# Patient Record
Sex: Male | Born: 1940 | Race: White | Hispanic: No | Smoking: Never smoker
Health system: Southern US, Community
[De-identification: ages and names within clinical notes are randomized; demographics above are authoritative.]

## PROBLEM LIST (undated history)

## (undated) DIAGNOSIS — E7800 Pure hypercholesterolemia, unspecified: Secondary | ICD-10-CM

## (undated) DIAGNOSIS — F419 Anxiety disorder, unspecified: Secondary | ICD-10-CM

## (undated) DIAGNOSIS — N200 Calculus of kidney: Secondary | ICD-10-CM

## (undated) DIAGNOSIS — M545 Low back pain, unspecified: Secondary | ICD-10-CM

## (undated) DIAGNOSIS — G8929 Other chronic pain: Secondary | ICD-10-CM

## (undated) DIAGNOSIS — K219 Gastro-esophageal reflux disease without esophagitis: Secondary | ICD-10-CM

## (undated) DIAGNOSIS — K5730 Diverticulosis of large intestine without perforation or abscess without bleeding: Secondary | ICD-10-CM

## (undated) DIAGNOSIS — K279 Peptic ulcer, site unspecified, unspecified as acute or chronic, without hemorrhage or perforation: Secondary | ICD-10-CM

## (undated) DIAGNOSIS — I10 Essential (primary) hypertension: Secondary | ICD-10-CM

## (undated) HISTORY — PX: INGUINAL HERNIA REPAIR: SHX194

## (undated) HISTORY — DX: Low back pain: M54.5

## (undated) HISTORY — DX: Peptic ulcer, site unspecified, unspecified as acute or chronic, without hemorrhage or perforation: K27.9

## (undated) HISTORY — DX: Anxiety disorder, unspecified: F41.9

## (undated) HISTORY — DX: Essential (primary) hypertension: I10

## (undated) HISTORY — PX: LEG SURGERY: SHX1003

## (undated) HISTORY — DX: Gastro-esophageal reflux disease without esophagitis: K21.9

## (undated) HISTORY — DX: Diverticulosis of large intestine without perforation or abscess without bleeding: K57.30

## (undated) HISTORY — DX: Low back pain, unspecified: M54.50

## (undated) HISTORY — DX: Pure hypercholesterolemia, unspecified: E78.00

## (undated) HISTORY — DX: Calculus of kidney: N20.0

## (undated) HISTORY — DX: Other chronic pain: G89.29

---

## 2000-04-14 ENCOUNTER — Encounter (INDEPENDENT_AMBULATORY_CARE_PROVIDER_SITE_OTHER): Payer: Self-pay | Admitting: Specialist

## 2000-04-14 ENCOUNTER — Ambulatory Visit (HOSPITAL_COMMUNITY): Admission: RE | Admit: 2000-04-14 | Discharge: 2000-04-14 | Payer: Self-pay | Admitting: Gastroenterology

## 2000-07-26 ENCOUNTER — Encounter (INDEPENDENT_AMBULATORY_CARE_PROVIDER_SITE_OTHER): Payer: Self-pay | Admitting: Specialist

## 2000-07-26 ENCOUNTER — Ambulatory Visit (HOSPITAL_COMMUNITY): Admission: RE | Admit: 2000-07-26 | Discharge: 2000-07-26 | Payer: Self-pay | Admitting: Gastroenterology

## 2000-08-08 ENCOUNTER — Ambulatory Visit (HOSPITAL_COMMUNITY): Admission: RE | Admit: 2000-08-08 | Discharge: 2000-08-08 | Payer: Self-pay | Admitting: Gastroenterology

## 2000-08-09 ENCOUNTER — Ambulatory Visit (HOSPITAL_COMMUNITY): Admission: RE | Admit: 2000-08-09 | Discharge: 2000-08-09 | Payer: Self-pay | Admitting: Gastroenterology

## 2000-08-09 ENCOUNTER — Encounter: Payer: Self-pay | Admitting: Gastroenterology

## 2002-01-16 ENCOUNTER — Encounter: Payer: Self-pay | Admitting: Urology

## 2002-01-16 ENCOUNTER — Encounter: Admission: RE | Admit: 2002-01-16 | Discharge: 2002-01-16 | Payer: Self-pay | Admitting: Urology

## 2002-11-22 ENCOUNTER — Ambulatory Visit (HOSPITAL_COMMUNITY): Admission: RE | Admit: 2002-11-22 | Discharge: 2002-11-22 | Payer: Self-pay | Admitting: Pulmonary Disease

## 2002-11-22 ENCOUNTER — Encounter: Payer: Self-pay | Admitting: Pulmonary Disease

## 2003-12-17 ENCOUNTER — Ambulatory Visit: Payer: Self-pay | Admitting: Pulmonary Disease

## 2003-12-18 ENCOUNTER — Ambulatory Visit: Payer: Self-pay | Admitting: Pulmonary Disease

## 2004-02-18 ENCOUNTER — Ambulatory Visit: Payer: Self-pay | Admitting: Pulmonary Disease

## 2004-02-28 ENCOUNTER — Ambulatory Visit: Payer: Self-pay | Admitting: Pulmonary Disease

## 2004-03-23 ENCOUNTER — Ambulatory Visit: Payer: Self-pay | Admitting: *Deleted

## 2004-03-27 ENCOUNTER — Ambulatory Visit: Payer: Self-pay | Admitting: Pulmonary Disease

## 2004-04-21 ENCOUNTER — Ambulatory Visit: Payer: Self-pay | Admitting: Pulmonary Disease

## 2004-09-21 ENCOUNTER — Ambulatory Visit: Payer: Self-pay | Admitting: Pulmonary Disease

## 2004-12-14 ENCOUNTER — Ambulatory Visit: Payer: Self-pay | Admitting: Pulmonary Disease

## 2005-03-18 ENCOUNTER — Ambulatory Visit: Payer: Self-pay | Admitting: Pulmonary Disease

## 2005-07-05 ENCOUNTER — Ambulatory Visit: Payer: Self-pay | Admitting: Pulmonary Disease

## 2005-10-08 ENCOUNTER — Ambulatory Visit: Payer: Self-pay | Admitting: Pulmonary Disease

## 2006-04-07 ENCOUNTER — Ambulatory Visit: Payer: Self-pay | Admitting: Pulmonary Disease

## 2006-04-07 LAB — CONVERTED CEMR LAB
ALT: 42 units/L — ABNORMAL HIGH (ref 0–40)
AST: 29 units/L (ref 0–37)
Albumin: 4 g/dL (ref 3.5–5.2)
Alkaline Phosphatase: 49 units/L (ref 39–117)
BUN: 17 mg/dL (ref 6–23)
Basophils Absolute: 0 10*3/uL (ref 0.0–0.1)
Basophils Relative: 0 % (ref 0.0–1.0)
Bilirubin, Direct: 0.2 mg/dL (ref 0.0–0.3)
CO2: 32 meq/L (ref 19–32)
Calcium: 9.4 mg/dL (ref 8.4–10.5)
Chloride: 108 meq/L (ref 96–112)
Cholesterol: 163 mg/dL (ref 0–200)
Creatinine, Ser: 0.8 mg/dL (ref 0.4–1.5)
Eosinophils Absolute: 0.1 10*3/uL (ref 0.0–0.6)
Eosinophils Relative: 1.1 % (ref 0.0–5.0)
GFR calc Af Amer: 124 mL/min
GFR calc non Af Amer: 102 mL/min
Glucose, Bld: 100 mg/dL — ABNORMAL HIGH (ref 70–99)
HCT: 45.4 % (ref 39.0–52.0)
HDL: 48.6 mg/dL (ref >39.0)
Hemoglobin: 15.6 g/dL (ref 13.0–17.0)
LDL Cholesterol: 103 mg/dL — ABNORMAL HIGH (ref 0–99)
Lymphocytes Relative: 30.1 % (ref 12.0–46.0)
MCHC: 34.4 g/dL (ref 30.0–36.0)
MCV: 91.4 fL (ref 78.0–100.0)
Monocytes Absolute: 0.3 10*3/uL (ref 0.2–0.7)
Monocytes Relative: 5.7 % (ref 3.0–11.0)
Neutro Abs: 3.8 10*3/uL (ref 1.4–7.7)
Neutrophils Relative %: 63.1 % (ref 43.0–77.0)
Platelets: 214 10*3/uL (ref 150–400)
Potassium: 4.8 meq/L (ref 3.5–5.1)
RBC: 4.97 M/uL (ref 4.22–5.81)
RDW: 12.6 % (ref 11.5–14.6)
Sodium: 144 meq/L (ref 135–145)
TSH: 1.69 microintl units/mL (ref 0.35–5.50)
Total Bilirubin: 1 mg/dL (ref 0.3–1.2)
Total CHOL/HDL Ratio: 3.4
Total Protein: 6.9 g/dL (ref 6.0–8.3)
Triglycerides: 55 mg/dL (ref 0–149)
VLDL: 11 mg/dL (ref 0–40)
WBC: 6 10*3/uL (ref 4.5–10.5)

## 2006-05-18 ENCOUNTER — Ambulatory Visit: Payer: Self-pay | Admitting: Pulmonary Disease

## 2006-06-02 HISTORY — PX: ROTATOR CUFF REPAIR: SHX139

## 2006-06-15 ENCOUNTER — Encounter: Admission: RE | Admit: 2006-06-15 | Discharge: 2006-08-19 | Payer: Self-pay | Admitting: Orthopedic Surgery

## 2006-10-04 ENCOUNTER — Ambulatory Visit: Payer: Self-pay | Admitting: Pulmonary Disease

## 2006-11-22 DIAGNOSIS — J309 Allergic rhinitis, unspecified: Secondary | ICD-10-CM | POA: Insufficient documentation

## 2006-11-22 DIAGNOSIS — E785 Hyperlipidemia, unspecified: Secondary | ICD-10-CM

## 2006-11-22 DIAGNOSIS — K219 Gastro-esophageal reflux disease without esophagitis: Secondary | ICD-10-CM

## 2006-11-22 DIAGNOSIS — D126 Benign neoplasm of colon, unspecified: Secondary | ICD-10-CM

## 2006-11-22 DIAGNOSIS — N200 Calculus of kidney: Secondary | ICD-10-CM

## 2006-11-22 DIAGNOSIS — Z8711 Personal history of peptic ulcer disease: Secondary | ICD-10-CM

## 2006-11-22 DIAGNOSIS — F411 Generalized anxiety disorder: Secondary | ICD-10-CM | POA: Insufficient documentation

## 2006-11-22 DIAGNOSIS — M545 Low back pain: Secondary | ICD-10-CM

## 2006-11-22 DIAGNOSIS — I10 Essential (primary) hypertension: Secondary | ICD-10-CM | POA: Insufficient documentation

## 2007-05-02 ENCOUNTER — Ambulatory Visit: Payer: Self-pay | Admitting: Pulmonary Disease

## 2007-05-02 DIAGNOSIS — K5730 Diverticulosis of large intestine without perforation or abscess without bleeding: Secondary | ICD-10-CM | POA: Insufficient documentation

## 2007-05-02 DIAGNOSIS — R0789 Other chest pain: Secondary | ICD-10-CM

## 2007-05-17 ENCOUNTER — Telehealth: Payer: Self-pay | Admitting: Pulmonary Disease

## 2007-05-28 LAB — CONVERTED CEMR LAB
Albumin: 3.9 g/dL (ref 3.5–5.2)
BUN: 23 mg/dL (ref 6–23)
Basophils Relative: 0.7 % (ref 0.0–1.0)
Calcium: 9.6 mg/dL (ref 8.4–10.5)
Creatinine, Ser: 0.7 mg/dL (ref 0.4–1.5)
Eosinophils Absolute: 0.1 10*3/uL (ref 0.0–0.7)
Eosinophils Relative: 1.7 % (ref 0.0–5.0)
GFR calc Af Amer: 144 mL/min
GFR calc non Af Amer: 119 mL/min
Glucose, Bld: 95 mg/dL (ref 70–99)
HCT: 46.6 % (ref 39.0–52.0)
HDL: 37.9 mg/dL — ABNORMAL LOW (ref >39.0)
Hemoglobin: 15.5 g/dL (ref 13.0–17.0)
MCV: 92 fL (ref 78.0–100.0)
Monocytes Absolute: 0.5 10*3/uL (ref 0.1–1.0)
Monocytes Relative: 8.5 % (ref 3.0–12.0)
Neutro Abs: 3.7 10*3/uL (ref 1.4–7.7)
Platelets: 202 10*3/uL (ref 150–400)
Potassium: 4.1 meq/L (ref 3.5–5.1)
TSH: 1.75 microintl units/mL (ref 0.35–5.50)
Total Protein: 7.1 g/dL (ref 6.0–8.3)
Triglycerides: 33 mg/dL (ref 0–149)
WBC: 6.2 10*3/uL (ref 4.5–10.5)

## 2007-06-17 ENCOUNTER — Encounter: Payer: Self-pay | Admitting: Pulmonary Disease

## 2007-06-19 ENCOUNTER — Telehealth: Payer: Self-pay | Admitting: Pulmonary Disease

## 2007-06-20 ENCOUNTER — Ambulatory Visit: Payer: Self-pay | Admitting: Pulmonary Disease

## 2007-06-23 ENCOUNTER — Telehealth: Payer: Self-pay | Admitting: Pulmonary Disease

## 2007-06-23 LAB — CONVERTED CEMR LAB
Basophils Absolute: 0 10*3/uL (ref 0.0–0.1)
Basophils Relative: 0.9 % (ref 0.0–1.0)
CO2: 30 meq/L (ref 19–32)
Calcium: 9.1 mg/dL (ref 8.4–10.5)
Chloride: 110 meq/L (ref 96–112)
Creatinine, Ser: 0.7 mg/dL (ref 0.4–1.5)
Glucose, Bld: 97 mg/dL (ref 70–99)
Hemoglobin: 12.6 g/dL — ABNORMAL LOW (ref 13.0–17.0)
Iron: 60 ug/dL (ref 42–165)
Lymphocytes Relative: 26.5 % (ref 12.0–46.0)
MCHC: 33.8 g/dL (ref 30.0–36.0)
Monocytes Relative: 8.4 % (ref 3.0–12.0)
Neutro Abs: 3.2 10*3/uL (ref 1.4–7.7)
Neutrophils Relative %: 61.4 % (ref 43.0–77.0)
RBC: 4.03 M/uL — ABNORMAL LOW (ref 4.22–5.81)
Transferrin: 238.1 mg/dL (ref >=212.0)
WBC: 5.1 10*3/uL (ref 4.5–10.5)

## 2007-07-21 ENCOUNTER — Telehealth (INDEPENDENT_AMBULATORY_CARE_PROVIDER_SITE_OTHER): Payer: Self-pay | Admitting: *Deleted

## 2007-07-21 ENCOUNTER — Ambulatory Visit: Payer: Self-pay | Admitting: Pulmonary Disease

## 2007-07-21 LAB — CONVERTED CEMR LAB
Basophils Relative: 0.2 % (ref 0.0–1.0)
Eosinophils Relative: 1.3 % (ref 0.0–5.0)
Hemoglobin: 13.8 g/dL (ref 13.0–17.0)
Lymphocytes Relative: 32.3 % (ref 12.0–46.0)
MCV: 93.8 fL (ref 78.0–100.0)
Neutro Abs: 3.4 10*3/uL (ref 1.4–7.7)
Neutrophils Relative %: 59.7 % (ref 43.0–77.0)
RBC: 4.42 M/uL (ref 4.22–5.81)
WBC: 5.8 10*3/uL (ref 4.5–10.5)

## 2007-08-23 ENCOUNTER — Telehealth (INDEPENDENT_AMBULATORY_CARE_PROVIDER_SITE_OTHER): Payer: Self-pay | Admitting: *Deleted

## 2007-08-24 ENCOUNTER — Encounter: Payer: Self-pay | Admitting: Adult Health

## 2007-08-24 ENCOUNTER — Ambulatory Visit: Payer: Self-pay | Admitting: Internal Medicine

## 2007-09-06 ENCOUNTER — Ambulatory Visit: Payer: Self-pay | Admitting: Internal Medicine

## 2007-09-07 ENCOUNTER — Ambulatory Visit: Payer: Self-pay | Admitting: Pulmonary Disease

## 2007-09-07 ENCOUNTER — Encounter: Admission: RE | Admit: 2007-09-07 | Discharge: 2007-09-07 | Payer: Self-pay | Admitting: Pulmonary Disease

## 2007-10-05 ENCOUNTER — Ambulatory Visit: Payer: Self-pay | Admitting: Pulmonary Disease

## 2008-02-16 ENCOUNTER — Encounter: Payer: Self-pay | Admitting: Pulmonary Disease

## 2008-02-29 ENCOUNTER — Encounter: Payer: Self-pay | Admitting: Pulmonary Disease

## 2008-03-01 ENCOUNTER — Telehealth: Payer: Self-pay | Admitting: Pulmonary Disease

## 2008-04-03 ENCOUNTER — Ambulatory Visit: Payer: Self-pay | Admitting: Pulmonary Disease

## 2008-04-06 LAB — CONVERTED CEMR LAB
ALT: 33 units/L (ref 0–53)
AST: 28 units/L (ref 0–37)
Albumin: 3.9 g/dL (ref 3.5–5.2)
Alkaline Phosphatase: 54 units/L (ref 39–117)
BUN: 19 mg/dL (ref 6–23)
Basophils Relative: 0.2 % (ref 0.0–3.0)
CO2: 31 meq/L (ref 19–32)
Chloride: 108 meq/L (ref 96–112)
Eosinophils Absolute: 0.1 10*3/uL (ref 0.0–0.7)
Eosinophils Relative: 1.9 % (ref 0.0–5.0)
GFR calc non Af Amer: 142 mL/min
Glucose, Bld: 108 mg/dL — ABNORMAL HIGH (ref 70–99)
HDL: 39.7 mg/dL (ref >39.0)
Lymphocytes Relative: 31.2 % (ref 12.0–46.0)
Monocytes Relative: 7.8 % (ref 3.0–12.0)
Neutrophils Relative %: 58.9 % (ref 43.0–77.0)
Platelets: 194 10*3/uL (ref 150–400)
Potassium: 4.5 meq/L (ref 3.5–5.1)
RBC: 5.02 M/uL (ref 4.22–5.81)
TSH: 1.73 microintl units/mL (ref 0.35–5.50)
Total CHOL/HDL Ratio: 3.7
VLDL: 6 mg/dL (ref 0–40)
WBC: 5.4 10*3/uL (ref 4.5–10.5)

## 2008-04-19 ENCOUNTER — Telehealth (INDEPENDENT_AMBULATORY_CARE_PROVIDER_SITE_OTHER): Payer: Self-pay | Admitting: *Deleted

## 2008-04-19 DIAGNOSIS — M25559 Pain in unspecified hip: Secondary | ICD-10-CM

## 2008-07-04 ENCOUNTER — Ambulatory Visit: Payer: Self-pay | Admitting: Pulmonary Disease

## 2008-10-01 ENCOUNTER — Ambulatory Visit: Payer: Self-pay | Admitting: Pulmonary Disease

## 2009-03-21 ENCOUNTER — Encounter: Payer: Self-pay | Admitting: Pulmonary Disease

## 2009-03-26 ENCOUNTER — Ambulatory Visit: Payer: Self-pay | Admitting: Pulmonary Disease

## 2009-03-26 ENCOUNTER — Encounter: Payer: Self-pay | Admitting: Adult Health

## 2009-03-31 ENCOUNTER — Ambulatory Visit: Payer: Self-pay | Admitting: Pulmonary Disease

## 2009-04-01 LAB — CONVERTED CEMR LAB
Basophils Relative: 0.8 % (ref 0.0–3.0)
Bilirubin, Direct: 0.1 mg/dL (ref 0.0–0.3)
Calcium: 9.4 mg/dL (ref 8.4–10.5)
Chloride: 107 meq/L (ref 96–112)
Creatinine, Ser: 0.6 mg/dL (ref 0.4–1.5)
Eosinophils Absolute: 0.1 10*3/uL (ref 0.0–0.7)
Eosinophils Relative: 1.2 % (ref 0.0–5.0)
GFR calc non Af Amer: 141.29 mL/min (ref >60)
HCT: 45.2 % (ref 39.0–52.0)
HDL: 53.7 mg/dL (ref >39.00)
Hemoglobin: 14.9 g/dL (ref 13.0–17.0)
LDL Cholesterol: 139 mg/dL — ABNORMAL HIGH (ref 0–99)
MCHC: 33.1 g/dL (ref 30.0–36.0)
MCV: 93.6 fL (ref 78.0–100.0)
Monocytes Absolute: 0.4 10*3/uL (ref 0.1–1.0)
Neutro Abs: 3 10*3/uL (ref 1.4–7.7)
RBC: 4.82 M/uL (ref 4.22–5.81)
Total Bilirubin: 0.7 mg/dL (ref 0.3–1.2)
Total CHOL/HDL Ratio: 4
Triglycerides: 34 mg/dL (ref 0.0–149.0)
VLDL: 6.8 mg/dL (ref 0.0–40.0)
WBC: 5.4 10*3/uL (ref 4.5–10.5)

## 2009-04-02 ENCOUNTER — Ambulatory Visit (HOSPITAL_COMMUNITY): Admission: RE | Admit: 2009-04-02 | Discharge: 2009-04-02 | Payer: Self-pay | Admitting: Gastroenterology

## 2009-04-25 ENCOUNTER — Encounter: Payer: Self-pay | Admitting: Pulmonary Disease

## 2009-05-21 ENCOUNTER — Telehealth: Payer: Self-pay | Admitting: Pulmonary Disease

## 2009-07-03 ENCOUNTER — Ambulatory Visit: Payer: Self-pay | Admitting: Pulmonary Disease

## 2009-08-22 ENCOUNTER — Encounter: Payer: Self-pay | Admitting: Pulmonary Disease

## 2009-09-01 HISTORY — PX: TOTAL SHOULDER ARTHROPLASTY: SHX126

## 2009-09-11 ENCOUNTER — Ambulatory Visit (HOSPITAL_COMMUNITY): Admission: RE | Admit: 2009-09-11 | Discharge: 2009-09-12 | Payer: Self-pay | Admitting: Orthopedic Surgery

## 2009-12-05 ENCOUNTER — Ambulatory Visit: Payer: Self-pay | Admitting: Pulmonary Disease

## 2009-12-07 LAB — CONVERTED CEMR LAB
ALT: 22 units/L (ref 0–53)
Albumin: 4.1 g/dL (ref 3.5–5.2)
Alkaline Phosphatase: 77 units/L (ref 39–117)
Bilirubin, Direct: 0.1 mg/dL (ref 0.0–0.3)
CO2: 29 meq/L (ref 19–32)
Calcium: 9.3 mg/dL (ref 8.4–10.5)
Chloride: 107 meq/L (ref 96–112)
Creatinine, Ser: 0.6 mg/dL (ref 0.4–1.5)
Glucose, Bld: 94 mg/dL (ref 70–99)
HDL: 40.9 mg/dL (ref >39.00)
Sodium: 143 meq/L (ref 135–145)
Total CHOL/HDL Ratio: 4
Total Protein: 7 g/dL (ref 6.0–8.3)

## 2010-01-23 ENCOUNTER — Observation Stay (HOSPITAL_COMMUNITY)
Admission: EM | Admit: 2010-01-23 | Discharge: 2010-01-23 | Payer: Self-pay | Source: Home / Self Care | Admitting: Emergency Medicine

## 2010-01-23 ENCOUNTER — Encounter (INDEPENDENT_AMBULATORY_CARE_PROVIDER_SITE_OTHER): Payer: Self-pay | Admitting: Emergency Medicine

## 2010-02-05 ENCOUNTER — Ambulatory Visit
Admission: RE | Admit: 2010-02-05 | Discharge: 2010-02-05 | Payer: Self-pay | Source: Home / Self Care | Attending: Pulmonary Disease | Admitting: Pulmonary Disease

## 2010-02-05 DIAGNOSIS — R42 Dizziness and giddiness: Secondary | ICD-10-CM | POA: Insufficient documentation

## 2010-03-03 NOTE — Assessment & Plan Note (Signed)
Summary: 6 months/apc   CC:  8 month ROV & review of mult medical problems....  History of Present Illness: 70 y/o WM here for a follow up visit... he has mult med probs as noted below...    ~  Aug09: seen w/ left chest discomfort, mult somatic complaints, and anxiety... he thought his left breast was larger than the right & tender on palp- and he wanted eval... subseq CXR, Mammogram, & Sonar were all WNL.Marland Kitchen. he feels good about the thorough eval and all symptoms have resolved...  ~  Sep09: he has had a diverticular bleed and was adm to [facility] 5/14 - 06/17/07... this bleed was self-limited and he didn't require transfusion (Hg 11-12 range)... he was also found to have pos HPylori serology and treated w/ PrevPak x 14d... f/u w/ [name]- no change in GI treatment...   ~  Mar10:  he saw [facility] in Jan w/ dysphagia- repeat EGD showed HH, erosive esophagitis & he was dilated & placed on Prevacid Bid- improved... feels well overall but still somewhat fixated on his breast stating that his right breast itches some- no nodules, no mass, etc... repeat breast exam is totally neg... I suggested thaty he increase his Prn Alprazolam...    ~  March 31, 2009:  recent eval by [facility] for vague abd symptoms- prev Sonar neg & HIDA planned... had superfic infection around navel treated w/ Keflex/ cortisporin & improved... BP stable on Rx;  Chol has been well controlled on Simva40;  he tells me he will cont to f/u DrRDavis at [facility]...   ~  December 05, 2009:  he fell while standing in the bed of his truck 8/11 w/ left humerus fx & surg [name]- lots of rehab & still w/ decr ROM (he's left handed)... BP controlled;  remains on diet & Simva40 for Chol;  GI has been stable;  and Alpraz helps nerves...  OK Flu vaccine today & check fasting labs.    Current Problem List:  ALLERGIC RHINITIS (ICD-477.9) - uses OTC meds Prn...  HYPERTENSION (ICD-401.9) - on LOTREL 10-20/d... BP=116/72 today and similar at home as  well... denies HA, fatigue, visual changes, CP, palipit, dizziness, syncope, dyspnea, edema, etc...  CHEST PAIN, ATYPICAL (ICD-786.59) - hx CP and tachy palpit in 2003 w/ neg cardiac eval from [name]... he is on ASA 81mg /d, and asymptomatic currently.  ~  NuclearStressTest 6/03 showed no infarct, ? mild ischemia inferiorly, EF= 57%...  ~  [name] was negative without stress induced wall motion abn...  HYPERCHOLESTEROLEMIA (ICD-272.0) - on SIMVASTATIN 40mg /d + FISH OIL 1000mg /d...  ~  FLP 3/09 showed TChol 157, TG 33, HDL 38, LDL 113  ~  FLP 3/10 showed TChol 145, TG 32, HDL 40, LDL 99  ~  FLP 2/11 showed TChol 199, TG 34, HDL 54, LDL 139... rec> same med, better diet, get wt down.  ~  FLP 11/11 showed TChol 168, TG 50, HDL 41, LDL 117  GERD (ICD-530.81) & PUD, HX OF (ICD-V12.71) - on PREVACID 30mg Bid now ... followed by [facility] for GI- as above.  ~  EGD 2004 showed large HH & schatzki ring...  ~  AbdSonar 10/04 was WNL...  ~  EDG 1/10 repeated for dysphagia w/ HH, schatzki ring, erosive esophagitis...dilated & PPI incr to Bid...  DIVERTICULOSIS OF COLON (ICD-562.10) & COLONIC POLYPS (ICD-211.3) - GI = [name] and pt reports seen 3/09 w/ colonoscopy showing 4 polyps... f/u planned 68yrs.  ~  adm to [name] 5/09 w/ lower  GI bleed likely from [employer]- self-limited, didn't req Tx etc...  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  ~  pt reports PSA 3/10 was <2  ~  labs here 2/11 showed PSA= 1.13  OSTEOARTHRITIS & ORTHOPEDIC ISSUES: LOW BACK PAIN, CHRONIC (ICD-724.2) - he has a hx of LBP and FM-like symptoms... states doing well w/ exercise program.  ~  8/11:  fall from bed of truck w/ left prox humerus fx & surg [name]- lots of rehab but still has decr ROM shoulder.  ANXIETY (ICD-300.00) - takes ALPRAZOLAM 0.5mg  Prn...  Health Maintenance - up to date on colonoscopy and prostate evals... he had TETANUS shot 2003, and PNEUMOVAX at  age 62 in 2003- repeat PNEUMOVAX given 2/11 at age 70... he gets yearly Flu shots as well...   Preventive Screening-Counseling & Management  Alcohol-Tobacco     Smoking Status: never  Allergies (verified): No Known Drug Allergies  Comments:  Nurse/Medical Assistant: The patient's medications and allergies were reviewed with the patient and were updated in the Medication and Allergy Lists.  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 father died at 84 from CHF mother died at 44 from pneumonia 4 siblings all in good health  Social History: Reviewed history from 10/05/2007 and no changes required. never smoked does not exercise 1 cup of coffee a day married 4 children no etoh  Review of Systems      See HPI       The patient complains of dyspnea on exertion.  The patient denies anorexia, fever, weight loss, weight gain, vision loss, decreased hearing, hoarseness, chest pain, syncope, peripheral edema, prolonged cough, headaches, hemoptysis, abdominal pain, melena, hematochezia, severe indigestion/heartburn, hematuria, incontinence, muscle weakness, suspicious skin lesions, transient blindness, difficulty walking, depression, unusual weight change, abnormal bleeding, enlarged lymph nodes, and angioedema.         His CC= left shoulder discomfort...  Vital Signs:  Patient profile:   70 year old male Height:      69  inches Weight:      162.25 pounds BMI:     24.05 O2 Sat:      96 % on Room air Temp:     97.2 degrees F oral Pulse rate:   65 / minute BP sitting:   116 / 72  (right arm) Cuff size:   regular  Vitals Entered By: Randell Loop CMA (December 05, 2009 11:22 AM)  O2 Sat at Rest %:  96 O2 Flow:  Room air CC: 8 month ROV & review of mult medical problems... Is Patient Diabetic? No Pain Assessment Patient in pain? yes      Onset of pain  with the left shoulder at times Comments meds updated today with pt   Physical Exam  Additional Exam:  WD, WN, 70 y/o WM in NAD... GENERAL:  Alert & oriented; pleasant & cooperative... HEENT:  [place]/AT, EOM-full, PERRLA, EACs-clear, TMs-wnl, NOSE-clear, THROAT-clear & wnl. NECK:  Supple w/ fairROM; no JVD; normal carotid impulses w/o bruits; no  thyromegaly or nodules palpated; no lymphadenopathy. CHEST:  Clear to P & A; without wheezes/ rales/ or rhonchi heard...  HEART:  Regular Rhythm; without murmurs/ rubs/ or gallops detected... ABDOMEN:  Soft & nontender; normal bowel sounds; no organomegaly or masses palpated... EXT: without deformities, mild arthritic changes; no varicose veins/ venous insuffic/ or edema. NEURO:  CN's intact; motor testing normal; sensory testing normal; gait normal & balance OK. DERM:  No lesions noted; periumbil rash improved...    MISC. Report  Procedure date:  12/05/2009  Findings:      BMP (METABOL)   Sodium                    143 mEq/L                   135-145   Potassium                 4.4 mEq/L                   3.5-5.1   Chloride                  107 mEq/L                   96-112   Carbon Dioxide            29 mEq/L                    19-32   Glucose                   94 mg/dL                    16-10   BUN                  [H]  25 mg/dL                    9-60   Creatinine                0.6 mg/dL                   4.5-4.0   Calcium                   9.3 mg/dL                   9.8-11.9   GFR                        135.78 mL/min               >60  Hepatic/Liver Function Panel (HEPATIC)   Total Bilirubin           0.6 mg/dL                   1.4-7.8   Direct Bilirubin          0.1 mg/dL                   2.9-5.6   Alkaline Phosphatase      77 U/L                      39-117   AST  19 U/L                      0-37   ALT                       22 U/L                      0-53   Total Protein             7.0 g/dL                    2.7-2.5   Albumin                   4.1 g/dL                    3.6-6.4  Lipid Panel (LIPID)   Cholesterol               168 mg/dL                   4-034   Triglycerides             50.0 mg/dL                  7.4-259.5   HDL                       63.87 mg/dL                 >56.43   LDL Cholesterol      [H]  329 mg/dL                   5-18   Impression & Recommendations:  Problem # 1:  LEFT SHOULDER PAIN>>> s/p fall w/ prox humerus fx 7 surg by [name]... continue the PT, using Tylenol, and he will let us know if he needs stronger pain med...  Problem # 2:  HYPERTENSION (ICD-401.9) Controlled>  same meds. His updated medication list for this problem includes:    Amlodipine Besy-benazepril Hcl 10-20 Mg Caps (Amlodipine besy-benazepril hcl) .Marland Kitchen... Take 1 tab by mouth once daily...  Orders: TLB-BMP (Basic Metabolic Panel-BMET) (80048-METABOL) TLB-Hepatic/Liver Function Pnl (80076-HEPATIC) TLB-Lipid Panel (80061-LIPID)  Problem # 3:  HYPERCHOLESTEROLEMIA (ICD-272.0) Stable on the Simva40 + diet & could always use better diet!!! His updated medication list for this problem includes:    Simvastatin 40 Mg Tabs (Simvastatin) .Marland Kitchen... Take 1 tab  by mouth once daily  Problem # 4:  PUD, HX OF (ICD-V12.71) Hx chr upper GI problems & stable on the Prev Bid... His updated medication list for this problem includes:    Prevacid 30 Mg Cpdr (Lansoprazole) .Marland Kitchen... Take 1 cap by mouth two times a day...  Problem # 5:  DIVERTICULOSIS OF COLON  (ICD-562.10) Hs chr lower GI problems and followed by [name]...  Problem # 6:  ANXIETY (ICD-300.00) Stable w/ the Benzo Rx... continue same. His updated medication list for this problem includes:    Alprazolam 0.5 Mg Tabs (Alprazolam) .Marland Kitchen... Take 1/2 to 1 tab by mouth three times a day as needed for nerves...  Problem # 7:  OTHER MEDICAL PROBLEMS AS NOTED>>> OK Flu vaccine today...  Complete Medication List: 1)  Ecotrin Low Strength 81 Mg Tbec (Aspirin) .... Take 1 tab by mouth once daily.Marland KitchenMarland Kitchen 2)  Amlodipine Besy-benazepril Hcl 10-20 Mg Caps (Amlodipine besy-benazepril  hcl) .... Take 1 tab by mouth once daily.Marland KitchenMarland Kitchen 3)  Simvastatin 40 Mg Tabs (Simvastatin) .... Take 1 tab  by mouth once daily 4)  Fish Oil 1000 Mg Caps (Omega-3 fatty acids) .... Take 1 cap by mouth once daily.Marland KitchenMarland Kitchen 5)  Prevacid 30 Mg Cpdr (Lansoprazole) .... Take 1 cap by mouth two times a day... 6)  Alprazolam 0.5 Mg Tabs (Alprazolam) .... Take 1/2 to 1 tab by mouth three times a day as needed for nerves...  Other Orders: Influenza Vaccine MCR (29562)  Patient Instructions: 1)  Today we updated your med list- see below.... 2)  Continue your current meds the same... 3)  Today we did your follow up Fasting lipid profile... please call the "phone tree" in a few days for your lab results.Marland KitchenMarland Kitchen 4)  Good luck w/ the phys therapy on your left shoulder! 5)  We gave you the 2011 flu vaccine today. 6)  Call for any problems.Marland KitchenMarland Kitchen 7)  Please schedule a follow-up appointment in 6 months.   Immunizations Administered:  Influenza Vaccine # 1:    Vaccine Type: Fluvax MCR    Site: right deltoid    Mfr: novartis    Dose: 0.5 ml    Route: IM    Given by: Randell Loop CMA    Exp. Date: 07/2010    Lot #: [id number]    VIS given: 08/26/09 version given December 05, 2009.

## 2010-03-03 NOTE — Assessment & Plan Note (Signed)
Summary: NP follow up   CC:  still having drainage from navel that has no color, but does have a foul odor.  states is better since last OV.  also states removed tick from right side of abdomen that is red, and itches and raised.Mark Jordan  History of Present Illness: 70 y/o WM with known history of HTN, hyperlipidemia, GERD  08/24/07 presented  for 2 weeks of chest tightness, tenderness along anterior chest wall, belching, gas and abdominal bloating. Felt to be GERD. prevacid increased two times a day and pepcid added. fish oil held, gas x recommended as needed .     ~  Aug09 seen w/ left chest discomfort, mult somatic complaints, and anxiety... he thought his left breast was larger than the right & tender on palp- and he wanted eval... subseq CXR, Mammogram, & Sonar were all WNL.Mark Jordan. he feels good about the thorough eval and all symptoms have resolved...   ~  April 03, 2008:  he saw [facility] in Jan w/ dysphagia- repeat EGD showed HH, erosive esophagitis & he was dilated & placed on Prevacid Bid... he is improved... feels well overall but still somewhat fixated on his breats stating that his right breast itches some- no nodules, no mass, etc... repeat breast exam is totally neg... I suggested thaty he increase his Prn Alprazolam...    ~July 04, 2008 --Presents for work in visit for pain in ribcage under the axillary area - states is achy and inconsistent x2-54months. Sore to touch, hurts to move right arm up and down. Comes and goes, some days worse than other. No rash, no known injury. Does work on farm, moving heavy fences.  Denies chest pain, dyspnea, orthopnea, hemoptysis, fever, n/v/d, edema, headache, known injury, no abd pain, bloody stools, urinary symptoms, wt loss, syncope.   March 26, 2009--Presents for an acute office visit. Complains of 2 months of redness around navel, burning. Along umbilicus has been sore for last 2 months-looks slightly irritated on/off. For last 2 weeks has been red,  irritatied, tender and puffy. No known injury, Has been trying to clean, looked to see if something was in it but could not identify anything. Denies chest pain, dyspnea, orthopnea, hemoptysis, fever, n/v/d, edema, headache,recent travel or antibiotics, abd pain, urinary symptoms     ~  March 31, 2009:  recent eval by [facility] for vague abd symptoms- prev Sonar neg & HIDA planned... had superfic infection around navel treated w/ Keflex/ cortisporin & improved... BP stable on Rx;  Chol has been well controlled on Simva40;  he tells me he will cont to f/u DrRDavis at [facility]...  July 03, 2009--Present for work in visit for couple of issues. Was seen 3 months ago for superficial cellulitis along umbilicus-wound cx pansensitive e.coli. He complains of still having drainage from navel that has no color, but does have a foul odor.  states is better since last OV but did not resovle. Now it is still red and irritated. Also , removed tick from right side of abdomen that is red, itches and raised.2 days ago. 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 Denies chest pain, dyspnea, orthopnea, hemoptysis, fever, n/v/d, edema, headache, joint pain/swelling or other rash. 70 y/o WM here for a follow up visit... he has mult med probs as noted below...    ~  Aug09: seen w/ left chest discomfort, mult somatic complaints, and anxiety... he thought his left breast was larger than  the right & tender on palp- and he wanted eval... subseq CXR, Mammogram, & Sonar were all WNL.Mark Jordan. he feels good about the thorough eval and all symptoms have resolved...  ~  Sep09: he has had a diverticular bleed and was adm to [facility] 5/14 - 06/17/07... this bleed was self-limited and he didn't require transfusion (Hg 11-12 range)... he was also found to have pos HPylori serology and treated w/ PrevPak x 14d... f/u w/ [name]- no change in GI treatment...   ~  Mar10:  he saw [facility] in Jan w/ dysphagia- repeat EGD showed HH,  erosive esophagitis & he was dilated & placed on Prevacid Bid- improved... feels well overall but still somewhat fixated on his breast stating that his right breast itches some- no nodules, no mass, etc... repeat breast exam is totally neg... I suggested thaty he increase his Prn Alprazolam...    ~  October 01, 2008:  he's had a good 6 months- no new complaints or concerns... stable on current med Rx...   ~  March 31, 2009:  recent eval by [facility] for vague abd symptoms- prev Sonar neg & HIDA planned... had superfic infection around navel treated w/ Keflex/ cortisporin & improved... BP stable on Rx;  Chol has been well controlled on Simva40;  he tells me he will cont to f/u DrRDavis at [facility]...    Current Problem List:  ALLERGIC RHINITIS (ICD-477.9) - uses OTC meds Prn...  HYPERTENSION (ICD-401.9) - on LOTREL 10-20/d... BP=118/72 today and similar at home as well... denies HA, fatigue, visual changes, CP, palipit, dizziness, syncope, dyspnea, edema, etc...     Preventive Screening-Counseling & Management  Alcohol-Tobacco     Smoking Status: never  Medications Prior to Update: 1)  Ecotrin Low Strength 81 Mg  Tbec (Aspirin) .... Take 1 Tab By Mouth Once Daily.Mark KitchenMarland Jordan 2)  Amlodipine Besy-Benazepril Hcl 10-20 Mg Caps (Amlodipine Besy-Benazepril Hcl) .... Take 1 Tab By Mouth Once Daily.Mark KitchenMarland Jordan 3)  Simvastatin 40 Mg  Tabs (Simvastatin) .... Take 1 Tab  By Mouth Once Daily 4)  Fish Oil 1000 Mg Caps (Omega-3 Fatty Acids) .... Take 1 Cap By Mouth Once Daily.Mark KitchenMarland Jordan 5)  Prevacid 30 Mg  Cpdr (Lansoprazole) .... Take 1 Cap By Mouth Two Times A Day... 6)  Alprazolam 0.5 Mg  Tabs (Alprazolam) .... Take 1/2 To 1 Tab By Mouth Three Times A Day As Needed For Nerves.Mark KitchenMarland Jordan 7)  Keflex 500 Mg Caps (Cephalexin) .Mark Jordan.. 1 By Mouth Four Times A Day 8)  Cortisporin 0.5-0.5-10000 Crea (Neomycin-Polymyxin-Hc) .... Apply To Area Two Times A Day For 7 Days  Current Medications (verified): 1)  Ecotrin Low Strength 81 Mg  Tbec  (Aspirin) .... Take 1 Tab By Mouth Once Daily.Mark KitchenMarland Jordan 2)  Amlodipine Besy-Benazepril Hcl 10-20 Mg Caps (Amlodipine Besy-Benazepril Hcl) .... Take 1 Tab By Mouth Once Daily.Mark KitchenMarland Jordan 3)  Simvastatin 40 Mg  Tabs (Simvastatin) .... Take 1 Tab  By Mouth Once Daily 4)  Fish Oil 1000 Mg Caps (Omega-3 Fatty Acids) .... Take 1 Cap By Mouth Once Daily.Mark KitchenMarland Jordan 5)  Prevacid 30 Mg  Cpdr (Lansoprazole) .... Take 1 Cap By Mouth Two Times A Day... 6)  Alprazolam 0.5 Mg  Tabs (Alprazolam) .... Take 1/2 To 1 Tab By Mouth Three Times A Day As Needed For Nerves...  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) LOW BACK PAIN, CHRONIC (ICD-724.2) ANXIETY (ICD-300.00)  Past Surgical History: Last updated: 03/31/2009 S/P left inguinal hernia repair S/P left rotator cuff surgery - 5/08 by [name]  Family History: Last updated: 10/31/07 father died at 78 from CHF mother died at 42 from pneumonia 4 siblings all in good health  Social History: Last updated: Oct 31, 2007 never smoked does not exercise 1 cup of coffee a day married 4 children no etoh  Risk Factors: Smoking Status: never (07/03/2009)  Review of Systems      See HPI  Vital Signs:  Patient profile:   70 year old male Height:      69 inches Weight:      166 pounds BMI:     24.60 O2 Sat:      96 % on Room air Temp:     98.2 degrees F oral Pulse rate:   53 / minute BP sitting:   116 / 80  (left arm) Cuff size:   regular  Vitals Entered By: Boone Master CNA/MA (July 03, 2009 2:22 PM)  O2 Flow:  Room air CC: still having drainage from navel that has no color, but does have a foul odor.  states is better since  last OV.  also states removed tick from right side of abdomen that is red, itches and raised. Is Patient Diabetic? No Comments Medications reviewed with patient Daytime contact number verified with patient. Boone Master CNA/MA  July 03, 2009 2:23 PM    Physical Exam  Additional Exam:  WD, WN, 70  y/o WM in NAD... GENERAL:  Alert & oriented; pleasant & cooperative... HEENT:  [place]/AT,  EACs-clear, TMs-wnl, NOSE-clear, THROAT-clear & wnl. NECK:  Supple w/ fairROM; no JVD; normal carotid impulses w/o bruits; no thyromegaly or nodules palpated; no lymphadenopathy. CHEST:  Clear to P & A; without wheezes/ rales/ or rhonchi heard...  HEART:  Regular Rhythm; without murmurs/ rubs/ or gallops detected... ABDOMEN:  Soft & nontender; normal bowel sounds; no organomegaly or masses palpated, no guarding or rebound, umbilicu red, swollen.  EXT: without deformities, mild arthritic changes; no varicose veins/ venous insuffic/ or edema. NEURO:  CN's intact; motor testing normal; sensory testing normal; gait normal & balance OK. DERM:  umbilicus red, swollen, irritated no drainage noted.  along mid right abd red raised papule (from tick bite)     Impression & Recommendations:  Problem # 1:  CELLULITIS, ABDOMEN (ICD-682.2) Previous pansensitive e.coli superficial cellulitis -umbilicus Tx w/ keflex x 7 days. Unclear why this did not heal.  He is leving on trip for 3-4 weeks. Will refer to dermatology to evalaute.  also tx empriically for tick bite w/ doxcycylne x 7 days.  Tick protecitobn. advised.  The following medications were removed from the medication list:    Keflex 500 Mg Caps (Cephalexin) .Mark Jordan... 1 by mouth four times a day His updated medication list for this problem includes:    Zithromax Z-pak 250 Mg Tabs (Azithromycin) .Mark Jordan... Take as directed.    Doxycycline Hyclate 100 Mg Caps (Doxycycline hyclate) .Mark Jordan... 1 by mouth two times a day  Orders: Est. Patient Level IV (09811) Dermatology  Referral (Derma)  Complete Medication List: 1)  Ecotrin Low Strength 81 Mg Tbec (Aspirin) .... Take 1 tab by mouth once daily.Mark KitchenMarland Jordan 2)  Amlodipine Besy-benazepril Hcl 10-20 Mg Caps (Amlodipine besy-benazepril hcl) .... Take 1 tab by mouth once daily.Mark KitchenMarland Jordan 3)  Simvastatin 40 Mg Tabs (Simvastatin) .... Take 1 tab  by mouth once daily 4)  Fish Oil 1000 Mg Caps (Omega-3 fatty acids) .... Take 1 cap by mouth once daily.Mark KitchenMarland Jordan 5)  Prevacid 30 Mg Cpdr (Lansoprazole) .... Take 1 cap by mouth two times a day... 6)  Alprazolam 0.5 Mg Tabs (Alprazolam) .... Take 1/2 to 1 tab by mouth three times a day as needed for nerves.Mark KitchenMarland Jordan 7)  Zithromax Z-pak 250 Mg Tabs (Azithromycin) .... Take as directed. 8)  Doxycycline Hyclate 100 Mg Caps (Doxycycline hyclate) .Mark Jordan.. 1 by mouth two times a day  Patient Instructions: 1)  We are referring you to Dermatology to evauate irritation along navel.  2)  Protect yourself against ticks w/ spray and clothing.  3)  Zyrtec 10mg  at bedtime as needed skin irriation /itching x 3 days.  4)  Clean tick bite w/ soap and water , watch for rash.  5)  Doxycycline 100mg  two times a day for 7 days  6)  Please contact office for sooner follow up if symptoms do not improve or worsen  Prescriptions: DOXYCYCLINE HYCLATE 100 MG CAPS (DOXYCYCLINE HYCLATE) 1 by mouth two times a day  #14 x 0   Entered and Authorized by:   Rubye Oaks NP   Signed by:   Rubye Oaks NP on 07/03/2009   Method used:   Electronically to        [facility] (retail)       [address]       [place], [place]  [id number]       Ph: [phone]       Fax: [phone]   RxID:   [id number] ZITHROMAX Z-PAK 250 MG TABS (AZITHROMYCIN) take as directed.  #1 x 0   Entered and Authorized by:   Rubye Oaks NP   Signed by:   Tammy Parrett NP on 07/03/2009   Method used:   Print then Give to Patient   RxID:   [id number]

## 2010-03-03 NOTE — Progress Notes (Signed)
Summary: prescription  Phone Note Call from Patient Call back at [phone]   Caller: Patient Call For: Niang Mitcheltree Summary of Call: need simvastin ( cholestrol med) sent to [facility] for 90 day supply Initial call taken by: Rickard Patience,  May 21, 2009 2:53 PM  Follow-up for Phone Call        rx sent. pt aware.Carron Curie CMA  May 21, 2009 2:58 PM     Prescriptions: SIMVASTATIN 40 MG  TABS (SIMVASTATIN) take 1 tab  by mouth once daily  #90 x 4   Entered by:   Carron Curie CMA   Authorized by:   Michele Mcalpine MD   Signed by:   Carron Curie CMA on 05/21/2009   Method used:   Faxed to ...       [facility] (mail-order)             ,          Ph: [phone]       Fax: [phone]   RxID:   [id number]

## 2010-03-03 NOTE — Letter (Signed)
[facility]   Imported By: Lester [place] 04/11/2009 07:44:03  _____________________________________________________________________  External Attachment:    Type:   Image     Comment:   External Document

## 2010-03-03 NOTE — Letter (Signed)
Summary: [facility]   Imported By: Sherian Rein 05/08/2009 11:46:57  _____________________________________________________________________  External Attachment:    Type:   Image     Comment:   External Document

## 2010-03-03 NOTE — Assessment & Plan Note (Signed)
Summary: Acute NP office visit    CC:  redness around navel and burning.  History of Present Illness: 70 y/o WM with known history of HTN, hyperlipidemia, GERD  08/24/07 presented  for 2 weeks of chest tightness, tenderness along anterior chest wall, belching, gas and abdominal bloating. Felt to be GERD. prevacid increased two times a day and pepcid added. fish oil held, gas x recommended as needed .     ~  Aug09 seen w/ left chest discomfort, mult somatic complaints, and anxiety... he thought his left breast was larger than the right & tender on palp- and he wanted eval... subseq CXR, Mammogram, & Sonar were all WNL.Marland Kitchen. he feels good about the thorough eval and all symptoms have resolved...   ~  April 03, 2008:  he saw [facility] in Jan w/ dysphagia- repeat EGD showed HH, erosive esophagitis & he was dilated & placed on Prevacid Bid... he is improved... feels well overall but still somewhat fixated on his breats stating that his right breast itches some- no nodules, no mass, etc... repeat breast exam is totally neg... I suggested thaty he increase his Prn Alprazolam...    ~July 04, 2008 --Presents for work in visit for pain in ribcage under the axillary area - states is achy and inconsistent x2-85months. Sore to touch, hurts to move right arm up and down. Comes and goes, some days worse than other. No rash, no known injury. Does work on farm, moving heavy fences.  Denies chest pain, dyspnea, orthopnea, hemoptysis, fever, n/v/d, edema, headache, known injury, no abd pain, bloody stools, urinary symptoms, wt loss, syncope.   March 26, 2009--Presents for an acute office visit. Complains of 2 months of redness around navel, burning. Along umbilicus has been sore for last 2 months-looks slightly irritated on/off. For last 2 weeks has been red, irritatied, tender and puffy. No known injury, Has been trying to clean, looked to see if something was in it but could not identify anything. Denies chest pain,  dyspnea, orthopnea, hemoptysis, fever, n/v/d, edema, headache,recent travel or antibiotics, abd pain, urinary symptoms     Medications Prior to Update: 1)  Ecotrin Low Strength 81 Mg  Tbec (Aspirin) .... Take 1 Tab By Mouth Once Daily.Marland KitchenMarland Kitchen 2)  Amlodipine Besy-Benazepril Hcl 10-20 Mg Caps (Amlodipine Besy-Benazepril Hcl) .... Take 1 Tab By Mouth Once Daily.Marland KitchenMarland Kitchen 3)  Simvastatin 40 Mg  Tabs (Simvastatin) .... Take 1 Tab  By Mouth Once Daily 4)  Fish Oil 1000 Mg Caps (Omega-3 Fatty Acids) .... Take 1 Cap By Mouth Once Daily.Marland KitchenMarland Kitchen 5)  Prevacid 30 Mg  Cpdr (Lansoprazole) .... Take 1 Cap By Mouth Two Times A Day... 6)  Alprazolam 0.5 Mg  Tabs (Alprazolam) .... Take 1/2 To 1 Tab By Mouth Three Times A Day As Needed For Nerves...  Current Medications (verified): 1)  Ecotrin Low Strength 81 Mg  Tbec (Aspirin) .... Take 1 Tab By Mouth Once Daily.Marland KitchenMarland Kitchen 2)  Amlodipine Besy-Benazepril Hcl 10-20 Mg Caps (Amlodipine Besy-Benazepril Hcl) .... Take 1 Tab By Mouth Once Daily.Marland KitchenMarland Kitchen 3)  Simvastatin 40 Mg  Tabs (Simvastatin) .... Take 1 Tab  By Mouth Once Daily 4)  Fish Oil 1000 Mg Caps (Omega-3 Fatty Acids) .... Take 1 Cap By Mouth Once Daily.Marland KitchenMarland Kitchen 5)  Prevacid 30 Mg  Cpdr (Lansoprazole) .... Take 1 Cap By Mouth Two Times A Day... 6)  Alprazolam 0.5 Mg  Tabs (Alprazolam) .... Take 1/2 To 1 Tab By Mouth Three Times A Day As Needed For Nerves.Marland KitchenMarland Kitchen  Allergies (verified): No Known Drug Allergies  Past History:  Past Medical History: Last updated: 10/01/2008 ALLERGIC RHINITIS (ICD-477.9) HYPERTENSION (ICD-401.9) CHEST PAIN, ATYPICAL (ICD-786.59) HYPERCHOLESTEROLEMIA (ICD-272.0) GERD (ICD-530.81) PUD, HX OF (ICD-V12.71) DIVERTICULOSIS OF COLON (ICD-562.10) COLONIC POLYPS (ICD-211.3) Hx of CALCULUS, KIDNEY (ICD-592.0) LOW BACK PAIN, CHRONIC (ICD-724.2) ANXIETY (ICD-300.00)  Past Surgical History: Last updated: 10/01/2008 S/P left inguinal hernia repair S/P left rotator cuff surgery - 5/08 by [name]  Family  History: Last updated: October 07, 2007 father died at 60 from CHF mother died at 87 from pneumonia 4 siblings all in good health  Social History: Last updated: 07-Oct-2007 never smoked does not exercise 1 cup of coffee a day married 4 children no etoh  Risk Factors: Smoking Status: never (04/03/2008)  Review of Systems      See HPI  Vital Signs:  Patient profile:   70 year old male Height:      69 inches Weight:      168.25 pounds BMI:     24.94 O2 Sat:      96 % on Room air Temp:     98.4 degrees F oral Pulse rate:   58 / minute BP sitting:   144 / 80  (left arm) Cuff size:   regular  Vitals Entered By: Boone Master CNA (March 26, 2009 11:39 AM)  O2 Flow:  Room air CC: redness around navel, burning Is Patient Diabetic? No Comments Medications reviewed with patient Daytime contact number verified with patient. Boone Master CNA  March 26, 2009 11:39 AM    Physical Exam  Additional Exam:  WD, WN, 70  y/o WM in NAD... GENERAL:  Alert & oriented; pleasant & cooperative... HEENT:  [place]/AT,  EACs-clear, TMs-wnl, NOSE-clear, THROAT-clear & wnl. NECK:  Supple w/ fairROM; no JVD; normal carotid impulses w/o bruits; no thyromegaly or nodules palpated; no lymphadenopathy. CHEST:  Clear to P & A; without wheezes/ rales/ or rhonchi heard...  HEART:  Regular Rhythm; without murmurs/ rubs/ or gallops detected... ABDOMEN:  Soft & nontender; normal bowel sounds; no organomegaly or masses palpated, no guarding or rebound, umbilicu red, swollen.  EXT: without deformities, mild arthritic changes; no varicose veins/ venous insuffic/ or edema. NEURO:  CN's intact; motor testing normal; sensory testing normal; gait normal & balance OK. DERM:  umbilicus red, swollen, irritated      Impression & Recommendations:  Problem # 1:  CELLULITIS, ABDOMEN (ICD-682.2)  ?umbilicus cellulitis, ? etiology.  REC:  Keflex 500mg  four times a day for 7 days Wash navel gentle w/ soap/water and  pat dry Apply cortisporin to area two times a day w/ qtip.  Warm compresses  two times a day  follow up 2 weeks  Please contact office for sooner follow up if symptoms do not improve or worsen   His updated medication list for this problem includes:    Keflex 500 Mg Caps (Cephalexin) .Marland Kitchen... 1 by mouth four times a day  Orders: T-Culture, Wound ([id number]) Est. Patient Level IV (16109)  Medications Added to Medication List This Visit: 1)  Keflex 500 Mg Caps (Cephalexin) .Marland Kitchen.. 1 by mouth four times a day 2)  Cortisporin 0.5-0.5-10000 Crea (Neomycin-polymyxin-hc) .... Apply to area two times a day for 7 days  Complete Medication List: 1)  Ecotrin Low Strength 81 Mg Tbec (Aspirin) .... Take 1 tab by mouth once daily.Marland KitchenMarland Kitchen 2)  Amlodipine Besy-benazepril Hcl 10-20 Mg Caps (Amlodipine besy-benazepril hcl) .... Take 1 tab by mouth once daily.Marland KitchenMarland Kitchen 3)  Simvastatin 40 Mg Tabs (Simvastatin) .... Take  1 tab  by mouth once daily 4)  Fish Oil 1000 Mg Caps (Omega-3 fatty acids) .... Take 1 cap by mouth once daily.Marland KitchenMarland Kitchen 5)  Prevacid 30 Mg Cpdr (Lansoprazole) .... Take 1 cap by mouth two times a day... 6)  Alprazolam 0.5 Mg Tabs (Alprazolam) .... Take 1/2 to 1 tab by mouth three times a day as needed for nerves.Marland KitchenMarland Kitchen 7)  Keflex 500 Mg Caps (Cephalexin) .Marland Kitchen.. 1 by mouth four times a day 8)  Cortisporin 0.5-0.5-10000 Crea (Neomycin-polymyxin-hc) .... Apply to area two times a day for 7 days  Patient Instructions: 1)  Keflex 500mg  four times a day for 7 days 2)  Wash navel gentle w/ soap/water and pat dry 3)  Apply cortisporin to area two times a day w/ qtip.  4)  Warm compresses  two times a day  5)  follow up 2 weeks  6)  Please contact office for sooner follow up if symptoms do not improve or worsen  Prescriptions: CORTISPORIN 0.5-0.5-10000 CREA (NEOMYCIN-POLYMYXIN-HC) apply to area two times a day for 7 days  #1 x 0   Entered and Authorized by:   Rubye Oaks NP   Signed by:   Rubye Oaks NP on  03/26/2009   Method used:   Electronically to        [facility] (retail)       [address]       [place], [place]  [address]       Ph: [phone]       Fax: [phone]   RxID:   [id number] KEFLEX 500 MG CAPS (CEPHALEXIN) 1 by mouth four times a day  #28 x 0   Entered and Authorized by:   Rubye Oaks NP   Signed by:   Rubye Oaks NP on 03/26/2009   Method used:   Electronically to        [facility] (retail)       [address]       [place], [place]  [id number]       Ph: [phone]       Fax: [phone]   RxID:   [id number]

## 2010-03-03 NOTE — Assessment & Plan Note (Signed)
Summary: 6 months/apc   CC:  6 month ROV & review of mult medical problems....  History of Present Illness: 70 y/o WM here for a follow up visit... he has mult med probs as noted below...    ~  Aug09: seen w/ left chest discomfort, mult somatic complaints, and anxiety... he thought his left breast was larger than the right & tender on palp- and he wanted eval... subseq CXR, Mammogram, & Sonar were all WNL.Marland Kitchen. he feels good about the thorough eval and all symptoms have resolved...  ~  Sep09: he has had a diverticular bleed and was adm to [facility] 5/14 - 06/17/07... this bleed was self-limited and he didn't require transfusion (Hg 11-12 range)... he was also found to have pos HPylori serology and treated w/ PrevPak x 14d... f/u w/ [name]- no change in GI treatment...   ~  Mar10:  he saw [facility] in Jan w/ dysphagia- repeat EGD showed HH, erosive esophagitis & he was dilated & placed on Prevacid Bid- improved... feels well overall but still somewhat fixated on his breast stating that his right breast itches some- no nodules, no mass, etc... repeat breast exam is totally neg... I suggested thaty he increase his Prn Alprazolam...    ~  October 01, 2008:  he's had a good 6 months- no new complaints or concerns... stable on current med Rx...   ~  March 31, 2009:  recent eval by [facility] for vague abd symptoms- prev Sonar neg & HIDA planned... had superfic infection around navel treated w/ Keflex/ cortisporin & improved... BP stable on Rx;  Chol has been well controlled on Simva40;  he tells me he will cont to f/u Dr[facility]Davis at [facility]...    Current Problem List:  ALLERGIC RHINITIS (ICD-477.9) - uses OTC meds Prn...  HYPERTENSION (ICD-401.9) - on LOTREL 10-20/d... BP=118/72 today and similar at home as well... denies HA, fatigue, visual changes, CP, palipit, dizziness, syncope, dyspnea, edema, etc...  CHEST PAIN, ATYPICAL (ICD-786.59) - hx CP and tachy palpit in 2003 w/ neg cardiac eval from  [name]... he is on ASA 81mg /d, and asymptomatic currently.  ~  NuclearStressTest 6/03 showed no infarct, ? mild ischemia inferiorly, EF= 57%...  ~  [name] was negative without stress induced wall motion abn...  HYPERCHOLESTEROLEMIA (ICD-272.0) - on SIMVASTATIN 40mg /d + FISH OIL 1000mg /d...  ~  FLP 3/09 showed TChol 157, TG 33, HDL 38, LDL 113  ~  FLP 3/10 showed TChol 145, TG 32, HDL 40, LDL 99  ~  FLP 2/11 showed TChol 199, TG 34, HDL 54, LDL 139... rec> same med, better diet, get wt down.  GERD (ICD-530.81) & PUD, HX OF (ICD-V12.71) - on PREVACID 30mg /d now ... followed by [facility] for GI- as above.  ~  EGD 2004 showed large HH & schatzki ring...  ~  AbdSonar 10/04 was WNL...  ~  EDG 1/10 repeated for dysphagia w/ HH, schatzki ring, erosive esophagitis...dilated & PPI incr to Bid...  DIVERTICULOSIS OF COLON (ICD-562.10) & COLONIC POLYPS (ICD-211.3) - GI = [name] and pt reports seen 3/09 w/ colonoscopy showing 4 polyps... f/u planned 5yrs.  ~  adm to [name] 5/09 w/ lower GI bleed likely from [employer]ics- self-limited, didn't req Tx etc...  Hx of CALCULUS, KIDNEY (ICD-592.0) - followed by [facility] & he  also does his PSA's.  ~  pt reports PSA 3/10 was <2  ~  labs here 2/11 showed PSA= 1.13  LOW BACK PAIN, CHRONIC (ICD-724.2) - he has a hx of  LBP and FM-like symptoms... states doing well w/ exercise program.  ANXIETY (ICD-300.00) - takes ALPRAZOLAM 0.5mg  Prn...  Health Maintenance - up to date on colonoscopy and prostate evals... he had TETANUS shot 2003, and PNEUMOVAX at age 72 in 2003- repeat PNEUMOVAX given 2/11 at age 1... he gets yearly Flu shots as well...   Allergies (verified): No Known Drug Allergies  Comments:  Nurse/Medical Assistant: The patient's medications and allergies were reviewed with the patient and were updated in the Medication and Allergy Lists.  Past History:  Past Medical History:  ALLERGIC RHINITIS (ICD-477.9) HYPERTENSION  (ICD-401.9) CHEST PAIN, ATYPICAL (ICD-786.59) HYPERCHOLESTEROLEMIA (ICD-272.0) GERD (ICD-530.81) PUD, HX OF (ICD-V12.71) DIVERTICULOSIS OF COLON (ICD-562.10) COLONIC POLYPS (ICD-211.3) Hx of CALCULUS, KIDNEY (ICD-592.0) LOW BACK PAIN, CHRONIC (ICD-724.2) ANXIETY (ICD-300.00)  Past Surgical History: S/P left inguinal hernia repair S/P left rotator cuff surgery - 5/08 by [name]  Family History: Reviewed history from 10/05/2007 and no changes required. father died at 6 from CHF mother died at 14 from pneumonia 4 siblings all in good health  Social History: Reviewed history from 10/05/2007 and no changes required. never smoked does not exercise 1 cup of coffee a day married 4 children no etoh  Review of Systems      See HPI  The patient denies anorexia, fever, weight loss, weight gain, vision loss, decreased hearing, hoarseness, chest pain, syncope, dyspnea on exertion, peripheral edema, prolonged cough, headaches, hemoptysis, abdominal pain, melena, hematochezia, severe indigestion/heartburn, hematuria, incontinence, muscle weakness, suspicious skin lesions, transient blindness, difficulty walking, depression, unusual weight change, abnormal bleeding, enlarged lymph nodes, and angioedema.    Vital Signs:  Patient profile:   70 year old male Height:      69 inches Weight:      169.13 pounds O2 Sat:      97 % on Room air Temp:     97.7 degrees F oral Pulse rate:   63 / minute BP sitting:   118 / 82  (left arm) Cuff size:   regular  Vitals Entered By: Randell Loop CMA (March 31, 2009 11:14 AM)  O2 Sat at Rest %:  97 O2 Flow:  Room air CC: 6 month ROV & review of mult medical problems... Is Patient Diabetic? No Pain Assessment Patient in pain? no      Comments no changes in meds today   Physical Exam  Additional Exam:  WD, WN, 70 y/o WM in NAD... GENERAL:  Alert & oriented; pleasant & cooperative... HEENT:  [place]/AT, EOM-full, PERRLA, EACs-clear, TMs-wnl,  NOSE-clear, THROAT-clear & wnl. NECK:  Supple w/ fairROM; no JVD; normal carotid impulses w/o bruits; no thyromegaly or nodules palpated; no lymphadenopathy. CHEST:  Clear to P & A; without wheezes/ rales/ or rhonchi heard...  HEART:  Regular Rhythm; without murmurs/ rubs/ or gallops detected... ABDOMEN:  Soft & nontender; normal bowel sounds; no organomegaly or masses palpated... EXT: without deformities, mild arthritic changes; no varicose veins/ venous insuffic/ or edema. NEURO:  CN's intact; motor testing normal; sensory testing normal; gait normal & balance OK. DERM:  No lesions noted; no rash etc...    MISC. Report  Procedure date:  03/31/2009  Findings:      Lipid Panel (LIPID)   Cholesterol               199 mg/dL                   1-610   Triglycerides  34.0 mg/dL                  1.6-109.6   HDL                       04.54 mg/dL                 >09.81   LDL Cholesterol      [H]  191 mg/dL                   4-78     BMP (METABOL)   Sodium                    143 mEq/L                   135-145   Potassium                 4.2 mEq/L                   3.5-5.1   Chloride                  107 mEq/L                   96-112   Carbon Dioxide            30 mEq/L                    19-32   Glucose              [H]  100 mg/dL                   29-56   BUN                  [H]  26 mg/dL                    2-13   Creatinine                0.6 mg/dL                   0.8-6.5   Calcium                   9.4 mg/dL                   7.8-46.9   GFR                       141.29 mL/min               >60  Hepatic/Liver Function Panel (HEPATIC)   Total Bilirubin           0.7 mg/dL                   6.2-9.5   Direct Bilirubin          0.1 mg/dL                   2.8-4.1   Alkaline Phosphatase      50 U/L                      39-117   AST  26 U/L                      0-37   ALT                       31 U/L                      0-53   Total Protein              7.5 g/dL                    1.6-1.0   Albumin                   4.3 g/dL                    9.6-0.4  Comments:      CBC Platelet w/Diff (CBCD)   White Cell Count          5.4 K/uL                    4.5-10.5   Red Cell Count            4.82 Mil/uL                 4.22-5.81   Hemoglobin                14.9 g/dL                   54.0-98.1   Hematocrit                45.2 %                      39.0-52.0   MCV                       93.6 fl                     78.0-100.0   Platelet Count            191.0 K/uL                  150.0-400.0   Neutrophil %              55.7 %                      43.0-77.0   Lymphocyte %              34.6 %                      12.0-46.0   Monocyte %                7.7 %                       3.0-12.0   Eosinophils%              1.2 %                       0.0-5.0   Basophils %               0.8 %  0.0-3.0   TSH (TSH)   FastTSH                   1.54 uIU/mL                 0.35-5.50  Prostate Specific Antigen (PSA)   PSA-Hyb                   1.13 ng/mL                  0.10-4.00   Impression & Recommendations:  Problem # 1:  HYPERTENSION (ICD-401.9) Controlled on this med-  continue same. His updated medication list for this problem includes:    Amlodipine Besy-benazepril Hcl 10-20 Mg Caps (Amlodipine besy-benazepril hcl) .Marland Kitchen... Take 1 tab by mouth once daily...  Orders: TLB-Lipid Panel (80061-LIPID) TLB-BMP (Basic Metabolic Panel-BMET) (80048-METABOL) TLB-Hepatic/Liver Function Pnl (80076-HEPATIC) TLB-CBC Platelet - w/Differential (85025-CBCD) TLB-TSH (Thyroid Stimulating Hormone) (84443-TSH) TLB-PSA (Prostate Specific Antigen) (84153-PSA)  Problem # 2:  HYPERCHOLESTEROLEMIA (ICD-272.0) FLP not as good-  needs better diet, get wt down, take med regularly... His updated medication list for this problem includes:    Simvastatin 40 Mg Tabs (Simvastatin) .Marland Kitchen... Take 1 tab  by mouth once daily  Problem # 3:  GERD  (ICD-530.81) Stable-  continue meds... getting HIDA scan per [facility]. His updated medication list for this problem includes:    Prevacid 30 Mg Cpdr (Lansoprazole) .Marland Kitchen... Take 1 cap by mouth two times a day...  Problem # 4:  COLONIC POLYPS (ICD-211.3) He is stable, and up to date w/ his colons etc... he can try Bean-O, Gas-X, Mylanta Gas, etc...  Problem # 5:  Hx of CALCULUS, KIDNEY (ICD-592.0) He states he will continue w/ his [place] f/u w/ DrRDavis [place]...  Problem # 6:  HX MULT MEDICAL PROBLEMS AS NOTED>>>  Complete Medication List: 1)  Ecotrin Low Strength 81 Mg Tbec (Aspirin) .... Take 1 tab by mouth once daily.Marland KitchenMarland Kitchen 2)  Amlodipine Besy-benazepril Hcl 10-20 Mg Caps (Amlodipine besy-benazepril hcl) .... Take 1 tab by mouth once daily.Marland KitchenMarland Kitchen 3)  Simvastatin 40 Mg Tabs (Simvastatin) .... Take 1 tab  by mouth once daily 4)  Fish Oil 1000 Mg Caps (Omega-3 fatty acids) .... Take 1 cap by mouth once daily.Marland KitchenMarland Kitchen 5)  Prevacid 30 Mg Cpdr (Lansoprazole) .... Take 1 cap by mouth two times a day... 6)  Alprazolam 0.5 Mg Tabs (Alprazolam) .... Take 1/2 to 1 tab by mouth three times a day as needed for nerves.Marland KitchenMarland Kitchen 7)  Keflex 500 Mg Caps (Cephalexin) .Marland Kitchen.. 1 by mouth four times a day 8)  Cortisporin 0.5-0.5-10000 Crea (Neomycin-polymyxin-hc) .... Apply to area two times a day for 7 days  Other Orders: Pneumococcal Vaccine (16109) Admin 1st Vaccine (60454)  Patient Instructions: 1)  Today we updated your med list- see below.... 2)  Let us know if you need any refills- we can do this electronically for your convenience... 3)  Today we did your follow up FASTING blood work... please call the "phone tree" in a few days for your lab results.Marland KitchenMarland Kitchen 4)  For your GAS: try BEAN-O, MYLICON, MYLANTA GAS - up to 4 times daily to prevent gas build up... 5)  Today we gave you a follow up PNEUMOVAX- according to the current recommendations- this will be the last one that you will need. 6)  Call for any problems.Marland KitchenMarland Kitchen 7)   Please schedule a follow-up appointment in 6 months.   Immunizations Administered:  Pneumonia Vaccine:  Vaccine Type: Pneumovax    Site: right deltoid    Mfr: Merck    Dose: 0.5 ml    Route: IM    Given by: Randell Loop CMA    Exp. Date: 09/15/2010    Lot #: [id number]    VIS given: 08/30/95 version given March 31, 2009.

## 2010-03-03 NOTE — Consult Note (Signed)
Summary: [facility]   Imported By: Sherian Rein 09/15/2009 13:35:07  _____________________________________________________________________  External Attachment:    Type:   Image     Comment:   External Document

## 2010-03-05 NOTE — Assessment & Plan Note (Signed)
Summary: ROV//SH   CC:  2 month ROV & add-on post ER....  History of Present Illness: 70 y/o WM here for a follow up visit... he has mult med probs as noted below...    ~  Aug09: seen w/ left chest discomfort, mult somatic complaints, and anxiety... he thought his left breast was larger than the right & tender on palp- and he wanted eval... subseq CXR, Mammogram, & Sonar were all WNL.Marland Kitchen. he feels good about the thorough eval and all symptoms have resolved...  ~  Sep09: he has had a diverticular bleed and was adm to [facility] 5/14 - 06/17/07... this bleed was self-limited and he didn't require transfusion (Hg 11-12 range)... he was also found to have pos HPylori serology and treated w/ PrevPak x 14d... f/u w/ [name]- no change in GI treatment...   ~  Mar10:  he saw [facility] in Jan w/ dysphagia- repeat EGD showed HH, erosive esophagitis & he was dilated & placed on Prevacid Bid- improved... feels well overall but still somewhat fixated on his breast stating that his right breast itches some- no nodules, no mass, etc... repeat breast exam is totally neg... I suggested that he increase his Alprazolam...    ~  ZOX09:  recent eval by [facility] for vague abd symptoms- prev Sonar neg & HIDA planned... had superfic infection around navel treated w/ Keflex/ cortisporin & improved... BP stable on Rx;  Chol has been well controlled on Simva40;  he tells me he will cont to f/u DrRDavis at [facility]...  ~  UEA54:  he fell while standing in the bed of his truck 8/11 w/ left humerus fx & surg [name]- lots of rehab & still w/ decr ROM (he's left handed)... BP controlled;  remains on diet & Simva40 for Chol;  GI has been stable; and Alpraz helps nerves...  OK Flu vaccine today & check fasting labs.   ~  February 05, 2010:  he was eval in ER 01/23/10 for CP> atyp sharp left CP (s/p trauma w/ left humerus surg 9/11 by [name]) w/ neg EKG, neg ENZ, & neg StressEcho per ER protocol (he is reassured since a friend dropped  dead last yr while mowing)... he notes intermittent dizziness, some drainage & congestion> we discussed benefits to Rx w/ Meclizine & Mucinex...  BP remains norm on Lotrel;  Chol remains reasonable on Simva40 + diet;  he noted some insomnia- improved on Restoril30 per [name] but he is advised to try the Alpra0.5 at bedtime...    Current Problem List:  ALLERGIC RHINITIS (ICD-477.9) - uses OTC meds Prn...  HYPERTENSION (ICD-401.9) - on LOTREL 10-20/d... BP=118/70 today and similar at home as well... denies HA, fatigue, visual changes, CP, palipit, dizziness, syncope, dyspnea, edema, etc...  CHEST PAIN, ATYPICAL (ICD-786.59) - hx CP and tachy palpit in 2003 w/ neg cardiac eval from [name]... he is on ASA 81mg /d, and asymptomatic currently.  ~  NuclearStressTest 6/03 showed no infarct, ? mild ischemia inferiorly, EF= 57%...  ~  [name] 6/03 was negative without stress induced wall motion abn...  ~  12/11:  CP eval via ER w/ neg EKG, neg ENZ, & norm StressEcho...  HYPERCHOLESTEROLEMIA (ICD-272.0) - on SIMVASTATIN 40mg /d + FISH OIL 1000mg /d...  ~  FLP 3/09 showed TChol 157, TG 33, HDL 38, LDL 113  ~  FLP 3/10 showed TChol 145, TG 32, HDL 40, LDL 99  ~  FLP 2/11 showed TChol 199, TG 34, HDL 54, LDL 139... rec> same med, better  diet, get wt down.  ~  FLP 11/11 showed TChol 168, TG 50, HDL 41, LDL 117  GERD (ICD-530.81) & PUD, HX OF (ICD-V12.71) - on PREVACID 30mg Bid now ... followed by [facility] for GI- as above.  ~  EGD 2004 showed large HH & schatzki ring...  ~  AbdSonar 10/04 was WNL...  ~  EDG 1/10 repeated for dysphagia w/ HH, schatzki ring, erosive esophagitis...dilated & PPI incr to Bid...  DIVERTICULOSIS OF COLON (ICD-562.10) & COLONIC POLYPS (ICD-211.3) - GI = [name] and pt reports seen 3/09 w/ colonoscopy showing 4 polyps... f/u planned 22yrs.  ~  adm to [name] 5/09 w/ lower GI bleed likely from [employer]- self-limited, didn't req Tx etc...  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  ~  pt reports PSA 3/10 was <2  ~  labs here 2/11 showed PSA= 1.13  OSTEOARTHRITIS & ORTHOPEDIC ISSUES: LOW BACK PAIN, CHRONIC (ICD-724.2) - he has a hx of LBP and FM-like symptoms... states doing well w/ exercise program.  ~  8/11:  fall from bed of truck w/ left prox humerus fx & surg [name]- lots of rehab but still has decr ROM shoulder.  ANXIETY (ICD-300.00) - takes ALPRAZOLAM 0.5mg  Prn...  Health Maintenance - up to date on colonoscopy and prostate evals... he had TETANUS shot 2003, and PNEUMOVAX at age 47 in 2003- repeat PNEUMOVAX given 2/11 at age 11... he gets yearly Flu shots as well...   Allergies (verified): No Known Drug Allergies  Comments:  Nurse/Medical Assistant: The patient's medications and allergies were reviewed with the patient and were updated in the Medication and Allergy Lists.  Past History:  Past Medical History: ALLERGIC RHINITIS (ICD-477.9) HYPERTENSION (ICD-401.9) CHEST PAIN, ATYPICAL (ICD-786.59) HYPERCHOLESTEROLEMIA (ICD-272.0) GERD (ICD-530.81) PUD, HX OF (ICD-V12.71) DIVERTICULOSIS OF COLON (ICD-562.10) COLONIC POLYPS (ICD-211.3) Hx of CALCULUS, KIDNEY (ICD-592.0) LOW BACK PAIN, CHRONIC (ICD-724.2) ANXIETY (ICD-300.00)  Past Surgical History: S/P left inguinal hernia repair S/P left rotator cuff surgery - 5/08 by [name] S/P left shoulder replacement 8/11 after fall w/ prox humerus fx  Family History: Reviewed history from 12/05/2009 and no changes required. father died at 60 from CHF mother died at 76 from pneumonia 4 siblings all in good health  Social History: Reviewed history from 10/05/2007 and no changes required. never smoked does not exercise 1 cup of coffee a day married 4 children no etoh  Review of Systems      See HPI       The patient complains of dyspnea on exertion.  The patient denies anorexia, fever, weight loss, weight gain, vision loss,  decreased hearing, hoarseness, chest pain, syncope, peripheral edema, prolonged cough, headaches, hemoptysis, abdominal pain, melena, hematochezia, severe indigestion/heartburn, hematuria, incontinence, muscle weakness, suspicious skin lesions, transient blindness, difficulty walking, depression, unusual weight change, abnormal bleeding, enlarged lymph nodes, and angioedema.    Vital Signs:  Patient profile:   70 year old male Weight:      165.13 pounds O2 Sat:      96 % on Room air Temp:     97.7 degrees F oral Pulse rate:   70 / minute BP sitting:   118 / 70  (left arm) Cuff size:   regular  Vitals Entered By: Abigail Miyamoto RN (February 05, 2010 11:11 AM)  O2 Flow:  Room air  Physical Exam  Additional Exam:  WD, WN, 71 y/o WM in NAD... GENERAL:  Alert & oriented; pleasant & cooperative... HEENT:  [place]/AT, EOM-full, PERRLA, EACs-clear, TMs-wnl, NOSE-clear, THROAT-clear & wnl. NECK:  Supple w/ fairROM; no JVD; normal carotid impulses w/o bruits; no thyromegaly or nodules palpated; no lymphadenopathy. CHEST:  Clear to P & A; without wheezes/ rales/ or rhonchi heard...  HEART:  Regular Rhythm; without murmurs/ rubs/ or gallops detected... ABDOMEN:  Soft & nontender; normal bowel sounds; no organomegaly or masses palpated... EXT:  s/p left humerus surg, mild arthritic changes; no varicose veins/ venous insuffic/ or edema. NEURO:  CN's intact; motor testing normal; sensory testing normal; gait normal & balance OK. DERM:  No lesions noted; periumbil rash improved...    MISC. Report  Procedure date:  02/05/2010  Findings:      Data Reviewed:  ~  ER records fro 01/23/10 including ER notes, XRay, EKG, Labs, & StressEcho report...   SN   Impression & Recommendations:  Problem # 1:  CHEST PAIN, ATYPICAL (ICD-786.59) Cardiac w/u was neg & he is reassured...  Problem # 2:  HYPERTENSION (ICD-401.9) Controlled on med>  continue same... His updated medication list for this problem  includes:    Amlodipine Besy-benazepril Hcl 10-20 Mg Caps (Amlodipine besy-benazepril hcl) .Marland Kitchen... Take 1 tab by mouth once daily...  Problem # 3:  HYPERCHOLESTEROLEMIA (ICD-272.0) FLP 11/11 was improved>  continue diet/ exercise/ Simva40... His updated medication list for this problem includes:    Simvastatin 40 Mg Tabs (Simvastatin) .Marland Kitchen... Take 1 tab  by mouth once daily  Problem # 4:  GERD (ICD-530.81) GI is stable per [facility]... His updated medication list for this problem includes:    Prevacid 30 Mg Cpdr (Lansoprazole) .Marland Kitchen... Take 1 cap by mouth two times a day...  Problem # 5:  Hx of CALCULUS, KIDNEY (ICD-592.0) GU is stable per DrRDavis...  Problem # 6:  DIZZINESS (ICD-780.4) We will refill Rx for Meclizine... His updated medication list for this problem includes:    Meclizine Hcl 25 Mg Tabs (Meclizine hcl) .Marland Kitchen... Take 1/2 to 1 tab by mouth up to 3 times daily as needed for dizziness...  Problem # 7:  OTHER MEDICAL PROBLEMS AS NOTED>>>  Complete Medication List: 1)  Ecotrin Low Strength 81 Mg Tbec (Aspirin) .... Take 1 tab by mouth once daily.Marland KitchenMarland Kitchen 2)  Amlodipine Besy-benazepril Hcl 10-20 Mg Caps (Amlodipine besy-benazepril hcl) .... Take 1 tab by mouth once daily.Marland KitchenMarland Kitchen 3)  Simvastatin 40 Mg Tabs (Simvastatin) .... Take 1 tab  by mouth once daily 4)  Fish Oil 1000 Mg Caps (Omega-3 fatty acids) .... Take 1 cap by mouth once daily.Marland KitchenMarland Kitchen 5)  Prevacid 30 Mg Cpdr (Lansoprazole) .... Take 1 cap by mouth two times a day... 6)  Alprazolam 0.5 Mg Tabs (Alprazolam) .... Take 1/2 to 1 tab by mouth three times a day as needed for nerves.Marland KitchenMarland Kitchen 7)  Temazepam 30 Mg Caps (Temazepam) .... Take one by mouth at bedtime as needed per drsupple 8)  Meclizine Hcl 25 Mg Tabs (Meclizine hcl) .... Take 1/2 to 1 tab by mouth up to 3 times daily as needed for dizziness...  Patient Instructions: 1)  Today we updated your med list- see below.... 2)  We wrote a new perscription for MECLIZINE to take up to 3 times daily  as needed for dizziness... 3)  You may also take MUCINEX OTC> 1-2 tabs twice daily for drainage & congestion.Marland KitchenMarland Kitchen 4)  You may also try the ALPRAZOLAM at bedtime for sleep.Marland KitchenMarland Kitchen 5)  Call for any questions.Marland KitchenMarland Kitchen 6)  Keep your sched f/u appt in May & we will plan FASTING blood work  at that time. Prescriptions: MECLIZINE HCL 25 MG TABS (MECLIZINE HCL) take 1/2 to 1 tab by mouth up to 3 times daily as needed for dizziness...  #100 x 6   Entered and Authorized by:   Michele Mcalpine MD   Signed by:   Michele Mcalpine MD on 02/05/2010   Method used:   Print then Give to Patient   RxID:   [id number]

## 2010-03-12 ENCOUNTER — Telehealth: Payer: Self-pay | Admitting: Pulmonary Disease

## 2010-03-19 NOTE — Progress Notes (Signed)
Summary: Alprazolam refill request  Phone Note Refill Request Message from:  Fax from Pharmacy on March 12, 2010 11:45 AM  Refills Requested: Medication #1:  ALPRAZOLAM 0.5 MG  TABS take 1/2 to 1 tab by mouth three times a day as needed for nerves...   Dosage confirmed as above?Dosage Confirmed   Brand Name Necessary? No   Supply Requested: 1 month   Last Refilled: 09/01/2009   Notes: Dr. Kriste Basque patient [facility] (p) [phone]   Method Requested: Telephone to Pharmacy Next Appointment Scheduled: 06/04/2010 w/ SN Initial call taken by: Michel Bickers CMA,  March 12, 2010 11:46 AM  Follow-up for Phone Call        Pls advise if okay to refill.  Michel Bickers [facility]  March 12, 2010 11:47 AM    Prescriptions: ALPRAZOLAM 0.5 MG  TABS (ALPRAZOLAM) take 1/2 to 1 tab by mouth three times a day as needed for nerves...  #100 x 5   Entered by:   Randell Loop CMA   Authorized by:   Michele Mcalpine MD   Signed by:   Randell Loop CMA on 03/12/2010   Method used:   Telephoned to ...       [facility] (retail)       [address]       [place], [place]  [id number]       Ph: [phone]       Fax: [phone]   RxID:   [id number]

## 2010-03-20 ENCOUNTER — Telehealth (INDEPENDENT_AMBULATORY_CARE_PROVIDER_SITE_OTHER): Payer: Self-pay | Admitting: *Deleted

## 2010-03-25 NOTE — Progress Notes (Signed)
Summary: n/v/d > clear liquids, phenergan, immodium  Phone Note Call from Patient Call back at Home Phone [phone]   Caller: Spouse//brenda Call For: nadel Summary of Call: Pt c/o stomach virus, throwing up, diarrhea since last night wants something called in pls advise.//[facility] Initial call taken by: Darletta Moll,  March 20, 2010 8:19 AM  Follow-up for Phone Call        Pt c/o diarrhea starting last night less this AM, throwing up (was doing same while I was on the phone with spouse) yellow in color, stomach pain, increased belching, bloating, abdominal pain, bodyaches. Denies fever. Ate at [facility] last PM. Unable to eat today has been drinking ginger ale, coke, and some popcicles. Please advise. Thanks. Zackery Barefoot CMA  March 20, 2010 11:18 AM   Additional Follow-up for Phone Call Additional follow up Details #1::        per SN---clear liquids, avoid [place] BBQ---use immodium otc   1 by mouth every 4 hours as needed for watery diarrhea, phenergan tabs @12    1 by mouth every 6 horus as needed for nausea or he can use the supp # 6  1 inserted every 6 hours as needed ---which ever the pt requests.  thanks Randell Loop CMA  March 20, 2010 4:35 PM     Additional Follow-up for Phone Call Additional follow up Details #2::    called spoke with patient, advised of SN's recs as stated above.  pt verbalized his understanding and stated that he has some promethazine 25mg  tabs at home that he has been able to keep down and has helped with his nausea.  i offered pt a rx for promethazine supp, but pt declined this stating that he is okay with the tabs.  asked pt if he has enough of his previous rx or if he needs a new one.  pt stated that he has enough.  encouraged pt call if he does not improve over the weekend. Boone Master CNA/MA  March 20, 2010 4:51 PM

## 2010-04-13 LAB — POCT CARDIAC MARKERS
CKMB, poc: <1 ng/mL — ABNORMAL LOW (ref 1.0–8.0)
CKMB, poc: <1 ng/mL — ABNORMAL LOW (ref 1.0–8.0)
CKMB, poc: <1 ng/mL — ABNORMAL LOW (ref 1.0–8.0)
CKMB, poc: <1 ng/mL — ABNORMAL LOW (ref 1.0–8.0)
Myoglobin, poc: 48.1 ng/mL (ref 12–200)
Myoglobin, poc: 61.8 ng/mL (ref 12–200)
Myoglobin, poc: 66.6 ng/mL (ref 12–200)
Troponin i, poc: <0.05 ng/mL (ref 0.00–0.09)

## 2010-04-13 LAB — CBC
HCT: 45 % (ref 39.0–52.0)
Hemoglobin: 14.7 g/dL (ref 13.0–17.0)
MCH: 29 pg (ref 26.0–34.0)
MCV: 88.8 fL (ref 78.0–100.0)
RBC: 5.07 MIL/uL (ref 4.22–5.81)
WBC: 5.1 10*3/uL (ref 4.0–10.5)

## 2010-04-13 LAB — DIFFERENTIAL
Basophils Absolute: 0 10*3/uL (ref 0.0–0.1)
Basophils Relative: 1 % (ref 0–1)
Lymphocytes Relative: 36 % (ref 12–46)
Monocytes Absolute: 0.4 10*3/uL (ref 0.1–1.0)
Monocytes Relative: 9 % (ref 3–12)
Neutro Abs: 2.7 10*3/uL (ref 1.7–7.7)
Neutrophils Relative %: 53 % (ref 43–77)

## 2010-04-13 LAB — POCT I-STAT, CHEM 8
BUN: 21 mg/dL (ref 6–23)
Chloride: 107 mEq/L (ref 96–112)
Creatinine, Ser: 0.7 mg/dL (ref 0.4–1.5)
Sodium: 142 mEq/L (ref 135–145)
TCO2: 27 mmol/L (ref 0–100)

## 2010-04-17 LAB — URINALYSIS, ROUTINE W REFLEX MICROSCOPIC
Bilirubin Urine: NEGATIVE
Glucose, UA: NEGATIVE mg/dL
Hgb urine dipstick: NEGATIVE
Ketones, ur: NEGATIVE mg/dL
Nitrite: NEGATIVE
Protein, ur: NEGATIVE mg/dL
Specific Gravity, Urine: 1.017 (ref 1.005–1.030)
Urobilinogen, UA: 1 mg/dL (ref 0.0–1.0)
pH: 7.5 (ref 5.0–8.0)

## 2010-04-17 LAB — CBC
MCH: 30.9 pg (ref 26.0–34.0)
MCHC: 33.3 g/dL (ref 30.0–36.0)
MCV: 92.6 fL (ref 78.0–100.0)
Platelets: 261 10*3/uL (ref 150–400)
RDW: 12.9 % (ref 11.5–15.5)

## 2010-04-17 LAB — COMPREHENSIVE METABOLIC PANEL
ALT: 35 U/L (ref 0–53)
AST: 25 U/L (ref 0–37)
Albumin: 4.2 g/dL (ref 3.5–5.2)
Alkaline Phosphatase: 64 U/L (ref 39–117)
BUN: 19 mg/dL (ref 6–23)
CO2: 25 mEq/L (ref 19–32)
Calcium: 9.1 mg/dL (ref 8.4–10.5)
Chloride: 105 mEq/L (ref 96–112)
Creatinine, Ser: 0.68 mg/dL (ref 0.4–1.5)
GFR calc Af Amer: >60 mL/min (ref >60)
GFR calc non Af Amer: >60 mL/min (ref >60)
Glucose, Bld: 99 mg/dL (ref 70–99)
Potassium: 4.3 mEq/L (ref 3.5–5.1)
Sodium: 139 mEq/L (ref 135–145)
Total Bilirubin: 0.8 mg/dL (ref 0.3–1.2)
Total Protein: 7.1 g/dL (ref 6.0–8.3)

## 2010-04-17 LAB — APTT: aPTT: 25 seconds (ref 24–37)

## 2010-04-17 LAB — PROTIME-INR
INR: 0.97 (ref 0.00–1.49)
Prothrombin Time: 13.1 seconds (ref 11.6–15.2)

## 2010-04-17 LAB — SURGICAL PCR SCREEN: MRSA, PCR: NEGATIVE

## 2010-06-02 ENCOUNTER — Encounter: Payer: Self-pay | Admitting: Pulmonary Disease

## 2010-06-04 ENCOUNTER — Other Ambulatory Visit (INDEPENDENT_AMBULATORY_CARE_PROVIDER_SITE_OTHER): Payer: Medicare Other

## 2010-06-04 ENCOUNTER — Encounter: Payer: Self-pay | Admitting: Pulmonary Disease

## 2010-06-04 ENCOUNTER — Ambulatory Visit (INDEPENDENT_AMBULATORY_CARE_PROVIDER_SITE_OTHER): Payer: Medicare Other | Admitting: Pulmonary Disease

## 2010-06-04 DIAGNOSIS — E7800 Pure hypercholesterolemia, unspecified: Secondary | ICD-10-CM

## 2010-06-04 DIAGNOSIS — F411 Generalized anxiety disorder: Secondary | ICD-10-CM

## 2010-06-04 DIAGNOSIS — I10 Essential (primary) hypertension: Secondary | ICD-10-CM

## 2010-06-04 DIAGNOSIS — N139 Obstructive and reflux uropathy, unspecified: Secondary | ICD-10-CM

## 2010-06-04 DIAGNOSIS — R0789 Other chest pain: Secondary | ICD-10-CM

## 2010-06-04 DIAGNOSIS — K219 Gastro-esophageal reflux disease without esophagitis: Secondary | ICD-10-CM

## 2010-06-04 DIAGNOSIS — M1990 Unspecified osteoarthritis, unspecified site: Secondary | ICD-10-CM

## 2010-06-04 LAB — BASIC METABOLIC PANEL
Chloride: 104 mEq/L (ref 96–112)
GFR: 135.59 mL/min (ref >60.00)
Glucose, Bld: 97 mg/dL (ref 70–99)
Potassium: 4.3 mEq/L (ref 3.5–5.1)
Sodium: 140 mEq/L (ref 135–145)

## 2010-06-04 LAB — CBC WITH DIFFERENTIAL/PLATELET
Basophils Absolute: 0 10*3/uL (ref 0.0–0.1)
Eosinophils Absolute: 0.1 10*3/uL (ref 0.0–0.7)
HCT: 43.6 % (ref 39.0–52.0)
Lymphs Abs: 1.8 10*3/uL (ref 0.7–4.0)
MCV: 91.5 fl (ref 78.0–100.0)
Monocytes Absolute: 0.4 10*3/uL (ref 0.1–1.0)
Platelets: 194 10*3/uL (ref 150.0–400.0)
RDW: 13.9 % (ref 11.5–14.6)

## 2010-06-04 LAB — HEPATIC FUNCTION PANEL
ALT: 22 U/L (ref 0–53)
AST: 19 U/L (ref 0–37)
Alkaline Phosphatase: 59 U/L (ref 39–117)
Total Bilirubin: 0.5 mg/dL (ref 0.3–1.2)

## 2010-06-04 LAB — LIPID PANEL
LDL Cholesterol: 134 mg/dL — ABNORMAL HIGH (ref 0–99)
VLDL: 6 mg/dL (ref 0.0–40.0)

## 2010-06-04 LAB — TSH: TSH: 2.2 u[IU]/mL (ref 0.35–5.50)

## 2010-06-04 MED ORDER — MECLIZINE HCL 25 MG PO TABS: ORAL_TABLET | ORAL | Status: DC

## 2010-06-04 NOTE — Progress Notes (Signed)
Subjective:    Patient ID: Mark Jordan, male    DOB: [birth year], 70 y.o.   MRN: [id number]  HPI 70 y/o WM here for a follow up visit... he has mult med probs as noted below...   ~  February 05, 2010:  he was eval in ER 01/23/10 for CP> atyp sharp left CP (s/p trauma w/ left humerus surg 9/11 by [name]) w/ neg EKG, neg ENZ, & neg StressEcho per ER protocol (he is reassured since a friend dropped dead last yr while mowing)... he notes intermittent dizziness, some drainage & congestion> we discussed benefits to Rx w/ Meclizine & Mucinex...  BP remains norm on Lotrel;  Chol remains reasonable on Simva40 + diet;  he noted some insomnia- improved on Restoril30 per [name] but he is advised to try the Alpra0.5 at bedtime...  ~  Jun 04, 2010:  4 mo ROV & he notes some weakness, low energy, etc;  He's finished his rehab for his left shoulder & now doing exercises at home, walking, some yard work, denies CP etc;  He tells me that [facility] did some additional GI tests- ?Sonar, Hepatobiliary scan (normal x some symptoms w/ [facility] infusion) & sent him to the surgeons but they didn't think his GB needed to be removed... Due for fasting labs today> see prob list below:        Problem List:  ALLERGIC RHINITIS (ICD-477.9) - uses OTC meds Prn...  HYPERTENSION (ICD-401.9) - on LOTREL 10-20/d...  ~  1/12:  BP=118/70 and similar at home... denies HA, fatigue, visual changes, CP, palipit, dizziness, syncope, dyspnea, edema, etc.. ~  5/12:  BP= 142/76 and he is doing reasonably well, tolerating med, & essentially asymptomatic...  CHEST PAIN, ATYPICAL (ICD-786.59) - hx CP and tachy palpit in 2003 w/ neg cardiac eval from [name]... on ASA 81mg /d, and asymptomatic currently. ~  NuclearStressTest 6/03 showed no infarct, ? mild ischemia inferiorly, EF= 57%... ~  [name] 6/03 was negative without stress induced wall motion abn... ~  12/11:  CP eval via ER w/ neg EKG, neg ENZ, & norm  StressEcho...  HYPERCHOLESTEROLEMIA (ICD-272.0) - on SIMVASTATIN 40mg /d + FISH OIL 1000mg /d... ~  FLP 3/09 showed TChol 157, TG 33, HDL 38, LDL 113... Imroved, continue same med + diet. ~  FLP 3/10 showed TChol 145, TG 32, HDL 40, LDL 99 ~  FLP 2/11 showed TChol 199, TG 34, HDL 54, LDL 139... rec> same med, better diet, get wt down. ~  FLP 11/11 showed TChol 168, TG 50, HDL 41, LDL 117 ~  FLP 5/12 on Simva40+FishOil showed TChol 181, TG 30, HDL 41, LDL 134... May need stronger statin med.  GERD (ICD-530.81)  PUD, HX OF (ICD-V12.71) - on PREVACID 30mg Bid now ... followed by [facility] for GI. ~  EGD 2004 showed large HH & schatzki ring... ~  AbdSonar 10/04 was WNL... ~  EDG 1/10 repeated for dysphagia w/ HH, schatzki ring, erosive esophagitis...dilated & PPI incr to Bid...  DIVERTICULOSIS OF COLON (ICD-562.10)  COLONIC POLYPS (ICD-211.3) - GI = [name] and pt reports seen 3/09 w/ colonoscopy showing 4 polyps... f/u planned 50yrs. ~  adm to [name] 5/09 w/ lower GI bleed likely from [employer]- self-limited, didn't req Tx etc...  Hx of CALCULUS, KIDNEY (ICD-592.0) - followed by [id number] & he  also does his PSA's. ~  pt reports PSA 3/10 was <2 ~  labs here 2/11 showed PSA= 1.13 ~  Labs 5/12 showed PSA= 1.67  OSTEOARTHRITIS & ORTHOPEDIC ISSUES:  LOW BACK PAIN, CHRONIC (ICD-724.2) - he has a hx of LBP and FM-like symptoms... states doing well w/ exercise program. ~  8/11:  fall from bed of truck w/ left prox humerus fx & surg [name]- lots of rehab but still has decr ROM shoulder.  ANXIETY (ICD-300.00) - takes ALPRAZOLAM 0.5mg  Prn...  Health Maintenance - up to date on colonoscopy and prostate evals... he had TETANUS shot 2003, and PNEUMOVAX at age 47 in 2003- repeat PNEUMOVAX given 2/11 at age 71... he gets yearly Flu shots as well...   Past Surgical History  Procedure Date  . Inguinal hernia repair     left  . Rotator cuff repair 5/08    left by Dr Rennis Chris  . Total shoulder  arthroplasty 8/11    after fall w/ prox humerus fx    Outpatient Encounter Prescriptions as of 06/04/2010  Medication Sig Dispense Refill  . ALPRAZolam (XANAX) 0.5 MG tablet 1/2 - 1 tab by mouth three times daily as needed for nerves       . amLODipine-benazepril (LOTREL) 10-20 MG per capsule Take 1 capsule by mouth daily.        Marland Kitchen aspirin 81 MG tablet Take 81 mg by mouth daily.        . fish oil-omega-3 fatty acids 1000 MG capsule Take 1 g by mouth daily.        . lansoprazole (PREVACID) 30 MG capsule Take 30 mg by mouth 2 (two) times daily before a meal.        . meclizine (ANTIVERT) 25 MG tablet 1/2 - 1 tab by mouth up to three times daily as needed for dizziness       . simvastatin (ZOCOR) 40 MG tablet Take 40 mg by mouth at bedtime.        . temazepam (RESTORIL) 30 MG capsule Take 30 mg by mouth at bedtime as needed.          No Known Allergies   Current Medications, Allergies, Past Medical History, Past Surgical History, Family History, and Social History were reviewed in [employer] record.   Review of Systems         See HPI - all other systems neg except as noted... The patient complains of dyspnea on exertion.  The patient denies anorexia, fever, weight loss, weight gain, vision loss, decreased hearing, hoarseness, chest pain, syncope, peripheral edema, prolonged cough, headaches, hemoptysis, abdominal pain, melena, hematochezia, severe indigestion/heartburn, hematuria, incontinence, muscle weakness, suspicious skin lesions, transient blindness, difficulty walking, depression, unusual weight change, abnormal bleeding, enlarged lymph nodes, and angioedema.   Objective:   Physical Exam      WD, WN, 70 y/o WM in NAD... GENERAL:  Alert & oriented; pleasant & cooperative... HEENT:  [place]/AT, EOM-full, PERRLA, EACs-clear, TMs-wnl, NOSE-clear, THROAT-clear & wnl. NECK:  Supple w/ fairROM; no JVD; normal carotid impulses w/o bruits; no thyromegaly or nodules  palpated; no lymphadenopathy. CHEST:  Clear to P & A; without wheezes/ rales/ or rhonchi heard...  HEART:  Regular Rhythm; without murmurs/ rubs/ or gallops detected... ABDOMEN:  Soft & nontender; normal bowel sounds; no organomegaly or masses palpated... EXT:  s/p left humerus surg, mild arthritic changes; no varicose veins/ venous insuffic/ or edema. NEURO:  CN's intact; motor testing normal; sensory testing normal; gait normal & balance OK. DERM:  No lesions noted; periumbil rash improved...   Assessment & Plan:   HBP>  Controlled on Lotrel + diet/ exercise, reminded to elim salt etc...  Hx  of CP>  On ASA, no further prob since 12/11 trip to ER w/ neg eval at that time...  CHOL>  He is not at goal w/ fluctuating LDL over several yrs, offered stronger statin but he declines, wants to try the Simva40 + better diet etc first...  GI>  Followed by [facility], we don't have recent note but pt reports ?Sonar, hepatobil scan, trip to [facility] but they didn't feel that his GB needed removal...  ORTHO>  DJD, LBP, etc>  He is still doing his home therapy for the left shoulder injury but has permanent deficit in shoulder motion...  ANXIETY>  Continue Alpraz prn use.Marland KitchenMarland Kitchen

## 2010-06-04 NOTE — Patient Instructions (Signed)
Today we updated your med list in our EPIC system...    Continue your currents medications the same...  Today we did your follow up fasting blood work...    Please call the PHONE TREE in a few days for your results...    Dial [id number] & when prompted enter your patient number followed by the # symbol...    Your patient number is:  [id number]#  Call for any problems... Let's plan another follow up visit in 6 months.Marland KitchenMarland Kitchen

## 2010-06-13 ENCOUNTER — Encounter: Payer: Self-pay | Admitting: Pulmonary Disease

## 2010-07-22 ENCOUNTER — Other Ambulatory Visit: Payer: Self-pay | Admitting: Pulmonary Disease

## 2010-07-24 ENCOUNTER — Encounter: Payer: Self-pay | Admitting: Adult Health

## 2010-07-24 ENCOUNTER — Ambulatory Visit (INDEPENDENT_AMBULATORY_CARE_PROVIDER_SITE_OTHER): Payer: Medicare Other | Admitting: Adult Health

## 2010-07-24 VITALS — BP 144/90 | HR 58 | Temp 97.9°F | Ht 69.0 in | Wt 167.4 lb

## 2010-07-24 DIAGNOSIS — H9193 Unspecified hearing loss, bilateral: Secondary | ICD-10-CM | POA: Insufficient documentation

## 2010-07-24 DIAGNOSIS — H9190 Unspecified hearing loss, unspecified ear: Secondary | ICD-10-CM

## 2010-07-24 NOTE — Progress Notes (Signed)
Subjective:    Patient ID: Mark Jordan, male    DOB: 24-Mar-1940, 70 y.o.   MRN: [id number]  HPI 70 y/o WM with known hx of HTN, AR, GERD and Hyperlipidemia   ~  February 05, 2010:  he was eval in ER 01/23/10 for CP> atyp sharp left CP (s/p trauma w/ left humerus surg 9/11 by [name]) w/ neg EKG, neg ENZ, & neg StressEcho per ER protocol (he is reassured since a friend dropped dead last yr while mowing)... he notes intermittent dizziness, some drainage & congestion> we discussed benefits to Rx w/ Meclizine & Mucinex...  BP remains norm on Lotrel;  Chol remains reasonable on Simva40 + diet;  he noted some insomnia- improved on Restoril30 per [name] but he is advised to try the Alpra0.5 at bedtime...  ~  Jun 04, 2010:  4 mo ROV & he notes some weakness, low energy, etc;  He's finished his rehab for his left shoulder & now doing exercises at home, walking, some yard work, denies CP etc;  He tells me that [facility] did some additional GI tests- ?Sonar, Hepatobiliary scan (normal x some symptoms w/ CCK infusion) & sent him to the surgeons but they didn't think his GB needed to be removed... Due for fasting labs today> see prob list below:  ~6/221/12 Work in visit   Pt presents for work in visit. Complains of ear fullness and decreased hearing. He wants his ears washed out. Says his hearing is going over last few years. Wife says he cant hear good. NO ear pain or drainage. No fever.           Problem List:  ALLERGIC RHINITIS (ICD-477.9) - uses OTC meds Prn...  HYPERTENSION (ICD-401.9) - on LOTREL 10-20/d...    CHEST PAIN, ATYPICAL (ICD-786.59) - hx CP and tachy palpit in 2003 w/ neg cardiac eval from [name]... on ASA 81mg /d, and asymptomatic currently. ~  NuclearStressTest 6/03 showed no infarct, ? mild ischemia inferiorly, EF= 57%... ~  [name] 6/03 was negative without stress induced wall motion abn... ~  12/11:  CP eval via ER w/ neg EKG, neg ENZ, & norm  StressEcho...  HYPERCHOLESTEROLEMIA (ICD-272.0) - on SIMVASTATIN 40mg /d + FISH OIL 1000mg /d... ~  FLP 3/09 showed TChol 157, TG 33, HDL 38, LDL 113... Imroved, continue same med + diet. ~  FLP 3/10 showed TChol 145, TG 32, HDL 40, LDL 99 ~  FLP 2/11 showed TChol 199, TG 34, HDL 54, LDL 139... rec> same med, better diet, get wt down. ~  FLP 11/11 showed TChol 168, TG 50, HDL 41, LDL 117 ~  FLP 5/12 on Simva40+FishOil showed TChol 181, TG 30, HDL 41, LDL 134... May need stronger statin med.  GERD (ICD-530.81)  PUD, HX OF (ICD-V12.71) - on PREVACID 30mg Bid now ... followed by [facility] for GI. ~  EGD 2004 showed large HH & schatzki ring... ~  AbdSonar 10/04 was WNL... ~  EDG 1/10 repeated for dysphagia w/ HH, schatzki ring, erosive esophagitis...dilated & PPI incr to Bid...  DIVERTICULOSIS OF COLON (ICD-562.10)  COLONIC POLYPS (ICD-211.3) - GI = [name] and pt reports seen 3/09 w/ colonoscopy showing 4 polyps... f/u planned 1yrs. ~  adm to [name] 5/09 w/ lower GI bleed likely from [employer]- self-limited, didn't req Tx etc...  Hx of CALCULUS, KIDNEY (ICD-592.0) - followed by [id number] & he  also does his PSA's. ~  pt reports PSA 3/10 was <2 ~  labs here 2/11 showed PSA= 1.13 ~  Labs  5/12 showed PSA= 1.67  OSTEOARTHRITIS & ORTHOPEDIC ISSUES: LOW BACK PAIN, CHRONIC (ICD-724.2) - he has a hx of LBP and FM-like symptoms... states doing well w/ exercise program. ~  8/11:  fall from bed of truck w/ left prox humerus fx & surg [name]- lots of rehab but still has decr ROM shoulder.  ANXIETY (ICD-300.00) - takes ALPRAZOLAM 0.5mg  Prn...  Health Maintenance - up to date on colonoscopy and prostate evals... he had TETANUS shot 2003, and PNEUMOVAX at age 56 in 2003- repeat PNEUMOVAX given 2/11 at age 10... he gets yearly Flu shots as well...    Review of Systems Constitutional:   No  weight loss, night sweats,  Fevers, chills, fatigue, or  lassitude.  HEENT:   No headaches,  Difficulty  swallowing,  Tooth/dental problems, or  Sore throat,                No sneezing, itching, ear ache, nasal congestion, post nasal drip,   CV:  No chest pain,  Orthopnea, PND, swelling in lower extremities, anasarca, dizziness, palpitations, syncope.   GI  No heartburn, indigestion, abdominal pain, nausea, vomiting, diarrhea, change in bowel habits, loss of appetite, bloody stools.   Resp: No shortness of breath with exertion or at rest.  No excess mucus, no productive cough,  No non-productive cough,  No coughing up of blood.  No change in color of mucus.  No wheezing.  No chest wall deformity  Skin: no rash or lesions.  GU: no dysuria, change in color of urine, no urgency or frequency.  No flank pain, no hematuria   MS:  No joint pain or swelling.  No decreased range of motion.     Psych:  No change in mood or affect. No depression or anxiety.            Objective:   Physical Exam GEN: A/Ox3; pleasant , NAD, elderly   HEENT:  [place]/AT,  EACs-clear, TMs-wnl, NOSE-clear, THROAT-clear, no lesions, no postnasal drip or exudate noted.  Decreased hearing at 5 feet bialteral   NECK:  Supple w/ fair ROM; no JVD; normal carotid impulses w/o bruits; no thyromegaly or nodules palpated; no lymphadenopathy.  RESP  Clear  P & A; w/o, wheezes/ rales/ or rhonchi.no accessory muscle use, no dullness to percussion  CARD:  RRR, no m/r/g  , no peripheral edema, pulses intact, no cyanosis or clubbing.  GI:   Soft & nt; nml bowel sounds; no organomegaly or masses detected.  Musco: Warm bil, no deformities or joint swelling noted.   Neuro: alert, no focal deficits noted.    Skin: Warm, no lesions or rashes         Assessment & Plan:

## 2010-07-24 NOTE — Assessment & Plan Note (Signed)
Ears are clear w/ no cerumen impaction noted.  Suspect age related hearing as no hx of ear injury or work related hearing impairment/trauma  Plan Refer to audiologist. Pt wants to make his own appointment.

## 2010-07-24 NOTE — Patient Instructions (Signed)
Recommend you see an audiologist to have a hearing check.  May use Claritin 10mg  daily As needed  Drainage Please contact office for sooner follow up if symptoms do not improve or worsen or seek emergency care  follow up as planned with Dr. Kriste Basque

## 2010-10-01 ENCOUNTER — Other Ambulatory Visit: Payer: Self-pay | Admitting: Pulmonary Disease

## 2010-12-07 ENCOUNTER — Other Ambulatory Visit (INDEPENDENT_AMBULATORY_CARE_PROVIDER_SITE_OTHER): Payer: Medicare Other

## 2010-12-07 ENCOUNTER — Ambulatory Visit (INDEPENDENT_AMBULATORY_CARE_PROVIDER_SITE_OTHER): Payer: Medicare Other | Admitting: Pulmonary Disease

## 2010-12-07 ENCOUNTER — Encounter: Payer: Self-pay | Admitting: Pulmonary Disease

## 2010-12-07 DIAGNOSIS — F411 Generalized anxiety disorder: Secondary | ICD-10-CM

## 2010-12-07 DIAGNOSIS — N200 Calculus of kidney: Secondary | ICD-10-CM

## 2010-12-07 DIAGNOSIS — R945 Abnormal results of liver function studies: Secondary | ICD-10-CM

## 2010-12-07 DIAGNOSIS — E7800 Pure hypercholesterolemia, unspecified: Secondary | ICD-10-CM

## 2010-12-07 DIAGNOSIS — M1990 Unspecified osteoarthritis, unspecified site: Secondary | ICD-10-CM

## 2010-12-07 DIAGNOSIS — R7989 Other specified abnormal findings of blood chemistry: Secondary | ICD-10-CM

## 2010-12-07 DIAGNOSIS — K219 Gastro-esophageal reflux disease without esophagitis: Secondary | ICD-10-CM

## 2010-12-07 DIAGNOSIS — D126 Benign neoplasm of colon, unspecified: Secondary | ICD-10-CM

## 2010-12-07 DIAGNOSIS — I10 Essential (primary) hypertension: Secondary | ICD-10-CM

## 2010-12-07 DIAGNOSIS — K5730 Diverticulosis of large intestine without perforation or abscess without bleeding: Secondary | ICD-10-CM

## 2010-12-07 DIAGNOSIS — R0789 Other chest pain: Secondary | ICD-10-CM

## 2010-12-07 LAB — HEPATIC FUNCTION PANEL
ALT: 109 U/L — ABNORMAL HIGH (ref 0–53)
Albumin: 3.9 g/dL (ref 3.5–5.2)
Total Protein: 6.6 g/dL (ref 6.0–8.3)

## 2010-12-07 LAB — LIPID PANEL
Cholesterol: 79 mg/dL (ref 0–200)
HDL: 42 mg/dL (ref >39.00)
Triglycerides: 15 mg/dL (ref 0.0–149.0)
VLDL: 3 mg/dL (ref 0.0–40.0)

## 2010-12-07 MED ORDER — ALPRAZOLAM 0.5 MG PO TABS: ORAL_TABLET | ORAL | Status: DC

## 2010-12-07 NOTE — Progress Notes (Signed)
Subjective:    Patient ID: Mark Jordan, male    DOB: [birth year], 70 y.o.   MRN: [id number]  HPI  70 y/o WM here for a follow up visit... he has mult med probs as noted below...   ~  February 05, 2010:  he was eval in ER 01/23/10 for CP> atyp sharp left CP (s/p trauma w/ left humerus surg 9/11 by [name]) w/ neg EKG, neg ENZ, & neg StressEcho per ER protocol (he is reassured since a friend dropped dead last yr while mowing)... he notes intermittent dizziness, some drainage & congestion> we discussed benefits to Rx w/ Meclizine & Mucinex...  BP remains norm on Lotrel;  Chol remains reasonable on Simva40 + diet;  he noted some insomnia- improved on Restoril30 per [name] but he is advised to try the Alpra0.5 at bedtime...  ~  Jun 04, 2010:  4 mo ROV & he notes some weakness, low energy, etc;  He's finished his rehab for his left shoulder & now doing exercises at home, walking, some yard work, denies CP etc;  He tells me that [facility] did some additional GI tests- ?Sonar, Hepatobiliary scan (normal x some symptoms w/ CCK infusion) & sent him to the surgeons but they didn't think his GB needed to be removed... Due for fasting labs today> see prob list below:  ~  December 07, 2010:  70yo ROV & he reports recent trip to [employer] for abd discomfort & diarrhea> no additional work-up, just given Cipro & Flagyl Rx + bland diet;  He reports improved on Rx & denies lower abd cramping, blood in stool etc;  He is followed by [facility] for GI> his last note from 3/12 is reviewed: Chr reflux on Prev30Bid, ?GB dysfunction, ?had surg consult at that time, f/u colonoscopy due 04/2012...    BP controlled on his Lotrel w/ BP= 128/74 today & similar at home;  He denies recurrent CP, palpit, dizzy, syncope, SOB, or edema;  Chol managed w/ Simva40 & due for f/u FLP (may need stronger statin Rx);  He saw [name] 2wks ago for Urology f/u & he reports doing well, no changes made;  Requesting refill of his Alprazolam  today and he refuses the 2012 Flu vaccine...         Problem List:  ALLERGIC RHINITIS (ICD-477.9) - uses OTC meds Prn...  HYPERTENSION (ICD-401.9) - on LOTREL 10-20/d w/ good control of BP...  CHEST PAIN, ATYPICAL (ICD-786.59) - hx CP and tachy palpit in 2003 w/ neg cardiac eval from [name]... on ASA 81mg /d, and asymptomatic currently. ~  NuclearStressTest 6/03 showed no infarct, ? mild ischemia inferiorly, EF= 57%... ~  [name] 6/03 was negative without stress induced wall motion abn... ~  12/11:  CP eval via ER w/ neg EKG, neg ENZ, & norm StressEcho...  HYPERCHOLESTEROLEMIA (ICD-272.0) - on SIMVASTATIN 40mg /d + FISH OIL 1000mg /d... ~  FLP 3/09 showed TChol 157, TG 33, HDL 38, LDL 113... Imroved, continue same med + diet. ~  FLP 3/10 showed TChol 145, TG 32, HDL 40, LDL 99 ~  FLP 2/11 showed TChol 199, TG 34, HDL 54, LDL 139... rec> same med, better diet, get wt down. ~  FLP 11/11 showed TChol 168, TG 50, HDL 41, LDL 117 ~  FLP 5/12 on Simva40+FishOil showed TChol 181, TG 30, HDL 41, LDL 134... Discussed better diet. ~  FLP 11/12 on Simva40+FishOil showed TChol 79, TG 15, HDL 42, LDL 34... Hard to believe numbers on same med, and  SGOT/ SGPT are elev also;  REC to HOLD Simva40 for 1 month & repeat FLP/ Liver panel at that time...  GERD (ICD-530.81)  PUD, HX OF (ICD-V12.71) - on PREVACID 30mg Bid now ... followed by [facility] for GI. ~  EGD 2004 showed large HH & schatzki ring... ~  AbdSonar 10/04 was WNL... ~  EDG 1/10 repeated for dysphagia w/ HH, schatzki ring, erosive esophagitis...dilated & PPI incr to Bid... ~  [facility] Hepatobiliary scan 3/11 showed patent ducts, GB EF=31.5% (normal>30%), pt did have symptoms during the CCK infusion. ~  [name]'s note indicated surg consultation; pt indicates that they didn't feel he needed GB removed.  DIVERTICULOSIS OF COLON (ICD-562.10)  COLONIC POLYPS (ICD-211.3) - GI = [name] and pt reports seen 3/09 w/ colonoscopy showing 4  polyps... f/u planned 36yrs. ~  adm to [name] 5/09 w/ lower GI bleed likely from [employer]- self-limited, didn't req Tx etc...  Hx of CALCULUS, KIDNEY (ICD-592.0) - followed by [id number] & he  also does his PSA's. ~  pt reports PSA 72/10 was <2 ~  labs here 2/11 showed PSA= 1.13 ~  Labs 5/12 showed PSA= 1.67  OSTEOARTHRITIS & ORTHOPEDIC ISSUES: LOW BACK PAIN, CHRONIC (ICD-724.2) - he has a hx of LBP and FM-like symptoms... states doing well w/ exercise program. ~  8/11:  fall from bed of truck w/ left prox humerus fx & surg [name]- lots of rehab but still has decr ROM shoulder.  ANXIETY (ICD-300.00) - takes ALPRAZOLAM 0.5mg  Prn...  Health Maintenance - up to date on colonoscopy and prostate evals... he had TETANUS shot 2003, and PNEUMOVAX at age 70 in 2003- repeat PNEUMOVAX given 2/11 at age 70... he gets yearly Flu shots as well...   Past Surgical History  Procedure Date  . Inguinal hernia repair     left  . Rotator cuff repair 5/08    left by Dr Rennis Chris  . 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 ALPRAZolam (XANAX) 0.5 MG tablet 1/2 - 1 tab by mouth three times daily as needed for nerves       . amLODipine-benazepril (LOTREL) 10-20 MG per capsule Take 1 capsule by mouth daily.        Marland Kitchen aspirin 81 MG tablet Take 81 mg by mouth daily.        . fish oil-omega-3 fatty acids 1000 MG capsule Take 1 g by mouth daily.        . lansoprazole (PREVACID) 30 MG capsule TAKE 1 CAPSULE TWICE A DAY  180 capsule  2  . meclizine (ANTIVERT) 25 MG tablet 1/2 - 1 tab by mouth up to three times daily as needed for dizziness  90 tablet  5  . simvastatin (ZOCOR) 40 MG tablet TAKE 1 TABLET DAILY  90 tablet  3  . DISCONTD: temazepam (RESTORIL) 30 MG capsule Take 30 mg by mouth at bedtime as needed.          No Known Allergies   Current Medications, Allergies, Past Medical History,  Past Surgical History, Family History, and Social History were reviewed in [employer] record.   Review of Systems         See HPI - all other systems neg except as noted... The patient complains of dyspnea on exertion.  The patient denies anorexia, fever, weight loss, weight gain, vision loss, decreased hearing, hoarseness, chest pain,  syncope, peripheral edema, prolonged cough, headaches, hemoptysis, abdominal pain, melena, hematochezia, severe indigestion/heartburn, hematuria, incontinence, muscle weakness, suspicious skin lesions, transient blindness, difficulty walking, depression, unusual weight change, abnormal bleeding, enlarged lymph nodes, and angioedema.   Objective:   Physical Exam      WD, WN, 70 y/o WM in NAD... GENERAL:  Alert & oriented; pleasant & cooperative... HEENT:  [place]/AT, EOM-full, PERRLA, EACs-clear, TMs-wnl, NOSE-clear, THROAT-clear & wnl. NECK:  Supple w/ fairROM; no JVD; normal carotid impulses w/o bruits; no thyromegaly or nodules palpated; no lymphadenopathy. CHEST:  Clear to P & A; without wheezes/ rales/ or rhonchi heard...  HEART:  Regular Rhythm; without murmurs/ rubs/ or gallops detected... ABDOMEN:  Soft & nontender; normal bowel sounds; no organomegaly or masses palpated... EXT:  s/p left humerus surg, mild arthritic changes; no varicose veins/ venous insuffic/ or edema. NEURO:  CN's intact; motor testing normal; sensory testing normal; gait normal & balance OK. DERM:  No lesions noted; periumbil rash improved...   Assessment & Plan:   GI> Recent abd pain/ diarrhea eval at Urgent Care & given Cipro/ Flagyl empirically w/ improvement; he will finish these meds & suggested f/u [facility];  Note unusual Lipid results and incr LFTs could be due to Statin, but he has tolerated for yrs> plan to HOLD Simva40 x35mo & recheck labs; could also be from GB/ hepatobiliary dis & I couldn't find Sonar x for 2004, therefore proceed w/ Abd Sonar ==> we  will get results to [facility].   HBP>  Controlled on Lotrel + diet/ exercise, reminded to elim salt etc...  Hx of CP>  On ASA, no further prob since 12/11 trip to ER w/ neg eval at that time...  CHOL>  On Simva40 + diet efforts but labs today w/ hard to believe numbers (see above); could be related to GB prob? & we will HOLD the Simva40 for 1 month & repeat FLP & LFTs as noted...  ORTHO>  DJD, LBP, etc>  He is still doing his home therapy for the left shoulder injury but has permanent deficit in shoulder motion...  ANXIETY>  Continue Alpraz prn use.Marland KitchenMarland Kitchen

## 2010-12-07 NOTE — Patient Instructions (Signed)
Today we updated your med list in our EPIC system...    Continue your current medications the same...    Finish out the Cipro & Flagyl given to you at the [facility]...    Be sure to take the METAMUCIL daily...  Today we did your follow up fasting blood work...    Please call the [facility] in a few days for your results...    Dial [id number] & when prompted enter your patient number followed by the # symbol...    Your patient number is:  [id number]#  At your convenience, contact [facility] for a GI follow up eval...  Call for any questions...  Let's plan a follow up visit in 6 months (& we will do your FULL lab screen at that time).Marland KitchenMarland Kitchen

## 2010-12-08 ENCOUNTER — Telehealth: Payer: Self-pay | Admitting: Pulmonary Disease

## 2010-12-08 ENCOUNTER — Other Ambulatory Visit: Payer: Self-pay | Admitting: Pulmonary Disease

## 2010-12-08 DIAGNOSIS — R748 Abnormal levels of other serum enzymes: Secondary | ICD-10-CM

## 2010-12-08 DIAGNOSIS — E7800 Pure hypercholesterolemia, unspecified: Secondary | ICD-10-CM

## 2010-12-08 NOTE — Telephone Encounter (Signed)
SN has spoken with pt about his lab results.

## 2010-12-08 NOTE — Telephone Encounter (Signed)
Pt stopped by the office & would like to get results if possible.  Pt stated he can be reached at his office number, [phone], between 12-3 today.  Antionette Fairy

## 2010-12-10 ENCOUNTER — Telehealth: Payer: Self-pay | Admitting: Pulmonary Disease

## 2010-12-10 NOTE — Telephone Encounter (Signed)
Left message with male secretary for pt to be NPO after midnight and to arrive approx 30 early for paperwork.

## 2010-12-11 ENCOUNTER — Ambulatory Visit (HOSPITAL_COMMUNITY)
Admission: RE | Admit: 2010-12-11 | Discharge: 2010-12-11 | Disposition: A | Discharge status: Home / Self Care | Payer: Medicare Other | Source: Ambulatory Visit | Attending: Pulmonary Disease | Admitting: Pulmonary Disease

## 2010-12-11 DIAGNOSIS — N281 Cyst of kidney, acquired: Secondary | ICD-10-CM | POA: Insufficient documentation

## 2010-12-11 DIAGNOSIS — N289 Disorder of kidney and ureter, unspecified: Secondary | ICD-10-CM | POA: Insufficient documentation

## 2010-12-11 DIAGNOSIS — R748 Abnormal levels of other serum enzymes: Secondary | ICD-10-CM

## 2011-01-07 ENCOUNTER — Other Ambulatory Visit (INDEPENDENT_AMBULATORY_CARE_PROVIDER_SITE_OTHER): Payer: Medicare Other

## 2011-01-07 DIAGNOSIS — E7800 Pure hypercholesterolemia, unspecified: Secondary | ICD-10-CM

## 2011-01-07 LAB — HEPATIC FUNCTION PANEL
AST: 18 U/L (ref 0–37)
Albumin: 4.1 g/dL (ref 3.5–5.2)
Alkaline Phosphatase: 57 U/L (ref 39–117)
Total Protein: 7 g/dL (ref 6.0–8.3)

## 2011-01-07 LAB — LIPID PANEL
Cholesterol: 195 mg/dL (ref 0–200)
Triglycerides: 35 mg/dL (ref 0.0–149.0)

## 2011-01-11 ENCOUNTER — Telehealth: Payer: Self-pay | Admitting: Pulmonary Disease

## 2011-01-11 DIAGNOSIS — E7800 Pure hypercholesterolemia, unspecified: Secondary | ICD-10-CM

## 2011-01-11 NOTE — Telephone Encounter (Signed)
Called and spoke with pt and he is aware of lab results per SN---pt is aware to restart on simvastatin 20mg  daily and to recheck lipid and hepatic panel in 3 months.  Orders already placed in computer for the pt to return for these labs in march.  Med list has been updated.

## 2011-01-29 ENCOUNTER — Telehealth: Payer: Self-pay | Admitting: Pulmonary Disease

## 2011-01-29 NOTE — Telephone Encounter (Signed)
Soreness on right side of neck up toward the base of the skull, hurts worse when holding head up x2weeks.  Robaxin 500mg  not helping.  Difficulty sleeping.  Denies any muscle strain/pulled muscle.  Tingling at collarbone when lying on back x15 secs, but is sporadic.  Has not been doing any warm compresses.  Neither SN nor TP in office today.  TP has no openings until 1.4.13.  Dr Sherene Sires please advise, thanks!  NKDA.  [facility].

## 2011-01-29 NOTE — Telephone Encounter (Signed)
LM with coworker TCB x1.

## 2011-01-29 NOTE — Telephone Encounter (Signed)
Heat and advil with meals prn f/u Tammy 1/4

## 2011-01-29 NOTE — Telephone Encounter (Signed)
ATC line busy x 3 wcb 

## 2011-02-01 NOTE — Telephone Encounter (Signed)
Spoke with patient advised him to use heat and advil with meals,and ok to use 10oclock per jess.

## 2011-02-05 ENCOUNTER — Encounter: Payer: Self-pay | Admitting: Adult Health

## 2011-02-05 ENCOUNTER — Ambulatory Visit (INDEPENDENT_AMBULATORY_CARE_PROVIDER_SITE_OTHER): Payer: Medicare Other | Admitting: Adult Health

## 2011-02-05 VITALS — BP 122/78 | HR 65 | Temp 97.7°F | Ht 69.0 in | Wt 170.2 lb

## 2011-02-05 DIAGNOSIS — S139XXA Sprain of joints and ligaments of unspecified parts of neck, initial encounter: Secondary | ICD-10-CM

## 2011-02-05 DIAGNOSIS — S161XXA Strain of muscle, fascia and tendon at neck level, initial encounter: Secondary | ICD-10-CM | POA: Insufficient documentation

## 2011-02-05 MED ORDER — METAXALONE 800 MG PO TABS: 800.0000 mg | ORAL_TABLET | Freq: Three times a day (TID) | ORAL | Status: AC | PRN

## 2011-02-05 MED ORDER — HYDROCODONE-ACETAMINOPHEN 5-500 MG PO TABS: 1.0000 | ORAL_TABLET | Freq: Three times a day (TID) | ORAL | Status: DC | PRN

## 2011-02-05 MED ORDER — MELOXICAM 15 MG PO TABS: 15.0000 mg | ORAL_TABLET | Freq: Every day | ORAL | Status: DC | PRN

## 2011-02-05 NOTE — Assessment & Plan Note (Signed)
Mobic 15mg  daily with food for 7 days and As needed  Joint pain  Skelaxin 800mg  Three times a day  As needed  Muscle strain Vicodin 1 every 8 hr As needed  Severe pain -may make you sleepy Alternate ice and heat to neck/upper back As needed   Please contact office for sooner follow up if symptoms do not improve or worsen or seek emergency care  If not improving will need to be seen by orthopedics

## 2011-02-05 NOTE — Patient Instructions (Signed)
Mobic 15mg daily with food for 7 days and As needed  Joint pain  Skelaxin 800mg Three times a day  As needed  Muscle strain Vicodin 1 every 8 hr As needed  Severe pain -may make you sleepy Alternate ice and heat to neck/upper back As needed   Please contact office for sooner follow up if symptoms do not improve or worsen or seek emergency care  If not improving will need to be seen by orthopedics    

## 2011-02-05 NOTE — Progress Notes (Signed)
Subjective:    Patient ID: Mark Jordan, male    DOB: 1940-05-17, 71 y.o.   MRN: [id number]  HPI  71 y/o WM    ~  February 05, 2010:  he was eval in ER 01/23/10 for CP> atyp sharp left CP (s/p trauma w/ left humerus surg 9/11 by [name]) w/ neg EKG, neg ENZ, & neg StressEcho per ER protocol (he is reassured since a friend dropped dead last yr while mowing)... he notes intermittent dizziness, some drainage & congestion> we discussed benefits to Rx w/ Meclizine & Mucinex...  BP remains norm on Lotrel;  Chol remains reasonable on Simva40 + diet;  he noted some insomnia- improved on Restoril30 per [name] but he is advised to try the Alpra0.5 at bedtime...  ~  Jun 04, 2010:  4 mo ROV & he notes some weakness, low energy, etc;  He's finished his rehab for his left shoulder & now doing exercises at home, walking, some yard work, denies CP etc;  He tells me that [facility] did some additional GI tests- ?Sonar, Hepatobiliary scan (normal x some symptoms w/ CCK infusion) & sent him to the surgeons but they didn't think his GB needed to be removed... Due for fasting labs today> see prob list below:  ~  December 07, 2010:  73mo ROV & he reports recent trip to [place] for abd discomfort & diarrhea> no additional work-up, just given Cipro & Flagyl Rx + bland diet;  He reports improved on Rx & denies lower abd cramping, blood in stool etc;  He is followed by [facility] for GI> his last note from 3/12 is reviewed: Chr reflux on Prev30Bid, ?GB dysfunction, ?had surg consult at that time, f/u colonoscopy due 04/2012...    BP controlled on his Lotrel w/ BP= 128/74 today & similar at home;  He denies recurrent CP, palpit, dizzy, syncope, SOB, or edema;  Chol managed w/ Simva40 & due for f/u FLP (may need stronger statin Rx);  He saw [name] 2wks ago for Urology f/u & he reports doing well, no changes made;  Requesting refill of his Alprazolam today and he refuses the 2012 Flu vaccine...  02/05/2011 Acute OV    Complains of  neck pain x 2 wks,has used heat and advil with some relief. Has soreness along back of neck esp when he flexes and extends neck. Worse with computer use. Works on [employer] 4-5 hrs daily .  Started advil (200mg  Three times a day  )  and muscle relaxer (?name)  2 weeks ago with some help.  No known injury . No arm weakness. No rash. No headache. No radicular symptoms.             Problem List:  ALLERGIC RHINITIS (ICD-477.9) - uses OTC meds Prn...  HYPERTENSION (ICD-401.9) - on LOTREL 10-20/d w/ good control of BP...  CHEST PAIN, ATYPICAL (ICD-786.59) - hx CP and tachy palpit in 2003 w/ neg cardiac eval from [name]... on ASA 81mg /d, and asymptomatic currently. ~  NuclearStressTest 6/03 showed no infarct, ? mild ischemia inferiorly, EF= 57%... ~  [name] 6/03 was negative without stress induced wall motion abn... ~  12/11:  CP eval via ER w/ neg EKG, neg ENZ, & norm StressEcho...  HYPERCHOLESTEROLEMIA (ICD-272.0) - on SIMVASTATIN 40mg /d + FISH OIL 1000mg /d... ~  FLP 3/09 showed TChol 157, TG 33, HDL 38, LDL 113... Imroved, continue same med + diet. ~  FLP 3/10 showed TChol 145, TG 32, HDL 40, LDL 99 ~  FLP 2/11 showed TChol 199, TG 34, HDL 54, LDL 139... rec> same med, better diet, get wt down. ~  FLP 11/11 showed TChol 168, TG 50, HDL 41, LDL 117 ~  FLP 5/12 on Simva40+FishOil showed TChol 181, TG 30, HDL 41, LDL 134... Discussed better diet. ~  FLP 11/12 on Simva40+FishOil showed TChol 79, TG 15, HDL 42, LDL 34... Hard to believe numbers on same med, and SGOT/ SGPT are elev also;  REC to HOLD Simva40 for 1 month & repeat FLP/ Liver panel at that time...  GERD (ICD-530.81)  PUD, HX OF (ICD-V12.71) - on PREVACID 30mg Bid now ... followed by [facility] for GI. ~  EGD 2004 showed large HH & schatzki ring... ~  AbdSonar 10/04 was WNL... ~  EDG 1/10 repeated for dysphagia w/ HH, schatzki ring, erosive esophagitis...dilated & PPI incr to Bid... ~  [facility] Hepatobiliary  scan 3/11 showed patent ducts, GB EF=31.5% (normal>30%), pt did have symptoms during the CCK infusion. ~  [name]'s note indicated surg consultation; pt indicates that they didn't feel he needed GB removed.  DIVERTICULOSIS OF COLON (ICD-562.10)  COLONIC POLYPS (ICD-211.3) - GI = [name] and pt reports seen 3/09 w/ colonoscopy showing 4 polyps... f/u planned 84yrs. ~  adm to [name] 5/09 w/ lower GI bleed likely from [employer]- self-limited, didn't req Tx etc...  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 ~  pt reports PSA 3/10 was <2 ~  labs here 2/11 showed PSA= 1.13 ~  Labs 5/12 showed PSA= 1.67  OSTEOARTHRITIS & ORTHOPEDIC ISSUES: LOW BACK PAIN, CHRONIC (ICD-724.2) - he has a hx of LBP and FM-like symptoms... states doing well w/ exercise program. ~  8/11:  fall from bed of truck w/ left prox humerus fx & surg [name]- lots of rehab but still has decr ROM shoulder.  ANXIETY (ICD-300.00) - takes ALPRAZOLAM 0.5mg  Prn...  Health Maintenance - up to date on colonoscopy and prostate evals... he had TETANUS shot 2003, and PNEUMOVAX at age 50 in 2003- repeat PNEUMOVAX given 2/11 at age 41... he gets yearly Flu shots as well...   Past Surgical History  Procedure Date  . Inguinal hernia repair     left  . Rotator cuff repair 5/08    left by Dr Rennis Chris  . 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 ALPRAZolam (XANAX) 0.5 MG tablet 1/2 - 1 tab by mouth three times daily as needed for nerves  90 tablet  5  . amLODipine-benazepril (LOTREL) 10-20 MG per capsule Take 1 capsule by mouth daily.        Marland Kitchen aspirin 81 MG tablet Take 81 mg by mouth daily.        . fish oil-omega-3 fatty acids 1000 MG capsule Take 1 g by mouth daily.        . lansoprazole (PREVACID) 30 MG capsule TAKE 1 CAPSULE TWICE A DAY  180 capsule  2  . meclizine  (ANTIVERT) 25 MG tablet 1/2 - 1 tab by mouth up to three times daily as needed for dizziness  90 tablet  5  . simvastatin (ZOCOR) 40 MG tablet Take 20 mg by mouth at bedtime.       . Tamsulosin HCl (FLOMAX) 0.4 MG CAPS         No Known Allergies   Current Medications, Allergies,  Past Medical History, Past Surgical History, Family History, and Social History were reviewed in [employer] record.   Review of Systems          Constitutional:   No  weight loss, night sweats,  Fevers, chills,  +fatigue, or  lassitude.  HEENT:   No headaches,  Difficulty swallowing,  Tooth/dental problems, or  Sore throat,                No sneezing, itching, ear ache, nasal congestion, post nasal drip,   CV:  No chest pain,  Orthopnea, PND, swelling in lower extremities, anasarca, dizziness, palpitations, syncope.   GI  No heartburn, indigestion, abdominal pain, nausea, vomiting, diarrhea, change in bowel habits, loss of appetite, bloody stools.   Resp: No shortness of breath with exertion or at rest.  No excess mucus, no productive cough,  No non-productive cough,  No coughing up of blood.  No change in color of mucus.  No wheezing.  No chest wall deformity  Skin: no rash or lesions.  GU: no dysuria, change in color of urine, no urgency or frequency.  No flank pain, no hematuria   MS:  No joint   swelling.   No back pain.  Psych:  No change in mood or affect. No depression or anxiety.  No memory loss.        Objective:   Physical Exam      WD, WN, 71 y/o WM in NAD... GENERAL:  Alert & oriented; pleasant & cooperative... HEENT:  /AT, EOM-full, PERRLA, EACs-clear, TMs-wnl, NOSE-clear, THROAT-clear & wnl. NECK:  Supple w/ fairROM; no JVD; normal carotid impulses w/o bruits; no thyromegaly or nodules palpated; no lymphadenopathy. CHEST:  Clear to P & A; without wheezes/ rales/ or rhonchi heard...  HEART:  Regular Rhythm; without murmurs/ rubs/ or gallops  detected... ABDOMEN:  Soft & nontender; normal bowel sounds; no organomegaly or masses palpated... EXT:  s/p left humerus surg, mild arthritic changes; no varicose veins/ venous insuffic/ or edema. nml grips, decreased neck ROM, left shoulder poor ROM -chronic from surgery Right 4/5 strength, slightly decreased ROM  Tenderness along right lateral neck and suprascapular region. No deformity or swelling/rash/redness noted.    motor testing normal; sensory testing normal; gait normal & balance OK. DERM:  No lesions noted   Assessment & Plan:

## 2011-02-23 ENCOUNTER — Telehealth: Payer: Self-pay | Admitting: Pulmonary Disease

## 2011-02-23 ENCOUNTER — Encounter: Payer: Self-pay | Admitting: Cardiology

## 2011-02-23 ENCOUNTER — Ambulatory Visit (INDEPENDENT_AMBULATORY_CARE_PROVIDER_SITE_OTHER): Payer: Medicare Other | Admitting: Cardiology

## 2011-02-23 DIAGNOSIS — E7800 Pure hypercholesterolemia, unspecified: Secondary | ICD-10-CM

## 2011-02-23 DIAGNOSIS — R079 Chest pain, unspecified: Secondary | ICD-10-CM

## 2011-02-23 DIAGNOSIS — R0789 Other chest pain: Secondary | ICD-10-CM

## 2011-02-23 DIAGNOSIS — I10 Essential (primary) hypertension: Secondary | ICD-10-CM

## 2011-02-23 NOTE — Patient Instructions (Signed)
Your physician recommends that you schedule a follow-up appointment in: AS NEEDED  

## 2011-02-23 NOTE — Progress Notes (Signed)
HPI: 71 year old male for evaluation of chest pain. Stress echocardiogram in December of 2011 was normal. Patient states he has had intermittent chest pain since the 1970s. The pain is in the left chest area. It does not radiate. It is not exertional, pleuritic but can be related to food. It lasts 5 minutes and resolves spontaneously. No associated symptoms. It improves with drinking Coke and belching. Note he does not have exertional chest pain, dyspnea on exertion, orthopnea, PND, pedal edema or syncope.  Current Outpatient Prescriptions  Medication Sig Dispense Refill  . ALPRAZolam (XANAX) 0.5 MG tablet 1/2 - 1 tab by mouth three times daily as needed for nerves  90 tablet  5  . amLODipine-benazepril (LOTREL) 10-20 MG per capsule Take 1 capsule by mouth daily.        Marland Kitchen aspirin 81 MG tablet Take 81 mg by mouth daily.        . fish oil-omega-3 fatty acids 1000 MG capsule Take 1 g by mouth daily.        Marland Kitchen HYDROcodone-acetaminophen (VICODIN) 5-500 MG per tablet Take 1 tablet by mouth every 8 (eight) hours as needed for pain.  20 tablet  0  . lansoprazole (PREVACID) 30 MG capsule TAKE 1 CAPSULE TWICE A DAY  180 capsule  2  . meclizine (ANTIVERT) 25 MG tablet 1/2 - 1 tab by mouth up to three times daily as needed for dizziness  90 tablet  5  . meloxicam (MOBIC) 15 MG tablet Take 1 tablet (15 mg total) by mouth daily as needed for pain.  30 tablet  2  . simvastatin (ZOCOR) 40 MG tablet Take 20 mg by mouth at bedtime.       . Tamsulosin HCl (FLOMAX) 0.4 MG CAPS         No Known Allergies  Past Medical History  Diagnosis Date  . Allergic rhinitis   . HTN (hypertension)   . Hypercholesterolemia   . GERD (gastroesophageal reflux disease)   . PUD (peptic ulcer disease)   . Diverticulosis of colon   . Kidney calculus   . Chronic low back pain   . Anxiety     Past Surgical History  Procedure Date  . Inguinal hernia repair     left  . Rotator cuff repair 5/08    left by Dr Rennis Chris  . Total  shoulder arthroplasty 8/11    after fall w/ prox humerus fx  . Leg surgery     History   Social History  . Marital Status: Married    Spouse Name: Mark Jordan    Number of Children: 3  . Years of Education: N/A   Occupational History  . OWNER    Social History Main Topics  . Smoking status: Never Smoker   . Smokeless tobacco: Never Used  . Alcohol Use: No  . Drug Use: No  . Sexually Active: Not on file   Other Topics Concern  . Not on file   Social History Narrative   Does not exercise1 cup coffee a day4 siblings all in good health    Family History  Problem Relation Age of Onset  . Heart failure Father   . Pneumonia Mother     ROS: Problems with left shoulder pain from previous surgery but  no fevers or chills, productive cough, hemoptysis, dysphasia, odynophagia, melena, hematochezia, dysuria, hematuria, rash, seizure activity, orthopnea, PND, pedal edema, claudication. Remaining systems are negative.  Physical Exam:   Blood pressure 119/75, pulse 86, height 5'  9" (1.753 m), weight 165 lb (74.844 kg).  General:  Well developed/well nourished anxious appearing in NAD Skin warm/dry Patient not depressed No peripheral clubbing Back-normal HEENT-normal/normal eyelids Neck supple/normal carotid upstroke bilaterally; no bruits; no JVD; no thyromegaly chest - CTA/ normal expansion CV - RRR/normal S1 and S2; no murmurs, rubs or gallops;  PMI nondisplaced Abdomen -NT/ND, no HSM, no mass, + bowel sounds, no bruit 2+ femoral pulses, no bruits Ext-no edema, chords, 2+ DP Neuro-grossly nonfocal  ECG normal sinus rhythm at a rate of 82. Axis normal. No ST changes.

## 2011-02-23 NOTE — Assessment & Plan Note (Addendum)
Continue statin; note patient is on both Zocor and amlodipine. I will ask him to discuss this with Dr. Kriste Basque. There is increased risk of interaction between these 2 medications and a different statin is most likely warranted. I will leave this to primary care.

## 2011-02-23 NOTE — Telephone Encounter (Signed)
I spoke with pt and he is requesting to be referred to a cardiologist to evaluate for chest pains. Pt states this comes and goes and he also wants to make sure he does not have any blockages. Please advise Dr. Kriste Basque, thanks

## 2011-02-23 NOTE — Telephone Encounter (Signed)
I spoke with pt and is aware referral to cardiology has been placed and he will be contacted with an apt.

## 2011-02-23 NOTE — Assessment & Plan Note (Signed)
Blood pressure controlled. Continue present medications. 

## 2011-02-23 NOTE — Telephone Encounter (Signed)
Per SN---ok for pt to get appt with cardiology---order has been placed already.  thanks

## 2011-02-23 NOTE — Assessment & Plan Note (Signed)
Patient symptoms are extremely atypical. They have been intermittent since the 1970s. They are relieved with drinking Coke and belching. No exertional component. Previous stress echocardiogram normal. Electrocardiogram normal. I offered an exercise treadmill but the patient declined. I do not think his pain is cardiac in etiology.

## 2011-02-25 ENCOUNTER — Telehealth: Payer: Self-pay | Admitting: *Deleted

## 2011-02-25 NOTE — Telephone Encounter (Signed)
Left message for pt to call, the pt is currently taking amlodipine and simvastatin. Per dr Jens Som the pt will need to follow up with dr nadel regarding the interaction between these two meds.

## 2011-03-03 NOTE — Telephone Encounter (Signed)
Unable to reach the pt, he has not returned my calls. Will forward for dr nadel's review.

## 2011-06-07 ENCOUNTER — Ambulatory Visit: Payer: Medicare Other | Admitting: Pulmonary Disease

## 2011-07-06 ENCOUNTER — Telehealth: Payer: Self-pay | Admitting: Pulmonary Disease

## 2011-07-06 DIAGNOSIS — D126 Benign neoplasm of colon, unspecified: Secondary | ICD-10-CM

## 2011-07-06 DIAGNOSIS — I10 Essential (primary) hypertension: Secondary | ICD-10-CM

## 2011-07-06 DIAGNOSIS — F411 Generalized anxiety disorder: Secondary | ICD-10-CM

## 2011-07-06 NOTE — Telephone Encounter (Signed)
Order for labs are in the computer for the pt.  thanks

## 2011-07-06 NOTE — Telephone Encounter (Signed)
Please advise what labs will need to be done Dr. Kriste Basque, thanks

## 2011-07-06 NOTE — Telephone Encounter (Signed)
LMOM informing pt that the lab orders are in the computer and to be fasting when he comes in. Asked to call back only if has questions or further needs.

## 2011-07-21 ENCOUNTER — Ambulatory Visit (INDEPENDENT_AMBULATORY_CARE_PROVIDER_SITE_OTHER): Payer: Medicare Other | Admitting: Pulmonary Disease

## 2011-07-21 ENCOUNTER — Encounter: Payer: Self-pay | Admitting: Pulmonary Disease

## 2011-07-21 ENCOUNTER — Other Ambulatory Visit (INDEPENDENT_AMBULATORY_CARE_PROVIDER_SITE_OTHER): Payer: Medicare Other

## 2011-07-21 VITALS — BP 142/90 | HR 59 | Temp 97.0°F | Ht 69.0 in | Wt 166.4 lb

## 2011-07-21 DIAGNOSIS — K219 Gastro-esophageal reflux disease without esophagitis: Secondary | ICD-10-CM

## 2011-07-21 DIAGNOSIS — D126 Benign neoplasm of colon, unspecified: Secondary | ICD-10-CM

## 2011-07-21 DIAGNOSIS — N320 Bladder-neck obstruction: Secondary | ICD-10-CM

## 2011-07-21 DIAGNOSIS — E7800 Pure hypercholesterolemia, unspecified: Secondary | ICD-10-CM

## 2011-07-21 DIAGNOSIS — I10 Essential (primary) hypertension: Secondary | ICD-10-CM

## 2011-07-21 DIAGNOSIS — N139 Obstructive and reflux uropathy, unspecified: Secondary | ICD-10-CM

## 2011-07-21 DIAGNOSIS — N200 Calculus of kidney: Secondary | ICD-10-CM

## 2011-07-21 DIAGNOSIS — F411 Generalized anxiety disorder: Secondary | ICD-10-CM

## 2011-07-21 DIAGNOSIS — M545 Low back pain: Secondary | ICD-10-CM

## 2011-07-21 DIAGNOSIS — M1990 Unspecified osteoarthritis, unspecified site: Secondary | ICD-10-CM

## 2011-07-21 DIAGNOSIS — K5730 Diverticulosis of large intestine without perforation or abscess without bleeding: Secondary | ICD-10-CM

## 2011-07-21 DIAGNOSIS — Z23 Encounter for immunization: Secondary | ICD-10-CM

## 2011-07-21 DIAGNOSIS — R0789 Other chest pain: Secondary | ICD-10-CM

## 2011-07-21 LAB — HEPATIC FUNCTION PANEL
ALT: 26 U/L (ref 0–53)
Albumin: 4.3 g/dL (ref 3.5–5.2)
Alkaline Phosphatase: 61 U/L (ref 39–117)
Bilirubin, Direct: 0.1 mg/dL (ref 0.0–0.3)
Total Protein: 7.1 g/dL (ref 6.0–8.3)

## 2011-07-21 LAB — CBC WITH DIFFERENTIAL/PLATELET
Basophils Absolute: 0 10*3/uL (ref 0.0–0.1)
HCT: 45.7 % (ref 39.0–52.0)
Lymphs Abs: 1.9 10*3/uL (ref 0.7–4.0)
MCHC: 33.1 g/dL (ref 30.0–36.0)
MCV: 93.1 fl (ref 78.0–100.0)
Monocytes Absolute: 0.5 10*3/uL (ref 0.1–1.0)
Platelets: 187 10*3/uL (ref 150.0–400.0)
RDW: 13.5 % (ref 11.5–14.6)

## 2011-07-21 LAB — BASIC METABOLIC PANEL
CO2: 28 mEq/L (ref 19–32)
Calcium: 8.9 mg/dL (ref 8.4–10.5)
Chloride: 107 mEq/L (ref 96–112)
Creatinine, Ser: 0.7 mg/dL (ref 0.4–1.5)
Glucose, Bld: 83 mg/dL (ref 70–99)

## 2011-07-21 LAB — LIPID PANEL
HDL: 43.4 mg/dL (ref >39.00)
Triglycerides: 43 mg/dL (ref 0.0–149.0)

## 2011-07-21 LAB — TSH: TSH: 1.86 u[IU]/mL (ref 0.35–5.50)

## 2011-07-21 MED ORDER — MECLIZINE HCL 25 MG PO TABS: ORAL_TABLET | ORAL | Status: DC

## 2011-07-21 NOTE — Patient Instructions (Addendum)
Today we updated your med list in our EPIC system...    Continue your current medications the same...  Today we did your follow up FASTING blood work...    we will call you w/ the results when avail...  Today we also gave you the combination TETANUS vaccine called the TDAP- it should be good for 10 yrs...  Call for any questions...  Let's plan a follow up visit in 6 months.Marland KitchenMarland Kitchen

## 2011-07-24 ENCOUNTER — Encounter: Payer: Self-pay | Admitting: Pulmonary Disease

## 2011-07-24 NOTE — Progress Notes (Signed)
Subjective:    Patient ID: Mark Jordan, male    DOB: May 18, 1940, 71 y.o.   MRN: [id number]  HPI 71 y/o WM here for a follow up visit... he has mult med probs as noted below...   ~  February 05, 2010:  he was eval in ER 01/23/10 for CP> atyp sharp left CP (s/p trauma w/ left humerus surg 9/11 by [name]) w/ neg EKG, neg ENZ, & neg StressEcho per ER protocol (he is reassured since a friend dropped dead last yr while mowing)... he notes intermittent dizziness, some drainage & congestion> we discussed benefits to Rx w/ Meclizine & Mucinex...  BP remains norm on Lotrel;  Chol remains reasonable on Simva40 + diet;  he noted some insomnia- improved on Restoril30 per [name] but he is advised to try the Alpra0.5 at bedtime...  ~  Jun 04, 2010:  4 mo ROV & he notes some weakness, low energy, etc;  He's finished his rehab for his left shoulder & now doing exercises at home, walking, some yard work, denies CP etc;  He tells me that [facility] did some additional GI tests- ?Sonar, Hepatobiliary scan (normal x some symptoms w/ CCK infusion) & sent him to the surgeons but they didn't think his GB needed to be removed... Due for fasting labs today> see prob list below:  ~  December 07, 2010:  32mo ROV & he reports recent trip to [employer] for abd discomfort & diarrhea> no additional work-up, just given Cipro & Flagyl Rx + bland diet;  He reports improved on Rx & denies lower abd cramping, blood in stool etc;  He is followed by [facility] for GI> his last note from 3/12 is reviewed: Chr reflux on Prev30Bid, ?GB dysfunction, ?had surg consult at that time, f/u colonoscopy due 04/2012...    BP controlled on his Lotrel w/ BP= 128/74 today & similar at home;  He denies recurrent CP, palpit, dizzy, syncope, SOB, or edema;  Chol managed w/ Simva40 & due for f/u FLP (may need stronger statin Rx);  He saw [name] 2wks ago for Urology f/u & he reports doing well, no changes made;  Requesting refill of his Alprazolam today  and he refuses the 2012 Flu vaccine...  ~  July 21, 2011:  26mo ROV & Mark Jordan has had a good interval w/o new complaints or concerns; he tells me he is not taking Mobic or Vicodin 7 just uses Tylenol for discomfort- we updated his EPIC med list accordingly... We gave him the TDAP vaccination today... HBP> on Lotrel10-20; BP= 142/90 & we reviewed the need for low sodium diet & home BP monitoring... 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 Chol> on Simva40- taking 1/2 daily; FLP showed TChol 170, TG 43, HDL 43, LDL 118; decided on same med, better diet... GI> GERD, HxPUD, Divertics, Polyps> followed by [facility] & he remains on Prev30Bid; pt didn't mention Dexilant or BiosLifeSlim today... GU> Hx KidStone, BPH> followed by [facility] on Flomax0.4; PSA is wnl at 0.95... Ortho> DJD, LBP, left shoulder injury> followed by [name] but not on any prescription meds- just Tylenol etc OTC...  Anxiety> on Xanax0.5mg  prn...    We reviewed prob list, meds, xrays and labs> see below>> LABS 6/13:  FLP- ok x LDL=118 on Simva20;  Chems- wnl;  CBC- wnl;  TSH=1.86;  PSA=0.95         Problem List:  ALLERGIC RHINITIS (ICD-477.9) - uses OTC meds Prn.Marland KitchenMarland Kitchen  HYPERTENSION (ICD-401.9) - on LOTREL 10-20/d w/ good control of BP...  CHEST PAIN, ATYPICAL (ICD-786.59) - hx CP and tachy palpit in 2003 w/ neg cardiac eval from [name]... on ASA 81mg /d, and asymptomatic currently. ~  NuclearStressTest 6/03 showed no infarct, ? mild ischemia inferiorly, EF= 57%... ~  [name] 6/03 was negative without stress induced wall motion abn... ~  12/11:  CP eval via ER w/ neg EKG, neg ENZ, & norm StressEcho... ~  1/13:  He had f/u [name]> doing satis w/o angina, occas atypCP; no changes made.Marland Kitchen  HYPERCHOLESTEROLEMIA (ICD-272.0) - on SIMVASTATIN 20mg /d now (40mg - 1/2 tab) + FISH OIL 1000mg /d... ~  FLP 3/09 showed TChol 157, TG 33, HDL 38, LDL 113... Imroved, continue same med +  diet. ~  FLP 3/10 showed TChol 145, TG 32, HDL 40, LDL 99 ~  FLP 2/11 showed TChol 199, TG 34, HDL 54, LDL 139... rec> same med, better diet, get wt down. ~  FLP 11/11 showed TChol 168, TG 50, HDL 41, LDL 117 ~  FLP 5/12 on Simva40+FishOil showed TChol 181, TG 30, HDL 41, LDL 134... Discussed better diet. ~  FLP 11/12 on Simva40+FishOil showed TChol 79, TG 15, HDL 42, LDL 34... Hard to believe numbers on same med, and SGOT/ SGPT are elev also;  REC to HOLD Simva40 for 1 month & repeat FLP/ Liver panel at that time... ~  Repeat FLP 12/12 off Simva for 30d showed TChol 195, TG 35, HDL 44, LDL 144, and LFTs were back to normal; rec start Simva20. ~  FLP 6/13 on Simva20 showed TChol 170, TG 43, HDL 43, LDL 118... Keep same + better diet.  GERD (ICD-530.81)  PUD, HX OF (ICD-V12.71) - on PREVACID 30mg Bid now ... followed by [facility] for GI. ~  EGD 2004 showed large HH & schatzki ring... ~  AbdSonar 10/04 was WNL... ~  EDG 1/10 repeated for dysphagia w/ HH, schatzki ring, erosive esophagitis...dilated & PPI incr to Bid... ~  [facility] Hepatobiliary scan 3/11 showed patent ducts, GB EF=31.5% (normal>30%), pt did have symptoms during the CCK infusion. ~  [name]'s note indicated surg consultation; pt indicates that they didn't feel he needed GB removed. ~  [name]'s note from 3/13 indicated that he changed pt from Prev30Bid to Dexilant60 Qam; and taking "BiosLifeSlim" bid...  DIVERTICULOSIS OF COLON (ICD-562.10)  COLONIC POLYPS (ICD-211.3) - GI = [name] and pt reports seen 3/09 w/ colonoscopy showing 4 polyps... f/u planned 47yrs. ~  adm to [name] 5/09 w/ lower GI bleed likely from Divertics- self-limited, didn't req Tx etc...  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 ~  pt reports PSA 3/10 was <2 ~  labs here 2/11 showed PSA= 1.13 ~  Labs 5/12 showed PSA= 1.67 ~  Labs 6/13 showed PSA= 0.95  OSTEOARTHRITIS & ORTHOPEDIC ISSUES: LOW BACK PAIN,  CHRONIC (ICD-724.2) - he has a hx of LBP and FM-like symptoms... states doing well w/ exercise program. ~  8/11:  fall from bed of truck w/ left prox humerus fx & surg [name] (shoulder replacement)- lots of rehab but still has decr ROM shoulder.  ANXIETY (ICD-300.00) - takes ALPRAZOLAM 0.5mg  Prn...  Health Maintenance - up to date on colonoscopy and prostate evals... he had TETANUS shot 2003, and PNEUMOVAX at age 46 in 2003- repeat PNEUMOVAX given 2/11 at age 83... he gets yearly Flu shots as well...  Given TDAP 6/13...   Past Surgical History  Procedure Date  .  Inguinal hernia repair     left  . Rotator cuff repair 5/08    left by Dr Rennis Chris  . 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 Leg surgery     Outpatient Encounter Prescriptions as of 07/21/2011  Medication Sig Dispense Refill  . ALPRAZolam (XANAX) 0.5 MG tablet 1/2 - 1 tab by mouth three times daily as needed for nerves  90 tablet  5  . amLODipine-benazepril (LOTREL) 10-20 MG per capsule Take 1 capsule by mouth daily.        Marland Kitchen aspirin 81 MG tablet Take 81 mg by mouth daily.        . fish oil-omega-3 fatty acids 1000 MG capsule Take 1 g by mouth daily.        . lansoprazole (PREVACID) 30 MG capsule TAKE 1 CAPSULE TWICE A DAY  180 capsule  2  . meclizine (ANTIVERT) 25 MG tablet 1/2 - 1 tab by mouth up to three times daily as needed for dizziness  90 tablet  5  . simvastatin (ZOCOR) 40 MG tablet Take 20 mg by mouth at bedtime.       . Tamsulosin HCl (FLOMAX) 0.4 MG CAPS Take 0.4 mg by mouth daily after supper.       Marland Kitchen DISCONTD: meclizine (ANTIVERT) 25 MG tablet 1/2 - 1 tab by mouth up to three times daily as needed for dizziness  90 tablet  5  . DISCONTD: HYDROcodone-acetaminophen (VICODIN) 5-500 MG per tablet Take 1 tablet by mouth every 8 (eight) hours as needed for pain.  20 tablet  0  . DISCONTD: meloxicam (MOBIC) 15 MG tablet Take 1 tablet (15 mg total) by mouth daily as needed for pain.  30 tablet   2    No Known Allergies   Current Medications, Allergies, Past Medical History, Past Surgical History, Family History, and Social History were reviewed in [employer] record.   Review of Systems         See HPI - all other systems neg except as noted... The patient complains of dyspnea on exertion.  The patient denies anorexia, fever, weight loss, weight gain, vision loss, decreased hearing, hoarseness, chest pain, syncope, peripheral edema, prolonged cough, headaches, hemoptysis, abdominal pain, melena, hematochezia, severe indigestion/heartburn, hematuria, incontinence, muscle weakness, suspicious skin lesions, transient blindness, difficulty walking, depression, unusual weight change, abnormal bleeding, enlarged lymph nodes, and angioedema.   Objective:   Physical Exam      WD, WN, 71 y/o WM in NAD... GENERAL:  Alert & oriented; pleasant & cooperative... HEENT:  [place]/AT, EOM-full, PERRLA, EACs-clear, TMs-wnl, NOSE-clear, THROAT-clear & wnl. NECK:  Supple w/ fairROM; no JVD; normal carotid impulses w/o bruits; no thyromegaly or nodules palpated; no lymphadenopathy. CHEST:  Clear to P & A; without wheezes/ rales/ or rhonchi heard...  HEART:  Regular Rhythm; without murmurs/ rubs/ or gallops detected... ABDOMEN:  Soft & nontender; normal bowel sounds; no organomegaly or masses palpated... EXT:  s/p left humerus surg, mild arthritic changes; no varicose veins/ venous insuffic/ or edema. NEURO:  CN's intact; motor testing normal; sensory testing normal; gait normal & balance OK. DERM:  No lesions noted; periumbil rash improved...  RADIOLOGY DATA:  Reviewed in the EPIC EMR & discussed w/ the patient...  LABORATORY DATA:  Reviewed in the EPIC EMR & discussed w/ the patient...   Assessment & Plan:   HBP>  Controlled on Lotrel + diet/ exercise, reminded to elim salt etc..Marland Kitchen  Hx of CP>  On ASA, no further prob since 12/11 trip to ER w/ neg eval at that  time...  CHOL>  On Simva20 now + diet efforts w/ FLP ok x LDL=118; rec continue same for now 7 better diet efforts...  GI> HxPUD, GERD, Divertics, Polyps>  [facility] & his note from 3/13 is reviewed; continue current meds...  ORTHO>  DJD, LBP, etc>  He is still doing his home therapy for the left shoulder injury but has permanent deficit in shoulder motion...  ANXIETY>  Continue Alpraz prn use...   Patient's Medications  New Prescriptions   No medications on file  Previous Medications   ALPRAZOLAM (XANAX) 0.5 MG TABLET    1/2 - 1 tab by mouth three times daily as needed for nerves   AMLODIPINE-BENAZEPRIL (LOTREL) 10-20 MG PER CAPSULE    Take 1 capsule by mouth daily.     ASPIRIN 81 MG TABLET    Take 81 mg by mouth daily.     FISH OIL-OMEGA-3 FATTY ACIDS 1000 MG CAPSULE    Take 1 g by mouth daily.     LANSOPRAZOLE (PREVACID) 30 MG CAPSULE    TAKE 1 CAPSULE TWICE A DAY   SIMVASTATIN (ZOCOR) 40 MG TABLET    Take 20 mg by mouth at bedtime.    TAMSULOSIN HCL (FLOMAX) 0.4 MG CAPS    Take 0.4 mg by mouth daily after supper.   Modified Medications   Modified Medication Previous Medication   MECLIZINE (ANTIVERT) 25 MG TABLET meclizine (ANTIVERT) 25 MG tablet      1/2 - 1 tab by mouth up to three times daily as needed for dizziness    1/2 - 1 tab by mouth up to three times daily as needed for dizziness  Discontinued Medications   HYDROCODONE-ACETAMINOPHEN (VICODIN) 5-500 MG PER TABLET    Take 1 tablet by mouth every 8 (eight) hours as needed for pain.   MELOXICAM (MOBIC) 15 MG TABLET    Take 1 tablet (15 mg total) by mouth daily as needed for pain.

## 2011-08-18 ENCOUNTER — Other Ambulatory Visit: Payer: Self-pay | Admitting: Pulmonary Disease

## 2011-08-18 MED ORDER — LANSOPRAZOLE 30 MG PO CPDR: 30.0000 mg | DELAYED_RELEASE_CAPSULE | Freq: Every day | ORAL | Status: DC

## 2011-08-18 NOTE — Telephone Encounter (Signed)
Faxed refill request received for Lansoprazole 30 mg capules take 1 capsule twice daily Last ov was 07/21/11 Refill sent in to pharmacy

## 2011-10-06 ENCOUNTER — Other Ambulatory Visit: Payer: Self-pay | Admitting: Pulmonary Disease

## 2011-10-06 MED ORDER — AMLODIPINE BESY-BENAZEPRIL HCL 10-20 MG PO CAPS: 1.0000 | ORAL_CAPSULE | Freq: Every day | ORAL | Status: DC

## 2011-10-06 NOTE — Telephone Encounter (Signed)
Faxed refill request received from [facility] for Amlodipine 10-20 capsule, Take 1 capsule by mouth once daily. Patient last seen 07-21-11. Refill sent in to pharmacy.

## 2011-10-18 ENCOUNTER — Encounter: Payer: Self-pay | Admitting: Adult Health

## 2011-10-18 ENCOUNTER — Ambulatory Visit (INDEPENDENT_AMBULATORY_CARE_PROVIDER_SITE_OTHER): Payer: Medicare Other | Admitting: Adult Health

## 2011-10-18 VITALS — BP 136/80 | HR 67 | Temp 97.9°F | Ht 69.0 in | Wt 168.2 lb

## 2011-10-18 DIAGNOSIS — S46919A Strain of unspecified muscle, fascia and tendon at shoulder and upper arm level, unspecified arm, initial encounter: Secondary | ICD-10-CM

## 2011-10-18 DIAGNOSIS — Z23 Encounter for immunization: Secondary | ICD-10-CM

## 2011-10-18 DIAGNOSIS — IMO0002 Reserved for concepts with insufficient information to code with codable children: Secondary | ICD-10-CM

## 2011-10-18 MED ORDER — MELOXICAM 15 MG PO TABS
15.0000 mg | ORAL_TABLET | Freq: Every day | ORAL | Status: DC | PRN
Start: 2011-10-18 — End: 2012-01-04

## 2011-10-18 MED ORDER — METAXALONE 800 MG PO TABS: 800.0000 mg | ORAL_TABLET | Freq: Three times a day (TID) | ORAL | Status: DC | PRN

## 2011-10-18 MED ORDER — SIMVASTATIN 40 MG PO TABS: 20.0000 mg | ORAL_TABLET | Freq: Every day | ORAL | Status: DC

## 2011-10-18 NOTE — Patient Instructions (Addendum)
Mobic 15mg  daily with food As needed  For joint pain  Skelaxin 800mg  Three times a day  As needed  Muscle spasm.  Alternate ice and heat to neck and shoulder.  Please contact office for sooner follow up if symptoms do not improve or worsen or seek emergency care  Flu shot today

## 2011-10-18 NOTE — Progress Notes (Signed)
Subjective:    Patient ID: Mark Jordan, male    DOB: 04/07/1940, 71 y.o.   MRN: [id number]  HPI 71 y/o WM he has mult med probs as noted below...   ~  February 05, 2010:  he was eval in ER 01/23/10 for CP> atyp sharp left CP (s/p trauma w/ left humerus surg 9/11 by Mark Jordan) w/ neg EKG, neg ENZ, & neg StressEcho per ER protocol (he is reassured since a friend dropped dead last yr while mowing)... he notes intermittent dizziness, some drainage & congestion> we discussed benefits to Rx w/ Meclizine & Mucinex...  BP remains norm on Lotrel;  Chol remains reasonable on Simva40 + diet;  he noted some insomnia- improved on Restoril30 per Mark Jordan but he is advised to try the Alpra0.5 at bedtime...  ~  Jun 04, 2010:  4 mo ROV & he notes some weakness, low energy, etc;  He's finished his rehab for his left shoulder & now doing exercises at home, walking, some yard work, denies CP etc;  He tells me that Mark Jordan did some additional GI tests- ?Sonar, Hepatobiliary scan (normal x some symptoms w/ CCK infusion) & sent him to the surgeons but they didn't think his GB needed to be removed... Due for fasting labs today> see prob list below:  ~  December 07, 2010:  71yo ROV & he reports recent trip to [employer] for abd discomfort & diarrhea> no additional work-up, just given Cipro & Flagyl Rx + bland diet;  He reports improved on Rx & denies lower abd cramping, blood in stool etc;  He is followed by Mark Jordan for GI> his last note from 3/12 is reviewed: Chr reflux on Prev30Bid, ?GB dysfunction, ?had surg consult at that time, f/u colonoscopy due 04/2012...    BP controlled on his Lotrel w/ BP= 128/74 today & similar at home;  He denies recurrent CP, palpit, dizzy, syncope, SOB, or edema;  Chol managed w/ Simva40 & due for f/u FLP (may need stronger statin Rx);  He saw Mark Jordan 2wks ago for Urology f/u & he reports doing well, no changes made;  Requesting refill of his Alprazolam today and he refuses the 2012 Flu  vaccine...  ~  July 21, 2011:  71yo ROV & Mark Jordan has had a good interval w/o new complaints or concerns; he tells me he is not taking Mobic or Vicodin 7 just uses Tylenol for discomfort- we updated his EPIC med list accordingly... We gave him the TDAP vaccination today... HBP> on Lotrel10-20; BP= 142/90 & we reviewed the need for low sodium diet & home BP monitoring... HxAtypCP> on ASA81mg /d; he saw Mark Jordan 1/13 & was felt to be stable, no changes made... Chol> on Simva40- taking 1/2 daily; FLP showed TChol 170, TG 43, HDL 43, LDL 118; decided on same med, better diet... GI> GERD, HxPUD, Divertics, Polyps> followed by Mark Jordan & he remains on Prev30Bid; pt didn't mention Dexilant or BiosLifeSlim today... GU> Hx KidStone, BPH> followed by Mark Jordan on Flomax0.4; PSA is wnl at 0.95... Ortho> DJD, LBP, left shoulder injury> followed by Mark Jordan but not on any prescription meds- just Tylenol etc OTC...  Anxiety> on Xanax0.5mg  prn...    We reviewed prob list, meds, xrays and labs> see below>> LABS 6/13:  FLP- ok x LDL=118 on Simva20;  Chems- wnl;  CBC- wnl;  TSH=1.86;  PSA=0.95  10/18/2011 Acute OV  Complains of tingling/pain in left shoulder and numbness across upper back 3 days.  Sore to lift up shoulder on  left . No known injury.  Pain w/ raising left arm  Neck is sore and tight .  No otc used.  No fever , chest pain , abdominal pain .           Problem List:  ALLERGIC RHINITIS (ICD-477.9) - uses OTC meds Prn...  HYPERTENSION (ICD-401.9) - on LOTREL 10-20/d w/ good control of BP...  CHEST PAIN, ATYPICAL (ICD-786.59) - hx CP and tachy palpit in 2003 w/ neg cardiac eval from Mark Jordan... on ASA 81mg /d, and asymptomatic currently. ~  NuclearStressTest 6/03 showed no infarct, ? mild ischemia inferiorly, EF= 57%... ~  Mark Jordan 6/03 was negative without stress induced wall motion abn... ~  12/11:  CP eval via ER w/ neg EKG, neg ENZ, & norm StressEcho... ~  1/13:  He had f/u Mark Jordan>  doing satis w/o angina, occas atypCP; no changes made.Marland Kitchen  HYPERCHOLESTEROLEMIA (ICD-272.0) - on SIMVASTATIN 20mg /d now (40mg - 1/2 tab) + FISH OIL 1000mg /d... ~  FLP 3/09 showed TChol 157, TG 33, HDL 38, LDL 113... Imroved, continue same med + diet. ~  FLP 3/10 showed TChol 145, TG 32, HDL 40, LDL 99 ~  FLP 2/11 showed TChol 199, TG 34, HDL 54, LDL 139... rec> same med, better diet, get wt down. ~  FLP 11/11 showed TChol 168, TG 50, HDL 41, LDL 117 ~  FLP 5/12 on Simva40+FishOil showed TChol 181, TG 30, HDL 41, LDL 134... Discussed better diet. ~  FLP 11/12 on Simva40+FishOil showed TChol 79, TG 15, HDL 42, LDL 34... Hard to believe numbers on same med, and SGOT/ SGPT are elev also;  REC to HOLD Simva40 for 1 month & repeat FLP/ Liver panel at that time... ~  Repeat FLP 12/12 off Simva for 30d showed TChol 195, TG 35, HDL 44, LDL 144, and LFTs were back to normal; rec start Simva20. ~  FLP 6/13 on Simva20 showed TChol 170, TG 43, HDL 43, LDL 118... Keep same + better diet.  GERD (ICD-530.81)  PUD, HX OF (ICD-V12.71) - on PREVACID 30mg Bid now ... followed by [facility] for GI. ~  EGD 2004 showed large HH & schatzki ring... ~  AbdSonar 10/04 was WNL... ~  EDG 1/10 repeated for dysphagia w/ HH, schatzki ring, erosive esophagitis...dilated & PPI incr to Bid... ~  [facility] Hepatobiliary scan 3/11 showed patent ducts, GB EF=31.5% (normal>30%), pt did have symptoms during the CCK infusion. ~  Mark Jordan's note indicated surg consultation; pt indicates that they didn't feel he needed GB removed. ~  Mark Jordan's note from 3/13 indicated that he changed pt from Prev30Bid to Dexilant60 Qam; and taking "BiosLifeSlim" bid...  DIVERTICULOSIS OF COLON (ICD-562.10)  COLONIC POLYPS (ICD-211.3) - GI = Mark Jordan and pt reports seen 3/09 w/ colonoscopy showing 4 polyps... f/u planned 32yrs. ~  adm to [name] 5/09 w/ lower GI bleed likely from Divertics- self-limited, didn't req Tx etc...  Hx of CALCULUS, KIDNEY  (ICD-592.0) - followed by [facility] & he  also does his PSA's. ~  pt reports PSA 3/10 was <2 ~  labs here 2/11 showed PSA= 1.13 ~  Labs 5/12 showed PSA= 1.67 ~  Labs 6/13 showed PSA= 0.95  OSTEOARTHRITIS & ORTHOPEDIC ISSUES: LOW BACK PAIN, CHRONIC (ICD-724.2) - he has a hx of LBP and FM-like symptoms... states doing well w/ exercise program. ~  8/11:  fall from bed of truck w/ left prox humerus fx & surg Mark Jordan (shoulder replacement)- lots of rehab but still has decr ROM shoulder.  ANXIETY (ICD-300.00) - takes  ALPRAZOLAM 0.5mg  Prn...  Health Maintenance - up to date on colonoscopy and prostate evals... he had TETANUS shot 2003, and PNEUMOVAX at age 41 in 2003- repeat PNEUMOVAX given 2/11 at age 66... he gets yearly Flu shots as well...  Given TDAP 6/13...   Past Surgical History  Procedure Date  . Inguinal hernia repair     left  . Rotator cuff repair 5/08    left by Dr Rennis Chris  . 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 Leg surgery     Outpatient Encounter Prescriptions as of 10/18/2011  Medication Sig Dispense Refill  . ALPRAZolam (XANAX) 0.5 MG tablet 1/2 - 1 tab by mouth three times daily as needed for nerves  90 tablet  5  . amLODipine-benazepril (LOTREL) 10-20 MG per capsule Take 1 capsule by mouth daily.  30 capsule  2  . aspirin 81 MG tablet Take 81 mg by mouth daily.        . lansoprazole (PREVACID) 30 MG capsule Take 1 capsule (30 mg total) by mouth daily.  180 capsule  2  . meclizine (ANTIVERT) 25 MG tablet 1/2 - 1 tab by mouth up to three times daily as needed for dizziness  90 tablet  5  . simvastatin (ZOCOR) 40 MG tablet Take 20 mg by mouth at bedtime.       . Tamsulosin HCl (FLOMAX) 0.4 MG CAPS Take 0.4 mg by mouth daily after supper.       Marland Kitchen DISCONTD: fish oil-omega-3 fatty acids 1000 MG capsule Take 1 g by mouth daily.          No Known Allergies   Current Medications, Allergies, Past Medical History, Past Surgical History, Family  History, and Social History were reviewed in [employer] record.   Review of Systems        Constitutional:   No  weight loss, night sweats,  Fevers, chills, + fatigue, or  lassitude.  HEENT:   No headaches,  Difficulty swallowing,  Tooth/dental problems, or  Sore throat,                No sneezing, itching, ear ache, nasal congestion, post nasal drip,   CV:  No chest pain,  Orthopnea, PND, swelling in lower extremities, anasarca, dizziness, palpitations, syncope.   GI  No heartburn, indigestion, abdominal pain, nausea, vomiting, diarrhea, change in bowel habits, loss of appetite, bloody stools.   Resp: No shortness of breath with exertion or at rest.  No excess mucus, no productive cough,  No non-productive cough,  No coughing up of blood.  No change in color of mucus.  No wheezing.  No chest wall deformity  Skin: no rash or lesions.  GU: no dysuria, change in color of urine, no urgency or frequency.  No flank pain, no hematuria   MS: + joint pain     Psych:  No change in mood or affect. No depression or anxiety.  No memory loss.       Objective:   Physical Exam      WD, WN, 71 y/o WM in NAD... GENERAL:  Alert & oriented; pleasant & cooperative... HEENT:  [place]/AT, EACs-clear, TMs-wnl, NOSE-clear, THROAT-clear & wnl. NECK:  Supple w/ fairROM; no JVD; normal carotid impulses w/o bruits; no thyromegaly or nodules palpated; no lymphadenopathy. CHEST:  Clear to P & A; without wheezes/ rales/ or rhonchi heard...  HEART:  Regular Rhythm; without murmurs/ rubs/ or gallops detected.Marland KitchenMarland Kitchen  ABDOMEN:  Soft & nontender; normal bowel sounds; no organomegaly or masses palpated... EXT:  s/p left humerus surg, mild arthritic changes; no varicose veins/ venous insuffic/ or edema. Tender along left subscapular area, nml ROM , nml grips. Neck rom nml , no radicular symptoms  NEURO:  CN's intact; motor testing normal; sensory testing normal; gait normal & balance OK. DERM:  No  lesions noted; periumbil rash improved...   Assessment & Plan:

## 2011-10-19 ENCOUNTER — Telehealth: Payer: Self-pay | Admitting: Adult Health

## 2011-10-19 MED ORDER — CYCLOBENZAPRINE HCL 5 MG PO TABS: 5.0000 mg | ORAL_TABLET | Freq: Three times a day (TID) | ORAL | Status: DC | PRN

## 2011-10-19 NOTE — Telephone Encounter (Signed)
Last OV 10-18-11. Pt states skelaxin is going to cost him $100. He would like an alternative if possible. Please advise. Mark Jordan, CMA

## 2011-10-19 NOTE — Telephone Encounter (Signed)
I spoke with pt and is aware of RX change. I have called new rx into the pharmacy. Nothing further was needed

## 2011-10-19 NOTE — Telephone Encounter (Signed)
Try Flexeril 5mg  1 By mouth Three times a day  As needed  Muscle spasm, may cause drowsiness  #30 , 1 refill  Please contact office for sooner follow up if symptoms do not improve or worsen or seek emergency care

## 2011-10-21 NOTE — Assessment & Plan Note (Signed)
Shoulder strain   Plan  Mobic 15mg  daily with food As needed  For joint pain  Skelaxin 800mg  Three times a day  As needed  Muscle spasm.  Alternate ice and heat to neck and shoulder.  Please contact office for sooner follow up if symptoms do not improve or worsen or seek emergency care  Flu shot today

## 2011-11-09 ENCOUNTER — Telehealth: Payer: Self-pay | Admitting: Pulmonary Disease

## 2011-11-09 MED ORDER — LANSOPRAZOLE 30 MG PO CPDR: 30.0000 mg | DELAYED_RELEASE_CAPSULE | Freq: Every day | ORAL | Status: DC

## 2011-11-09 NOTE — Telephone Encounter (Signed)
Rx was sent to pharm  LMOM for the pt to be made aware 

## 2011-11-11 ENCOUNTER — Encounter: Payer: Self-pay | Admitting: Cardiology

## 2011-11-11 ENCOUNTER — Ambulatory Visit (INDEPENDENT_AMBULATORY_CARE_PROVIDER_SITE_OTHER): Payer: Medicare Other | Admitting: Cardiology

## 2011-11-11 VITALS — BP 145/87 | HR 62 | Wt 165.0 lb

## 2011-11-11 DIAGNOSIS — I10 Essential (primary) hypertension: Secondary | ICD-10-CM

## 2011-11-11 DIAGNOSIS — R0789 Other chest pain: Secondary | ICD-10-CM

## 2011-11-11 DIAGNOSIS — R079 Chest pain, unspecified: Secondary | ICD-10-CM

## 2011-11-11 DIAGNOSIS — E7800 Pure hypercholesterolemia, unspecified: Secondary | ICD-10-CM

## 2011-11-11 NOTE — Assessment & Plan Note (Signed)
Managed by primary care. 

## 2011-11-11 NOTE — Assessment & Plan Note (Signed)
Continue present blood pressure medications. Management per primary care.

## 2011-11-11 NOTE — Progress Notes (Signed)
HPI: Pleasant male for fu of chest pain. Stress echocardiogram in December of 2011 was normal. Patient states he has had intermittent chest pain since the 1970s. When I saw him in January of 2013 we thought his symptoms were extremely atypical but we offered a treadmill. He declined. Since I last saw him he is complaining of left shoulder, neck and right shoulder pain. He has had intermittent left shoulder pain since surgery approximately 2 years ago. It is a burning pain. He also has pain in his neck and right shoulder that increases with movements. He has not had any exertional chest pain. He also denies dyspnea.   Current Outpatient Prescriptions  Medication Sig Dispense Refill  . ALPRAZolam (XANAX) 0.5 MG tablet 1/2 - 1 tab by mouth three times daily as needed for nerves  90 tablet  5  . amLODipine-benazepril (LOTREL) 10-20 MG per capsule Take 1 capsule by mouth daily.  30 capsule  2  . aspirin 81 MG tablet Take 81 mg by mouth daily.        . cyclobenzaprine (FLEXERIL) 5 MG tablet Take 1 tablet (5 mg total) by mouth 3 (three) times daily as needed for muscle spasms.  30 tablet  1  . lansoprazole (PREVACID) 30 MG capsule Take 1 capsule (30 mg total) by mouth daily.  90 capsule  2  . meclizine (ANTIVERT) 25 MG tablet 1/2 - 1 tab by mouth up to three times daily as needed for dizziness  90 tablet  5  . meloxicam (MOBIC) 15 MG tablet Take 1 tablet (15 mg total) by mouth daily as needed for pain.  30 tablet  3  . simvastatin (ZOCOR) 40 MG tablet Take 0.5 tablets (20 mg total) by mouth at bedtime.  30 tablet  11  . Tamsulosin HCl (FLOMAX) 0.4 MG CAPS Take 0.4 mg by mouth daily after supper.          Past Medical History  Diagnosis Date  . Allergic rhinitis   . HTN (hypertension)   . Hypercholesterolemia   . GERD (gastroesophageal reflux disease)   . PUD (peptic ulcer disease)   . Diverticulosis of colon   . Kidney calculus   . Chronic low back pain   . Anxiety     Past Surgical  History  Procedure Date  . Inguinal hernia repair     left  . Rotator cuff repair 5/08    left by Dr Rennis Chris  . 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 Leg surgery     History   Social History  . Marital Status: Married    Spouse Name: Brett Darko    Number of Children: 3  . Years of Education: N/A   Occupational History  . OWNER    Social History Main Topics  . Smoking status: Never Smoker   . Smokeless tobacco: Never Used  . Alcohol Use: No  . Drug Use: No  . Sexually Active: Not on file   Other Topics Concern  . Not on file   Social History Narrative   Does not exercise1 cup coffee a day4 siblings all in good health    ROS: no fevers or chills, productive cough, hemoptysis, dysphasia, odynophagia, melena, hematochezia, dysuria, hematuria, rash, seizure activity, orthopnea, PND, pedal edema, claudication. Remaining systems are negative.  Physical Exam: Well-developed well-nourished in no acute distress.  Skin is warm and dry.  HEENT is normal.  Neck is supple.  Chest  is clear to auscultation with normal expansion. Some tenderness to palpation over neck and shoulder. Cardiovascular exam is regular rate and rhythm.  Abdominal exam nontender or distended. No masses palpated. Extremities show no edema. neuro grossly intact  ECG sinus bradycardia at a rate of 56. No ST changes.

## 2011-11-11 NOTE — Patient Instructions (Addendum)
Your physician recommends that you schedule a follow-up appointment in: AS NEEDED  

## 2011-11-11 NOTE — Assessment & Plan Note (Signed)
Symptoms are atypical and most consistent with musculoskeletal pain. EKG normal. Stress echocardiogram previously normal. I do not think further ischemia evaluation is indicated.

## 2012-01-04 ENCOUNTER — Ambulatory Visit (INDEPENDENT_AMBULATORY_CARE_PROVIDER_SITE_OTHER): Payer: Medicare Other | Admitting: Adult Health

## 2012-01-04 ENCOUNTER — Encounter: Payer: Self-pay | Admitting: Adult Health

## 2012-01-04 VITALS — BP 126/74 | HR 67 | Temp 98.1°F | Ht 69.0 in | Wt 168.2 lb

## 2012-01-04 DIAGNOSIS — M545 Low back pain, unspecified: Secondary | ICD-10-CM

## 2012-01-04 MED ORDER — MELOXICAM 15 MG PO TABS: 15.0000 mg | ORAL_TABLET | Freq: Every day | ORAL | Status: DC | PRN

## 2012-01-04 MED ORDER — METAXALONE 800 MG PO TABS: 800.0000 mg | ORAL_TABLET | Freq: Three times a day (TID) | ORAL | Status: DC

## 2012-01-04 NOTE — Assessment & Plan Note (Signed)
Flare   Plan Mobic 15mg  daily with food As needed  For joint pain  Skelaxin 800mg  Three times a day  As needed  Muscle spasm.  Alternate ice and heat to back.   Please contact office for sooner follow up if symptoms do not improve or worsen or seek emergency care

## 2012-01-04 NOTE — Addendum Note (Signed)
Addended by: Boone Master E on: 01/04/2012 05:38 PM   Modules accepted: Orders

## 2012-01-04 NOTE — Patient Instructions (Addendum)
Mobic 15mg  daily with food As needed  For joint pain  Skelaxin 800mg  Three times a day  As needed  Muscle spasm.  Alternate ice and heat to back.   Please contact office for sooner follow up if symptoms do not improve or worsen or seek emergency care

## 2012-01-04 NOTE — Progress Notes (Signed)
Subjective:    Patient ID: Mark Jordan, male    DOB: Jan 13, 1941, 71 y.o.   MRN: [id number]  HPI 71 y/o WM he has mult med probs as noted below...     01/04/2012 Acute OV  Complains of low right-sided back discomfort, intermittent x1 month - denies urinary symptoms, rash/blisters, trauma. Pain with bending , turning and raising arms. No chest pain, urinary symptoms or n/v/d.  Mild tenderness along right lower back and side. No hip pain . No radicular pain or numbness.  No known injury .  No meds used.          Problem List:  ALLERGIC RHINITIS (ICD-477.9) - uses OTC meds Prn...  HYPERTENSION (ICD-401.9) - on LOTREL 10-20/d w/ good control of BP...  CHEST PAIN, ATYPICAL (ICD-786.59) - hx CP and tachy palpit in 2003 w/ neg cardiac eval from [name]... on ASA 81mg /d, and asymptomatic currently. ~  NuclearStressTest 6/03 showed no infarct, ? mild ischemia inferiorly, EF= 57%... ~  Mark Jordan 6/03 was negative without stress induced wall motion abn... ~  12/11:  CP eval via ER w/ neg EKG, neg ENZ, & norm StressEcho... ~  1/13:  He had f/u [name]> doing satis w/o angina, occas atypCP; no changes made.Marland Kitchen  HYPERCHOLESTEROLEMIA (ICD-272.0) - on SIMVASTATIN 20mg /d now (40mg - 1/2 tab) + FISH OIL 1000mg /d... ~  FLP 3/09 showed TChol 157, TG 33, HDL 38, LDL 113... Imroved, continue same med + diet. ~  FLP 3/10 showed TChol 145, TG 32, HDL 40, LDL 99 ~  FLP 2/11 showed TChol 199, TG 34, HDL 54, LDL 139... rec> same med, better diet, get wt down. ~  FLP 11/11 showed TChol 168, TG 50, HDL 41, LDL 117 ~  FLP 5/12 on Simva40+FishOil showed TChol 181, TG 30, HDL 41, LDL 134... Discussed better diet. ~  FLP 11/12 on Simva40+FishOil showed TChol 79, TG 15, HDL 42, LDL 34... Hard to believe numbers on same med, and SGOT/ SGPT are elev also;  REC to HOLD Simva40 for 1 month & repeat FLP/ Liver panel at that time... ~  Repeat FLP 12/12 off Simva for 30d showed TChol 195, TG 35, HDL 44, LDL 144, and  LFTs were back to normal; rec start Simva20. ~  FLP 6/13 on Simva20 showed TChol 170, TG 43, HDL 43, LDL 118... Keep same + better diet.  GERD (ICD-530.81)  PUD, HX OF (ICD-V12.71) - on PREVACID 30mg Bid now ... followed by [facility] for GI. ~  EGD 2004 showed large HH & schatzki ring... ~  [facility] 10/04 was WNL... ~  EDG 1/10 repeated for dysphagia w/ HH, schatzki ring, erosive esophagitis...dilated & PPI incr to Bid... ~  [facility] Hepatobiliary scan 3/11 showed patent ducts, GB EF=31.5% (normal>30%), pt did have symptoms during the CCK infusion. ~  [name]'s note indicated surg consultation; pt indicates that they didn't feel he needed GB removed. ~  [name]'s note from 3/13 indicated that he changed pt from Prev30Bid to Dexilant60 Qam; and taking "BiosLifeSlim" bid...  DIVERTICULOSIS OF COLON (ICD-562.10)  COLONIC POLYPS (ICD-211.3) - GI = [name] and pt reports seen 3/09 w/ colonoscopy showing 4 polyps... f/u planned 56yrs. ~  adm to [name] 5/[facility] w/ lower GI bleed likely from Divertics- self-limited, didn't req Tx etc...  Hx of CALCULUS, KIDNEY (ICD-592.0) - followed by [id number] & he  also does his PSA's. ~  pt reports PSA 3/10 was <2 ~  labs here 2/11 showed PSA= 1.13 ~  Labs 5/12 showed PSA= 1.67 ~  Labs 6/13 showed PSA= 0.95  OSTEOARTHRITIS & ORTHOPEDIC ISSUES: LOW BACK PAIN, CHRONIC (ICD-724.2) - he has a hx of LBP and FM-like symptoms... states doing well w/ exercise program. ~  8/11:  fall from bed of truck w/ left prox humerus fx & surg [name] (shoulder replacement)- lots of rehab but still has decr ROM shoulder.  ANXIETY (ICD-300.00) - takes ALPRAZOLAM 0.5mg  Prn...  Health Maintenance - up to date on colonoscopy and prostate evals... he had TETANUS shot 2003, and PNEUMOVAX at age 56 in 2003- repeat PNEUMOVAX given 2/11 at age 62... he gets yearly Flu shots as well...  Given TDAP 6/13...   Past Surgical History  Procedure Date  . Inguinal hernia repair     left   . Rotator cuff repair 5/08    left by Dr Rennis Chris  . 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 Leg surgery     Outpatient Encounter Prescriptions as of 01/04/2012  Medication Sig Dispense Refill  . ALPRAZolam (XANAX) 0.5 MG tablet 1/2 - 1 tab by mouth three times daily as needed for nerves  90 tablet  5  . amLODipine-benazepril (LOTREL) 10-20 MG per capsule Take 1 capsule by mouth daily.  30 capsule  2  . aspirin 81 MG tablet Take 81 mg by mouth daily.        . cyclobenzaprine (FLEXERIL) 5 MG tablet Take 1 tablet (5 mg total) by mouth 3 (three) times daily as needed for muscle spasms.  30 tablet  1  . lansoprazole (PREVACID) 30 MG capsule Take 1 capsule (30 mg total) by mouth daily.  90 capsule  2  . meclizine (ANTIVERT) 25 MG tablet 1/2 - 1 tab by mouth up to three times daily as needed for dizziness  90 tablet  5  . meloxicam (MOBIC) 15 MG tablet Take 1 tablet (15 mg total) by mouth daily as needed for pain.  30 tablet  3  . simvastatin (ZOCOR) 40 MG tablet Take 0.5 tablets (20 mg total) by mouth at bedtime.  30 tablet  11  . Tamsulosin HCl (FLOMAX) 0.4 MG CAPS Take 0.4 mg by mouth daily after supper.         No Known Allergies   Current Medications, Allergies, Past Medical History, Past Surgical History, Family History, and Social History were reviewed in [employer] record.   Review of Systems        Constitutional:   No  weight loss, night sweats,  Fevers, chills, + fatigue, or  lassitude.  HEENT:   No headaches,  Difficulty swallowing,  Tooth/dental problems, or  Sore throat,                No sneezing, itching, ear ache, nasal congestion, post nasal drip,   CV:  No chest pain,  Orthopnea, PND, swelling in lower extremities, anasarca, dizziness, palpitations, syncope.   GI  No heartburn, indigestion, abdominal pain, nausea, vomiting, diarrhea, change in bowel habits, loss of appetite, bloody stools.   Resp: No shortness of  breath with exertion or at rest.  No excess mucus, no productive cough,  No non-productive cough,  No coughing up of blood.  No change in color of mucus.  No wheezing.  No chest wall deformity  Skin: no rash or lesions.  GU: no dysuria, change in color of urine, no urgency or frequency.  No flank pain, no hematuria   MS: + joint pain  Psych:  No change in mood or affect. No depression or anxiety.  No memory loss.       Objective:   Physical Exam      WD, WN, 71 y/o WM in NAD... GENERAL:  Alert & oriented; pleasant & cooperative... HEENT:  [place]/AT, EACs-clear, TMs-wnl, NOSE-clear, THROAT-clear & wnl. NECK:  Supple w/ fairROM; no JVD; normal carotid impulses w/o bruits; no thyromegaly or nodules palpated; no lymphadenopathy. CHEST:  Clear to P & A; without wheezes/ rales/ or rhonchi heard...  HEART:  Regular Rhythm; without murmurs/ rubs/ or gallops detected... ABDOMEN:  Soft & nontender; normal bowel sounds; no organomegaly or masses palpated... EXT:  s/p left humerus surg, mild arthritic changes; no varicose veins/ venous insuffic/ or edema. Tender along right lower lumbar /thoracic ,  no radicular symptoms , no rash, neg SLR.  NEURO:  CN's intact; motor testing normal; sensory testing normal; gait normal & balance OK. DERM:  No lesions noted   Assessment & Plan:

## 2012-02-02 HISTORY — PX: COLONOSCOPY: SHX174

## 2012-02-21 ENCOUNTER — Ambulatory Visit (INDEPENDENT_AMBULATORY_CARE_PROVIDER_SITE_OTHER)
Admission: RE | Admit: 2012-02-21 | Discharge: 2012-02-21 | Disposition: A | Discharge status: Home / Self Care | Payer: Medicare Other | Source: Ambulatory Visit | Attending: Pulmonary Disease | Admitting: Pulmonary Disease

## 2012-02-21 ENCOUNTER — Encounter: Payer: Self-pay | Admitting: Pulmonary Disease

## 2012-02-21 ENCOUNTER — Ambulatory Visit (INDEPENDENT_AMBULATORY_CARE_PROVIDER_SITE_OTHER): Payer: Medicare Other | Admitting: Pulmonary Disease

## 2012-02-21 ENCOUNTER — Other Ambulatory Visit (INDEPENDENT_AMBULATORY_CARE_PROVIDER_SITE_OTHER): Payer: Medicare Other

## 2012-02-21 VITALS — BP 142/78 | HR 60 | Temp 97.8°F | Ht 69.0 in | Wt 167.4 lb

## 2012-02-21 DIAGNOSIS — D126 Benign neoplasm of colon, unspecified: Secondary | ICD-10-CM

## 2012-02-21 DIAGNOSIS — I10 Essential (primary) hypertension: Secondary | ICD-10-CM

## 2012-02-21 DIAGNOSIS — N401 Enlarged prostate with lower urinary tract symptoms: Secondary | ICD-10-CM | POA: Insufficient documentation

## 2012-02-21 DIAGNOSIS — K219 Gastro-esophageal reflux disease without esophagitis: Secondary | ICD-10-CM

## 2012-02-21 DIAGNOSIS — M545 Low back pain: Secondary | ICD-10-CM

## 2012-02-21 DIAGNOSIS — M1990 Unspecified osteoarthritis, unspecified site: Secondary | ICD-10-CM | POA: Insufficient documentation

## 2012-02-21 DIAGNOSIS — M542 Cervicalgia: Secondary | ICD-10-CM

## 2012-02-21 DIAGNOSIS — K5730 Diverticulosis of large intestine without perforation or abscess without bleeding: Secondary | ICD-10-CM

## 2012-02-21 DIAGNOSIS — F411 Generalized anxiety disorder: Secondary | ICD-10-CM

## 2012-02-21 DIAGNOSIS — N138 Other obstructive and reflux uropathy: Secondary | ICD-10-CM | POA: Insufficient documentation

## 2012-02-21 DIAGNOSIS — N139 Obstructive and reflux uropathy, unspecified: Secondary | ICD-10-CM

## 2012-02-21 DIAGNOSIS — R0789 Other chest pain: Secondary | ICD-10-CM

## 2012-02-21 DIAGNOSIS — E7800 Pure hypercholesterolemia, unspecified: Secondary | ICD-10-CM

## 2012-02-21 DIAGNOSIS — N200 Calculus of kidney: Secondary | ICD-10-CM

## 2012-02-21 LAB — LIPID PANEL
HDL: 39.1 mg/dL (ref >39.00)
LDL Cholesterol: 130 mg/dL — ABNORMAL HIGH (ref 0–99)
VLDL: 11.2 mg/dL (ref 0.0–40.0)

## 2012-02-21 LAB — BASIC METABOLIC PANEL
BUN: 22 mg/dL (ref 6–23)
Calcium: 8.9 mg/dL (ref 8.4–10.5)
GFR: 111.76 mL/min (ref >60.00)
Glucose, Bld: 104 mg/dL — ABNORMAL HIGH (ref 70–99)
Potassium: 4 mEq/L (ref 3.5–5.1)
Sodium: 140 mEq/L (ref 135–145)

## 2012-02-21 LAB — HEPATIC FUNCTION PANEL
AST: 20 U/L (ref 0–37)
Albumin: 4.2 g/dL (ref 3.5–5.2)
Total Bilirubin: 0.7 mg/dL (ref 0.3–1.2)

## 2012-02-21 MED ORDER — ALPRAZOLAM 0.5 MG PO TABS: ORAL_TABLET | ORAL | Status: DC

## 2012-02-21 MED ORDER — CYCLOBENZAPRINE HCL 5 MG PO TABS: 5.0000 mg | ORAL_TABLET | Freq: Three times a day (TID) | ORAL | Status: DC | PRN

## 2012-02-21 NOTE — Patient Instructions (Addendum)
Today we updated your med list in our EPIC system...    Continue your current medications the same...    We refilled your Alprazolam per request...  For your neck pain>     We will XRay your neck & call you w/ the results...    Apply HEAT to the area 7 consider Myoflex cream etc...    Use your Meloxicam daily & you may also take some Tylenol as needed...    Continue the muscle relaxer Skelaxin three times daily...  Today we also did your follow up FASTING blood work...   We will contact you w/ these reults as well..  Call for any questions...  Let's plan a follow up visit in 6 months.Marland KitchenMarland Kitchen

## 2012-02-21 NOTE — Progress Notes (Signed)
Subjective:    Patient ID: Mark Jordan, male    DOB: 1940-10-24, 72 y.o.   MRN: [id number]  HPI 72 y/o WM here for a follow up visit... he has mult med probs as noted below...   ~  December 07, 2010:  29mo ROV & he reports recent trip to [employer] for abd discomfort & diarrhea> no additional work-up, just given Cipro & Flagyl Rx + bland diet;  He reports improved on Rx & denies lower abd cramping, blood in stool etc;  He is followed by [facility] for GI> his last note from 3/12 is reviewed: Chr reflux on Prev30Bid, ?GB dysfunction, ?had surg consult at that time, f/u colonoscopy due 04/2012...    BP controlled on his Lotrel w/ BP= 128/74 today & similar at home;  He denies recurrent CP, palpit, dizzy, syncope, SOB, or edema;  Chol managed w/ Simva40 & due for f/u FLP (may need stronger statin Rx);  He saw [name] 2wks ago for Urology f/u & he reports doing well, no changes made;  Requesting refill of his Alprazolam today and he refuses the 2012 Flu vaccine...  ~  July 21, 2011:  62mo ROV & Mark Jordan has had a good interval w/o new complaints or concerns; he tells me he is not taking Mobic or Vicodin 7 just uses Tylenol for discomfort- we updated his EPIC med list accordingly... We gave him the TDAP vaccination today... HBP> on Lotrel10-20; BP= 142/90 & we reviewed the need for low sodium diet & home BP monitoring... 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 Chol> on Simva40- taking 1/2 daily; FLP showed TChol 170, TG 43, HDL 43, LDL 118; decided on same med, better diet... GI> GERD, HxPUD, Divertics, Polyps> followed by [facility] & he remains on Prev30Bid; pt didn't mention Dexilant or BiosLifeSlim today... GU> Hx KidStone, BPH> followed by [facility] on Flomax0.4; PSA is wnl at 0.95... Ortho> DJD, LBP, left shoulder injury> followed by [name] but not on any prescription meds- just Tylenol etc OTC...  Anxiety> on Xanax0.5mg  prn...    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 We reviewed the following medical problems during today's office visit >>     HBP> on Lotrel10-20; BP= 142/78 & he denies CP, palpit, SOB, edema; we reviewed the need for low sodium diet & home BP monitoring...    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    Chol> on Simva40- taking 1/2 daily; FLP showed TChol 180, TG 56, HDL 39, LDL 130; decided to incr Simva40/d & better diet...    GI> GERD, HxPUD, Divertics, Polyps> followed by [facility] & he remains on Prev30Bid; pt didn't mention Dexilant or BiosLifeSlim today...    GU> Hx KidStone, BPH> followed by [facility] on Flomax0.4; PSA is wnl at 0.95...    Ortho> DJD, LBP, left shoulder injury> followed by [name] but not on any prescription meds-  just Tylenol etc OTC...     Anxiety> on Xanax0.5mg  prn... 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 LABS 1/14:  FLP- not at goals on Simva20 w/ LDL=130 (Rec incr 40mg /d);  Chems- wnl...          Problem List:  ALLERGIC RHINITIS (ICD-477.9) - uses OTC meds Prn...  HYPERTENSION (ICD-401.9) - on LOTREL 10-20/d w/ good control of BP... ~  6/13:  on Lotrel10-20; BP= 142/90 & we reviewed the need for low sodium diet & home BP monitoring. ~  1/14:  on Lotrel10-20; BP= 142/78 & he denies CP, palpit, SOB, edema; we reviewed the need for low sodium diet & home  BP monitoring.  CHEST PAIN, ATYPICAL (ICD-786.59) - hx CP and tachy palpit in 2003 w/ neg cardiac eval from [name]... on ASA 81mg /d, and asymptomatic currently. ~  NuclearStressTest 6/03 showed no infarct, ? mild ischemia inferiorly, EF= 57%... ~  [name] 6/03 was negative without stress induced wall motion abn... ~  12/11:  CP eval via ER w/ neg EKG, neg ENZ, & norm StressEcho; CXR showed norm heart size, clear lungs, NAD... ~  1/13:  He had f/u [name]> doing satis w/o angina, occas atypCP; no changes made.. ~  10/13: he had f/u [name]> long hx atypCP, neg stress echo 12/11, c/o shoulder & arm pain=> felt to be musculoskeletal... ~  EKG 10/13 showed SBrady, rate56, otherw wnl...  HYPERCHOLESTEROLEMIA (ICD-272.0) - on SIMVASTATIN + FISH OIL 1000mg /d... ~  FLP 3/09 showed TChol 157, TG 33, HDL 38, LDL 113... Imroved, continue same med + diet. ~  FLP 3/10 showed TChol 145, TG 32, HDL 40, LDL 99 ~  FLP 2/11 showed TChol 199, TG 34, HDL 54, LDL 139... rec> same med, better diet, get wt down. ~  FLP 11/11 showed TChol 168, TG 50, HDL 41, LDL 117 ~  FLP 5/12 on Simva40+FishOil showed TChol 181, TG 30, HDL 41, LDL 134... Discussed better diet. ~  FLP 11/12 on Simva40+FishOil showed TChol 79, TG 15, HDL 42, LDL 34... Hard to believe numbers on same med, and SGOT/ SGPT are elev also;  REC to HOLD Simva40 for 1 month & repeat FLP/ Liver panel at that time... ~  Repeat FLP 12/12 off Simva for 30d showed TChol 195, TG 35, HDL 44, LDL 144, and LFTs were back to normal; rec start Simva20. ~  FLP 6/13 on Simva20 showed TChol 170, TG 43, HDL 43, LDL 118... Keep same + better diet. ~  FLP 1/14 on Simva20 showed TChol 180, TG 56, HDL 39, LDL 130... We decided to incr Simva40  GERD (ICD-530.81)  PUD, HX OF (ICD-V12.71) - on PREVACID 30mg Bid now ... followed by [facility] for GI. ~  EGD 2004 showed large HH & schatzki ring... ~  AbdSonar 10/04 was WNL... ~  EDG 1/10 repeated for dysphagia w/ HH,  schatzki ring, erosive esophagitis...dilated & PPI incr to Bid... ~  [facility] Hepatobiliary scan 3/11 showed patent ducts, GB EF=31.5% (normal>30%), pt did have symptoms during the CCK infusion. ~  [name]'s note indicated surg consultation; pt indicates that they didn't feel he needed GB removed. ~  AbdSonar 11/12 showed normal liver, spleen, no ascites; norm GB x 3mm adherent ?stone vs polyp within, right renal cyst ~  [name]'s note from 3/13 indicated that he changed pt from Prev30Bid to Dexilant60 Qam; and  taking "BiosLifeSlim" bid...  DIVERTICULOSIS OF COLON (ICD-562.10)  COLONIC POLYPS (ICD-211.3) - GI = [name] and pt reports seen 3/09 w/ colonoscopy showing 4 polyps... f/u planned 69yrs. ~  adm to [name] 5/09 w/ lower GI bleed likely from Divertics- self-limited, didn't req Tx etc...  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 ~  pt reports PSA 3/10 was <2 ~  labs here 2/11 showed PSA= 1.13 ~  Labs 5/12 showed PSA= 1.67 ~  Labs 6/13 showed PSA= 0.95  OSTEOARTHRITIS & ORTHOPEDIC ISSUES: LOW BACK PAIN, CHRONIC (ICD-724.2) - he has a hx of LBP and FM-like symptoms... states doing well w/ exercise program. ~  8/11:  fall from bed of truck w/ left prox humerus fx & surg [name] (shoulder replacement)- lots of rehab but still has decr ROM shoulder. 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  ANXIETY (ICD-300.00) - takes ALPRAZOLAM 0.5mg  Prn...  Health Maintenance - up to date on colonoscopy and prostate evals... he had TETANUS shot 2003, and PNEUMOVAX at age 16 in 2003- repeat PNEUMOVAX given 2/11 at age 9... he gets yearly Flu shots as well...  Given TDAP 6/13...   Past Surgical History    Procedure Date  . Inguinal hernia repair     left  . Rotator cuff repair 5/08    left by Dr Rennis Chris  . 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 Leg surgery     Outpatient Encounter Prescriptions as of 02/21/2012  Medication Sig Dispense Refill  . ALPRAZolam (XANAX) 0.5 MG tablet 1/2 - 1 tab by mouth three times daily as needed for nerves  90 tablet  5  . amLODipine-benazepril (LOTREL) 10-20 MG per capsule Take 1 capsule by mouth daily.  30 capsule  2  . aspirin 81 MG tablet Take 81 mg by mouth daily.        . cyclobenzaprine (FLEXERIL) 5 MG tablet Take 1 tablet (5 mg total) by mouth 3 (three) times daily as needed for muscle spasms.  30 tablet  1  . lansoprazole (PREVACID) 30 MG capsule Take 1 capsule (30 mg total) by mouth daily.  90 capsule  2  . meclizine (ANTIVERT) 25 MG tablet 1/2 - 1 tab by mouth up to three times daily as needed for dizziness  90 tablet  5  . meloxicam (MOBIC) 15 MG tablet Take 1 tablet (15 mg total) by mouth daily as needed for pain.  30 tablet  1  . metaxalone (SKELAXIN) 800 MG tablet Take 1 tablet (800 mg total) by mouth 3 (three) times daily.  30 tablet  1  . simvastatin (ZOCOR) 40 MG tablet Take 0.5 tablets (20 mg total) by mouth at bedtime.  30 tablet  11  . Tamsulosin HCl (FLOMAX) 0.4 MG CAPS Take 0.4 mg by mouth daily after supper.         No Known Allergies   Current Medications, Allergies, Past Medical History, Past Surgical History, Family History, and Social History were reviewed in [employer] record.   Review of Systems         See HPI - all other systems neg except  as noted... The patient complains of dyspnea on exertion.  The patient denies anorexia, fever, weight loss, weight gain, vision loss, decreased hearing, hoarseness, chest pain, syncope, peripheral edema, prolonged cough, headaches, hemoptysis, abdominal pain, melena, hematochezia, severe indigestion/heartburn, hematuria, incontinence,  muscle weakness, suspicious skin lesions, transient blindness, difficulty walking, depression, unusual weight change, abnormal bleeding, enlarged lymph nodes, and angioedema.   Objective:   Physical Exam      WD, WN, 72 y/o WM in NAD... GENERAL:  Alert & oriented; pleasant & cooperative... HEENT:  Pound/AT, EOM-full, PERRLA, EACs-clear, TMs-wnl, NOSE-clear, THROAT-clear & wnl. NECK:  Supple w/ fairROM; no JVD; normal carotid impulses w/o bruits; no thyromegaly or nodules palpated; no lymphadenopathy. CHEST:  Clear to P & A; without wheezes/ rales/ or rhonchi heard...  HEART:  Regular Rhythm; without murmurs/ rubs/ or gallops detected... ABDOMEN:  Soft & nontender; normal bowel sounds; no organomegaly or masses palpated... EXT:  s/p left humerus surg, mild arthritic changes; no varicose veins/ venous insuffic/ or edema. NEURO:  CN's intact; motor testing normal; sensory testing normal; gait normal & balance OK. DERM:  No lesions noted; periumbil rash improved...  RADIOLOGY DATA:  Reviewed in the EPIC EMR & discussed w/ the patient...  LABORATORY DATA:  Reviewed in the EPIC EMR & discussed w/ the patient...   Assessment & Plan:    HBP>  Controlled on Lotrel + diet/ exercise, reminded to elim salt etc...  Hx of CP>  On ASA, no further prob since 12/11 trip to ER w/ neg eval at that time...  CHOL>  On Simva20 now + diet efforts w/ FLP ok x LDL=118; rec continue same for now 7 better diet efforts...  GI> HxPUD, GERD, Divertics, Polyps>  Followed by [facility] & his note from 3/13 is reviewed; continue current meds...  ORTHO>  DJD, LBP, etc>  He is still doing his home therapy for the left shoulder injury; c/o incr pain in neck=> left arm; XRays w/ DDD & we will Rx w/ Mobic, Skelaxin, rest, heat & refer if symptoms persist.  ANXIETY>  Continue Alpraz prn use...   Patient's Medications  New Prescriptions   No medications on file  Previous Medications   ASPIRIN 81 MG TABLET    Take 81 mg  by mouth daily.     LANSOPRAZOLE (PREVACID) 30 MG CAPSULE    Take 1 capsule (30 mg total) by mouth daily.   MECLIZINE (ANTIVERT) 25 MG TABLET    1/2 - 1 tab by mouth up to three times daily as needed for dizziness   MELOXICAM (MOBIC) 15 MG TABLET    Take 1 tablet (15 mg total) by mouth daily as needed for pain.   METAXALONE (SKELAXIN) 800 MG TABLET    Take 1 tablet (800 mg total) by mouth 3 (three) times daily.   TAMSULOSIN HCL (FLOMAX) 0.4 MG CAPS    Take 0.4 mg by mouth daily after supper.   Modified Medications   Modified Medication Previous Medication   ALPRAZOLAM (XANAX) 0.5 MG TABLET ALPRAZolam (XANAX) 0.5 MG tablet      1/2 - 1 tab by mouth three times daily as needed for nerves    1/2 - 1 tab by mouth three times daily as needed for nerves   AMLODIPINE-BENAZEPRIL (LOTREL) 10-20 MG PER CAPSULE amLODipine-benazepril (LOTREL) 10-20 MG per capsule      Take 1 capsule by mouth daily.    Take 1 capsule by mouth daily.   CYCLOBENZAPRINE (FLEXERIL) 5 MG TABLET cyclobenzaprine (FLEXERIL) 5  MG tablet      Take 1 tablet (5 mg total) by mouth 3 (three) times daily as needed for muscle spasms.    Take 1 tablet (5 mg total) by mouth 3 (three) times daily as needed for muscle spasms.   SIMVASTATIN (ZOCOR) 40 MG TABLET simvastatin (ZOCOR) 40 MG tablet      Take 40 mg by mouth at bedtime.    Take 0.5 tablets (20 mg total) by mouth at bedtime.  Discontinued Medications   No medications on file

## 2012-02-25 ENCOUNTER — Telehealth: Payer: Self-pay | Admitting: Pulmonary Disease

## 2012-02-25 NOTE — Telephone Encounter (Signed)
Called and spoke with pt and he is aware of lab and xray results per SN.  Pt is aware that SN did release these to mychart and nothing further is needed.

## 2012-02-25 NOTE — Telephone Encounter (Signed)
Spoke with patient patient requesting results of cxr and lab work from 02/21/12.  Informed him that Dr. Kriste Basque has not yet put a result note to it.  Will forward to Dr. Kriste Basque so that he is aware patient is looking for this.  Patient aware that once note is done someone from our office will call him.

## 2012-04-05 ENCOUNTER — Other Ambulatory Visit: Payer: Self-pay | Admitting: Pulmonary Disease

## 2012-04-05 MED ORDER — AMLODIPINE BESY-BENAZEPRIL HCL 10-20 MG PO CAPS: 1.0000 | ORAL_CAPSULE | Freq: Every day | ORAL | Status: DC

## 2012-05-03 ENCOUNTER — Telehealth: Payer: Self-pay | Admitting: Pulmonary Disease

## 2012-05-03 MED ORDER — MUPIROCIN 2 % EX OINT: TOPICAL_OINTMENT | CUTANEOUS | Status: DC

## 2012-05-03 NOTE — Telephone Encounter (Signed)
I spoke with renee and is aware of SN recs. She would like the Bactroban to be called into [facility]. I have done so. Nothing further was needed

## 2012-05-03 NOTE — Telephone Encounter (Signed)
Per SN----   bactroban ointment to use on the sore.   Suggest to renee that prob need refer to [facility]ressa [facility]enter [facility].   thanks

## 2012-05-03 NOTE — Telephone Encounter (Signed)
Spoke Renee-- patients daughter Per daughter patient has a Sore place on hand-- red and somewhat scabbing over that has been there x 3weeks Patient came to pharmacy this morning and Pharmacist looked at it suggested an antibiotic ointment Renee stated he had been using Neosporin but this has not been healing it. Requesting recs from Dr. Kriste Basque, please advise, thank you   Last OV: 02/21/12 w/ 6 mo F/U Next OV: 08/21/12  No Known Allergies

## 2012-05-08 ENCOUNTER — Encounter: Payer: Self-pay | Admitting: Pulmonary Disease

## 2012-05-10 ENCOUNTER — Other Ambulatory Visit: Payer: Self-pay | Admitting: Pulmonary Disease

## 2012-05-10 MED ORDER — AMLODIPINE BESY-BENAZEPRIL HCL 10-20 MG PO CAPS: 1.0000 | ORAL_CAPSULE | Freq: Every day | ORAL | Status: DC

## 2012-06-06 ENCOUNTER — Telehealth: Payer: Self-pay | Admitting: Pulmonary Disease

## 2012-06-06 MED ORDER — AMLODIPINE BESY-BENAZEPRIL HCL 10-20 MG PO CAPS: 1.0000 | ORAL_CAPSULE | Freq: Every day | ORAL | Status: DC

## 2012-06-06 NOTE — Telephone Encounter (Signed)
RX was sent to [facility] on 05/10/12. i spoke with pt and he stated he never got that filled at [facility] and he needs rx sent to [facility]. rx has been sent

## 2012-06-19 ENCOUNTER — Ambulatory Visit (INDEPENDENT_AMBULATORY_CARE_PROVIDER_SITE_OTHER): Payer: Medicare Other | Admitting: Pulmonary Disease

## 2012-06-19 ENCOUNTER — Encounter: Payer: Self-pay | Admitting: Pulmonary Disease

## 2012-06-19 ENCOUNTER — Other Ambulatory Visit (INDEPENDENT_AMBULATORY_CARE_PROVIDER_SITE_OTHER): Payer: Medicare Other

## 2012-06-19 VITALS — BP 134/88 | HR 51 | Temp 97.7°F | Ht 68.5 in | Wt 167.6 lb

## 2012-06-19 DIAGNOSIS — I10 Essential (primary) hypertension: Secondary | ICD-10-CM

## 2012-06-19 DIAGNOSIS — K219 Gastro-esophageal reflux disease without esophagitis: Secondary | ICD-10-CM

## 2012-06-19 DIAGNOSIS — N200 Calculus of kidney: Secondary | ICD-10-CM

## 2012-06-19 DIAGNOSIS — M545 Low back pain: Secondary | ICD-10-CM

## 2012-06-19 DIAGNOSIS — E7800 Pure hypercholesterolemia, unspecified: Secondary | ICD-10-CM

## 2012-06-19 DIAGNOSIS — N139 Obstructive and reflux uropathy, unspecified: Secondary | ICD-10-CM

## 2012-06-19 DIAGNOSIS — N401 Enlarged prostate with lower urinary tract symptoms: Secondary | ICD-10-CM

## 2012-06-19 DIAGNOSIS — F411 Generalized anxiety disorder: Secondary | ICD-10-CM

## 2012-06-19 DIAGNOSIS — R0789 Other chest pain: Secondary | ICD-10-CM

## 2012-06-19 DIAGNOSIS — N138 Other obstructive and reflux uropathy: Secondary | ICD-10-CM

## 2012-06-19 DIAGNOSIS — K5730 Diverticulosis of large intestine without perforation or abscess without bleeding: Secondary | ICD-10-CM

## 2012-06-19 DIAGNOSIS — D126 Benign neoplasm of colon, unspecified: Secondary | ICD-10-CM

## 2012-06-19 DIAGNOSIS — M1990 Unspecified osteoarthritis, unspecified site: Secondary | ICD-10-CM

## 2012-06-19 LAB — BASIC METABOLIC PANEL
BUN: 23 mg/dL (ref 6–23)
CO2: 28 mEq/L (ref 19–32)
Glucose, Bld: 88 mg/dL (ref 70–99)
Potassium: 4.4 mEq/L (ref 3.5–5.1)
Sodium: 140 mEq/L (ref 135–145)

## 2012-06-19 LAB — HEPATIC FUNCTION PANEL
ALT: 22 U/L (ref 0–53)
AST: 20 U/L (ref 0–37)
Albumin: 4.1 g/dL (ref 3.5–5.2)
Alkaline Phosphatase: 53 U/L (ref 39–117)
Total Protein: 7 g/dL (ref 6.0–8.3)

## 2012-06-19 LAB — TSH: TSH: 1.54 u[IU]/mL (ref 0.35–5.50)

## 2012-06-19 LAB — CBC WITH DIFFERENTIAL/PLATELET
Basophils Absolute: 0 10*3/uL (ref 0.0–0.1)
Lymphocytes Relative: 26.3 % (ref 12.0–46.0)
Lymphs Abs: 1.7 10*3/uL (ref 0.7–4.0)
Monocytes Relative: 8.2 % (ref 3.0–12.0)
Neutrophils Relative %: 63.9 % (ref 43.0–77.0)
Platelets: 194 10*3/uL (ref 150.0–400.0)
RDW: 13.6 % (ref 11.5–14.6)
WBC: 6.5 10*3/uL (ref 4.5–10.5)

## 2012-06-19 LAB — PSA: PSA: 1.03 ng/mL (ref 0.10–4.00)

## 2012-06-19 LAB — LIPID PANEL: Cholesterol: 180 mg/dL (ref 0–200)

## 2012-06-19 NOTE — Patient Instructions (Addendum)
Today we updated your med list in our EPIC system...    Continue your current medications the same...  Today we did your follow up FASTING blood work...    We will contact you w/ the results when available...   Call for any questions...  Let's plan a follow up visit in 6mo, sooner if needed for problems...   

## 2012-08-21 ENCOUNTER — Ambulatory Visit: Payer: Medicare Other | Admitting: Pulmonary Disease

## 2012-09-01 ENCOUNTER — Encounter: Payer: Self-pay | Admitting: Adult Health

## 2012-09-01 ENCOUNTER — Ambulatory Visit (INDEPENDENT_AMBULATORY_CARE_PROVIDER_SITE_OTHER): Payer: Self-pay | Admitting: Adult Health

## 2012-09-01 ENCOUNTER — Ambulatory Visit (INDEPENDENT_AMBULATORY_CARE_PROVIDER_SITE_OTHER)
Admission: RE | Admit: 2012-09-01 | Discharge: 2012-09-01 | Disposition: A | Discharge status: Home / Self Care | Payer: Medicare Other | Source: Ambulatory Visit | Attending: Adult Health | Admitting: Adult Health

## 2012-09-01 VITALS — BP 122/80 | HR 69 | Temp 97.9°F | Ht 68.5 in | Wt 166.0 lb

## 2012-09-01 DIAGNOSIS — R05 Cough: Secondary | ICD-10-CM

## 2012-09-01 DIAGNOSIS — J309 Allergic rhinitis, unspecified: Secondary | ICD-10-CM

## 2012-09-01 DIAGNOSIS — R059 Cough, unspecified: Secondary | ICD-10-CM

## 2012-09-01 MED ORDER — AZELASTINE-FLUTICASONE 137-50 MCG/ACT NA SUSP: 2.0000 | Freq: Every day | NASAL | Status: DC

## 2012-09-01 MED ORDER — AZITHROMYCIN 250 MG PO TABS: ORAL_TABLET | ORAL | Status: AC

## 2012-09-01 MED ORDER — HYDROCODONE-HOMATROPINE 5-1.5 MG/5ML PO SYRP: ORAL_SOLUTION | ORAL | Status: DC

## 2012-09-01 NOTE — Progress Notes (Signed)
Subjective:    Patient ID: Mark Jordan, male    DOB: January 27, 1941, 72 y.o.   MRN: [id number]  HPI 72 y/o WM    ~  December 07, 2010:  30mo ROV & he reports recent trip to [place] for abd discomfort & diarrhea> no additional work-up, just given Cipro & Flagyl Rx + bland diet;  He reports improved on Rx & denies lower abd cramping, blood in stool etc;  He is followed by [facility] for GI> his last note from 3/12 is reviewed: Chr reflux on Prev30Bid, ?GB dysfunction, ?had surg consult at that time, f/u colonoscopy due 04/2012...    BP controlled on his Lotrel w/ BP= 128/74 today & similar at home;  He denies recurrent CP, palpit, dizzy, syncope, SOB, or edema;  Chol managed w/ Simva40 & due for f/u FLP (may need stronger statin Rx);  He saw [name] 2wks ago for Urology f/u & he reports doing well, no changes made;  Requesting refill of his Alprazolam today and he refuses the 2012 Flu vaccine...  ~  July 21, 2011:  35mo ROV & [name] has had a good interval w/o new complaints or concerns; he tells me he is not taking Mobic or Vicodin 7 just uses Tylenol for discomfort- we updated his EPIC med list accordingly... We gave him the TDAP vaccination today... HBP> on Lotrel10-20; BP= 142/90 & we reviewed the need for low sodium diet & home BP monitoring... 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 Chol> on Simva40- taking 1/2 daily; FLP showed TChol 170, TG 43, HDL 43, LDL 118; decided on same med, better diet... GI> GERD, HxPUD, Divertics, Polyps> followed by [facility] & he remains on Prev30Bid; pt didn't mention Dexilant or BiosLifeSlim today... GU> Hx KidStone, BPH> followed by [facility] on Flomax0.4; PSA is wnl at 0.95... Ortho> DJD, LBP, left shoulder injury> followed by [name] but not on any prescription meds- just Tylenol etc OTC...  Anxiety> on Xanax0.5mg  prn...    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 We reviewed the following medical problems during today's office visit >>     HBP> on Lotrel10-20; BP= 142/78 & he denies CP, palpit, SOB, edema; we reviewed the need for low sodium diet & home BP monitoring...    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    Chol> on Simva40- taking 1/2 daily; FLP showed TChol 180, TG 56, HDL 39, LDL 130; decided to incr Simva40/d & better diet...    GI> GERD, HxPUD, Divertics, Polyps> followed by [facility] & he remains on Prev30Bid; pt didn't mention Dexilant or BiosLifeSlim today...    GU> Hx KidStone, BPH> followed by [facility] on Flomax0.4; PSA is wnl at 0.95...    Ortho> DJD, LBP, left shoulder injury> followed by [name] but not on any prescription meds- just Tylenol etc OTC...     Anxiety> on Xanax0.5mg  prn... We  reviewed prob list, meds, xrays and labs> see below for updates >>  Cspine XRay> 1/14 showed DDD from C4-5 thru C7-T1 w/ disc sp narrowing & ant spurs, no fx... LABS 1/14:  FLP- not at goals on Simva20 w/ LDL=130 (Rec incr 40mg /d);  Chems- wnl...  ~  Jun 19, 2012:  60mo ROV & [name] is c/o feeling light headed, wants BP & Labs checked today; he had a skin cancer removed from his right hand several wks ago & Derm noted BP was "low" at that time per pt; Home BP checks all 130's/ 70-80;  He had Urology f/u by [facility] & doing ok but pt upset that he didn't do a PSA & he wants it done!  We reviewed the following medical problems during today's office visit >>     HBP> on Lotrel10-20; BP=  134/88 & he denies CP, palpit, SOB, edema; we reviewed the need for low sodium diet & home BP monitoring...    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    Chol> on Simva40; FLP showed TChol 180, TG 44, HDL 39, LDL 133; needs better diet, incr exercise, continue 40mg /d...    GI> GERD, HxPUD, Divertics, Polyps, Hems> followed by [facility] & he remains on Prev30/d; had colonoscopy 2/14> +divertics, hems, one polyp removed (?polypoid mucosa) f/u planned 25yrs; subseq Hem banding...    GU> Hx KidStone, BPH> followed by [facility] on Flomax0.4; seen recently & pt reports doing well- no note, he wants PSA checked 5/14= 1.03 & he is reassured...    Ortho> DJD, LBP, left shoulder injury> followed by [name] on Mobic15, Skelaxin, Flexeril...    Anxiety> on Xanax0.5mg  prn... We reviewed prob list, meds, xrays and labs> see below for updates >>  LABS 5/14:  FLP- on on Simva40 x LDL=133;  Chems- wnl;  CBC- wnl;  TSH=1.54;  PSA=1.03 & he is reassured...  09/01/2012 Acute OV  Complains of head congestion, PND, prod cough with yellow mucus, minimal wheezing/SOB x1 month.  pt is requesting cxr today Has been using mucinex without much help.  Has lots of drainage in throat.  No fever, chest pain, orthopnea, edema, n/v.  No recent travel or abx use.         Problem List:  ALLERGIC RHINITIS (ICD-477.9) - uses OTC meds Prn...  HYPERTENSION (ICD-401.9) - on LOTREL 10-20/d w/ good control of BP... ~  6/13:  on Lotrel10-20; BP= 142/90 & we reviewed the need for low sodium diet & home BP monitoring. ~  1/14:  on Lotrel10-20; BP= 142/78 & he denies CP, palpit, SOB, edema; we reviewed the need for low sodium diet & home BP monitoring. ~  5/14:  on Lotrel10-20; BP= 134/88 & he denies CP, palpit, SOB, edema; continue same for now...  CHEST PAIN, ATYPICAL (ICD-786.59) - hx CP and tachy palpit in 2003 w/ neg cardiac eval from [name]... on ASA  81mg /d, and asymptomatic currently. ~  NuclearStressTest 6/03 showed no infarct, ? mild ischemia inferiorly, EF= 57%... ~  [name] 6/03 was negative without stress induced wall motion abn... ~  12/11:  CP eval via ER w/ neg EKG, neg ENZ, & norm StressEcho; CXR showed norm heart size, clear lungs, NAD... ~  1/13:  He had f/u [name]> doing satis w/o angina, occas atypCP; no changes made.. ~  10/13: he had f/u [name]> long hx atypCP, neg stress echo 12/11, c/o shoulder & arm pain=> felt to be musculoskeletal... ~  EKG 10/13 showed SBrady, rate56, otherw wnl...  HYPERCHOLESTEROLEMIA (ICD-272.0) - on SIMVASTATIN + FISH OIL 1000mg /d... ~  FLP 3/09 showed TChol 157, TG 33, HDL 38, LDL 113... Imroved, continue same med + diet. ~  FLP 3/10 showed TChol 145, TG 32, HDL 40, LDL 99 ~  FLP 2/11 showed TChol 199, TG 34, HDL 54, LDL 139... rec> same med, better diet, get wt down. ~  FLP 11/11 showed TChol 168, TG 50, HDL 41, LDL 117 ~  FLP 5/12 on Simva40+FishOil showed TChol 181, TG 30, HDL 41, LDL 134... Discussed better diet. ~  FLP 11/12 on Simva40+FishOil showed TChol 79, TG 15, HDL 42, LDL 34... Hard to believe numbers on same med, and SGOT/ SGPT are elev also;  REC to HOLD Simva40 for 1 month & repeat FLP/ Liver panel at that time... ~  Repeat FLP 12/12 off Simva for 30d showed TChol 195, TG 35, HDL 44, LDL 144, and LFTs were back to normal; rec start Simva20. ~  FLP 6/13 on Simva20 showed TChol 170, TG 43, HDL 43, LDL 118... Keep same + better diet. ~  FLP 1/14 on Simva20 showed TChol 180, TG 56, HDL 39, LDL 130... We decided to incr Simva40 ~  FLP 5/14 on Simva40 showed TChol 180, TG 44, HDL 39, LDL 133  GERD (ICD-530.81)  PUD, HX OF (ICD-V12.71) - on PREVACID 30mg Bid now ... followed by [facility] for GI. ~  EGD 2004 showed large HH & schatzki ring... ~  AbdSonar 10/04 was WNL... ~  EDG 1/10 repeated for dysphagia w/ HH, schatzki ring, erosive esophagitis...dilated & PPI incr to Bid... ~   [facility] Hepatobiliary scan 3/11 showed patent ducts, GB EF=31.5% (normal>30%), pt did have symptoms during the CCK infusion. ~  [name]'s note indicated surg consultation; pt indicates that they didn't feel he needed GB removed. ~  AbdSonar 11/12 showed normal liver, spleen, no ascites; norm GB x 3mm adherent ?stone vs polyp within, right renal cyst ~  [name]'s note from 3/13 indicated that he changed pt from Prev30Bid to Dexilant60 Qam; and taking "BiosLifeSlim" bid...  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 f/u planned 45yrs. ~  [facility] to [name] w/ lower GI bleed likely from Divertics- self-limited, didn't req Tx etc... ~  2/14: he had f/u colonoscopy by [name]> +divertics, hems, one polyp (?polypoid mucosa) f/u planned 54yrs... ~  4/14: he had hemorrhoid banding by [facility] w/ post banding bleed- now improved 7 on-going management by GI...  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 ~  pt reports PSA 3/10 was <2 ~  labs here 2/11 showed PSA= 1.13 ~  Labs 5/12 showed PSA= 1.67 ~  Labs 6/13 showed PSA= 0.95 ~  Labs 5/14 showed PSA= 1.03 (pt insisted on recheck PSA & is reassured).  OSTEOARTHRITIS & ORTHOPEDIC ISSUES: LOW BACK PAIN, CHRONIC (ICD-724.2) - he has a hx of LBP and FM-like symptoms... states doing well w/ exercise program. ~  8/11:  fall from bed of truck w/ left prox humerus fx & surg [name] (shoulder replacement)- lots of rehab but still has decr ROM shoulder. 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  ANXIETY (ICD-300.00) - takes ALPRAZOLAM 0.5mg  Prn...  Health  Maintenance - up to date on colonoscopy and prostate evals... he had TETANUS shot 2003, and PNEUMOVAX at age 42 in 2003- repeat PNEUMOVAX given 2/11 at age 77... he gets yearly Flu shots as well...  Given TDAP 6/13...   Past Surgical History  Procedure Laterality Date  . Inguinal hernia repair      left  . Rotator cuff repair  5/08    left by Dr Rennis Chris  . 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 Leg surgery      Outpatient Encounter Prescriptions as of 09/01/2012  Medication Sig Dispense Refill  . ALPRAZolam (XANAX) 0.5 MG tablet 1/2 - 1 tab by mouth three times daily as needed for nerves  90 tablet  3  . amLODipine-benazepril (LOTREL) 10-20 MG per capsule Take 1 capsule by mouth daily.  30 capsule  6  . aspirin 81 MG tablet Take 81 mg by mouth daily.        . cyclobenzaprine (FLEXERIL) 5 MG tablet Take 1 tablet (5 mg total) by mouth 3 (three) times daily as needed for muscle spasms.  30 tablet  1  . lansoprazole (PREVACID) 30 MG capsule Take 1 capsule (30 mg total) by mouth daily.  90 capsule  2  . meclizine (ANTIVERT) 25 MG tablet 1/2 - 1 tab by mouth up to three times daily as needed for dizziness  90 tablet  5  . meloxicam (MOBIC) 15 MG tablet Take 1 tablet (15 mg total) by mouth daily as needed for pain.  30 tablet  1  . metaxalone (SKELAXIN) 800 MG tablet Take 800 mg by mouth 3 (three) times daily as needed.      . mupirocin ointment (BACTROBAN) 2 % Apply as directed  22 g  0  . simvastatin (ZOCOR) 40 MG tablet Take 40 mg by mouth at bedtime.      . Tamsulosin HCl (FLOMAX) 0.4 MG CAPS Take 0.4 mg by mouth daily after supper.       . [DISCONTINUED] metaxalone (SKELAXIN) 800 MG tablet Take 1 tablet (800 mg total) by mouth 3 (three) times daily.  30 tablet  1   No facility-administered encounter medications on file as of 09/01/2012.    No Known Allergies   Current Medications, Allergies, Past Medical History, Past Surgical History, Family History, and  Social History were reviewed in [employer] record.   Review of Systems         See HPI - all other systems neg except as noted... The patient complains of dyspnea on exertion.  The patient denies anorexia, fever, weight loss, weight gain, vision loss, decreased hearing, hoarseness, chest pain, syncope, peripheral edema, prolonged cough, headaches, hemoptysis, abdominal pain, melena, hematochezia, severe indigestion/heartburn, hematuria, incontinence, muscle weakness, suspicious skin lesions, transient blindness, difficulty walking, depression, unusual weight change, abnormal bleeding, enlarged lymph nodes, and angioedema.   Objective:   Physical Exam      WD, WN, 72 y/o WM in NAD... GENERAL:  Alert & oriented; pleasant & cooperative... HEENT:  [place]/AT,   EACs-clear, TMs-wnl, NOSE-clear drainage , THROAT-clear drainage . NECK:  Supple w/ fairROM; no JVD; normal carotid impulses w/o bruits; no thyromegaly or nodules palpated; no lymphadenopathy. CHEST:  Clear to P & A; without wheezes/ rales/ or rhonchi heard...  HEART:  Regular Rhythm; without  murmurs/ rubs/ or gallops detected... ABDOMEN:  Soft & nontender; normal bowel sounds; no organomegaly or masses palpated... EXT:  s/p left humerus surg, mild arthritic changes; no varicose veins/ venous insuffic/ or edema. NEURO:   gait normal & balance OK. DERM:  No lesions noted; periumbil rash improved...     Assessment & Plan:

## 2012-09-01 NOTE — Assessment & Plan Note (Signed)
AR /URI   Check xray   Plan  Dymista Nasal spray 2 puffs at bedtime until sample is gone. Mucinex DM twice daily as needed. For cough and congestion. Zyrtec 10 mg daily for 1 week then as needed. For drainage. Z-Pak to have on hold if symptoms worsen or persist with discolored mucus. Hydromet 1-2 teaspoons every 4-6 hours as needed. For cough, may make you sleepy. Fluids and rest. I will call chest x-Broady result. Please contact office for sooner follow up if symptoms do not improve or worsen or seek emergency care

## 2012-09-01 NOTE — Progress Notes (Addendum)
Subjective:    Patient ID: Mark Jordan, male    DOB: 07/06/1940, 72 y.o.   MRN: [id number]  HPI 72 y/o WM here for a follow up visit... he has mult med probs as noted below...   ~  December 07, 2010:  9mo ROV & he reports recent trip to [employer] for abd discomfort & diarrhea> no additional work-up, just given Cipro & Flagyl Rx + bland diet;  He reports improved on Rx & denies lower abd cramping, blood in stool etc;  He is followed by [facility] for GI> his last note from 3/12 is reviewed: Chr reflux on Prev30Bid, ?GB dysfunction, ?had surg consult at that time, f/u colonoscopy due 04/2012...    BP controlled on his Lotrel w/ BP= 128/74 today & similar at home;  He denies recurrent CP, palpit, dizzy, syncope, SOB, or edema;  Chol managed w/ Simva40 & due for f/u FLP (may need stronger statin Rx);  He saw [name] 2wks ago for Urology f/u & he reports doing well, no changes made;  Requesting refill of his Alprazolam today and he refuses the 2012 Flu vaccine...  ~  July 21, 2011:  32mo ROV & Mark Jordan has had a good interval w/o new complaints or concerns; he tells me he is not taking Mobic or Vicodin 7 just uses Tylenol for discomfort- we updated his EPIC med list accordingly... We gave him the TDAP vaccination today... HBP> on Lotrel10-20; BP= 142/90 & we reviewed the need for low sodium diet & home BP monitoring... 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 Chol> on Simva40- taking 1/2 daily; FLP showed TChol 170, TG 43, HDL 43, LDL 118; decided on same med, better diet... GI> GERD, HxPUD, Divertics, Polyps> followed by [facility] & he remains on Prev30Bid; pt didn't mention Dexilant or BiosLifeSlim today... GU> Hx KidStone, BPH> followed by [facility] on Flomax0.4; PSA is wnl at 0.95... Ortho> DJD, LBP, left shoulder injury> followed by [name] but not on any prescription meds- just Tylenol etc OTC...  Anxiety> on Xanax0.5mg  prn...    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 We reviewed the following medical problems during today's office visit >>     HBP> on Lotrel10-20; BP= 142/78 & he denies CP, palpit, SOB, edema; we reviewed the need for low sodium diet & home BP monitoring...    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    Chol> on Simva40- taking 1/2 daily; FLP showed TChol 180, TG 56, HDL 39, LDL 130; decided to incr Simva40/d & better diet...    GI> GERD, HxPUD, Divertics, Polyps> followed by [facility] & he remains on Prev30Bid; pt didn't mention Dexilant or BiosLifeSlim today...    GU> Hx KidStone, BPH> followed by [facility] on Flomax0.4; PSA is wnl at 0.95...    Ortho> DJD, LBP, left shoulder injury> followed by [name] but not on any prescription meds-  just Tylenol etc OTC...     Anxiety> on Xanax0.5mg  prn... 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 LABS 1/14:  FLP- not at goals on Simva20 w/ LDL=130 (Rec incr 40mg /d);  Chems- wnl...  ~  Jun 19, 2012:  45mo ROV & Mark Jordan is c/o feeling light headed, wants BP & Labs checked today; he had a skin cancer removed from his right hand several wks ago & Derm noted BP was "low" at that time per pt; Home BP checks all 130's/ 70-80;  He had Urology f/u by [facility] & doing ok but pt upset that he didn't do a PSA & he wants it done!  We reviewed the following medical  problems during today's office visit >>     HBP> on Lotrel10-20; BP= 134/88 & he denies CP, palpit, SOB, edema; we reviewed the need for low sodium diet & home BP monitoring...    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    Chol> on Simva40; FLP showed TChol 180, TG 44, HDL 39, LDL 133; needs better diet, incr exercise, continue 40mg /d...    GI> GERD, HxPUD, Divertics, Polyps, Hems> followed by [facility] & he remains on Prev30/d; had colonoscopy 2/14> +divertics, hems, one polyp removed (?polypoid mucosa) f/u planned 9yrs; subseq Hem banding...    GU> Hx KidStone, BPH> followed by [facility] on Flomax0.4; seen recently & pt reports doing well- no note, he wants PSA checked 5/14= 1.03 & he is reassured...    Ortho> DJD, LBP, left shoulder injury> followed by [name] on Mobic15, Skelaxin, Flexeril...    Anxiety> on Xanax0.5mg  prn... We reviewed prob list, meds, xrays and labs> see below for updates >>  LABS 5/14:  FLP- on on Simva40 x LDL=133;  Chems- wnl;  CBC- wnl;  TSH=1.54;  PSA=1.03 & he is reassured...          Problem List:  ALLERGIC RHINITIS (ICD-477.9) - uses OTC meds Prn...  HYPERTENSION (ICD-401.9) - on LOTREL 10-20/d w/ good control of BP... ~  6/13:  on Lotrel10-20; BP= 142/90 & we reviewed the need for low sodium diet & home BP monitoring. ~  1/14:  on Lotrel10-20; BP= 142/78 & he denies CP, palpit, SOB, edema; we reviewed the need for low sodium diet & home BP monitoring. ~  5/14:  on Lotrel10-20; BP= 134/88 & he denies CP, palpit, SOB, edema; continue same for now...  CHEST PAIN, ATYPICAL (ICD-786.59) - hx CP and tachy palpit in 2003 w/ neg cardiac eval from [name]... on ASA 81mg /d, and asymptomatic currently. ~  NuclearStressTest 6/03 showed no infarct, ? mild ischemia inferiorly, EF= 57%... ~  [name] 6/03 was negative without stress induced wall motion abn... ~  12/11:  CP eval via ER w/ neg  EKG, neg ENZ, & norm StressEcho; CXR showed norm heart size, clear lungs, NAD... ~  1/13:  He had f/u [name]> doing satis w/o angina, occas atypCP; no changes made.. ~  10/13: he had f/u [name]> long hx atypCP, neg stress echo 12/11, c/o shoulder & arm pain=> felt to be musculoskeletal... ~  EKG 10/13 showed SBrady, rate56, otherw wnl...  HYPERCHOLESTEROLEMIA (ICD-272.0) - on SIMVASTATIN + FISH OIL 1000mg /d... ~  FLP 3/09 showed TChol 157, TG 33, HDL 38, LDL 113... Imroved, continue same med + diet. ~  FLP 3/10 showed TChol 145, TG 32, HDL 40, LDL 99 ~  FLP 2/11 showed TChol 199, TG 34, HDL 54, LDL 139... rec> same med, better diet, get wt down. ~  FLP 11/11 showed TChol 168, TG 50, HDL 41, LDL 117 ~  FLP 5/12 on Simva40+FishOil showed TChol 181, TG 30, HDL 41, LDL 134... Discussed better diet. ~  FLP 11/12 on Simva40+FishOil showed TChol 79, TG 15, HDL 42, LDL 34... Hard to believe numbers on same med, and SGOT/ SGPT are elev also;  REC to HOLD Simva40 for 1 month & repeat FLP/ Liver panel at that time... ~  Repeat FLP 12/12 off Simva for 30d showed TChol 195, TG 35, HDL 44, LDL 144, and LFTs were back to normal; rec start Simva20. ~  FLP 6/13 on Simva20 showed TChol 170, TG 43, HDL 43, LDL 118... Keep same + better diet. ~  FLP 1/14 on Simva20 showed TChol 180, TG 56, HDL 39, LDL 130... We decided to incr Simva40 ~  FLP 5/14 on Simva40 showed TChol 180, TG 44, HDL 39, LDL 133  GERD (ICD-530.81)  PUD, HX OF (ICD-V12.71) - on PREVACID 30mg Bid now ... followed [facility] for GI. ~  EGD 2004 showed large HH & schatzki ring... ~  AbdSonar 10/04 was WNL... ~  EDG 1/10 repeated for dysphagia w/ HH, schatzki ring, erosive esophagitis...dilated & PPI incr to Bid... ~  [facility] Hepatobiliary scan 3/11 showed patent ducts, GB EF=31.5% (normal>30%), pt did have symptoms during the CCK infusion. ~  [name]'s note indicated surg consultation; pt indicates that they didn't feel he needed GB  removed. ~  AbdSonar 11/12 showed normal liver, spleen, no ascites; norm GB x 3mm adherent ?stone vs polyp within, right renal cyst ~  [name]'s note from 3/13 indicated that he changed pt from Prev30Bid to Dexilant60 Qam; and taking "BiosLifeSlim" bid...  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 f/u planned 12yrs. ~  [facility] to [name] w/ lower GI bleed likely from Divertics- self-limited, didn't req Tx etc... ~  2/14: he had f/u colonoscopy by [name]> +divertics, hems, one polyp (?polypoid mucosa) f/u planned 2yrs... ~  4/14: he had hemorrhoid banding by [facility] w/ post banding bleed- now improved 7 on-going management by GI...  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 ~  pt reports PSA 3/10 was <2 ~  labs here 2/11 showed PSA= 1.13 ~  Labs 5/12 showed PSA= 1.67 ~  Labs 6/13 showed PSA= 0.95 ~  Labs 5/14 showed PSA= 1.03 (pt insisted on recheck PSA & is reassured).  OSTEOARTHRITIS & ORTHOPEDIC ISSUES: LOW BACK PAIN, CHRONIC (ICD-724.2) - he has a hx of LBP and FM-like symptoms... states doing well w/ exercise program. ~  8/11:  fall from bed of truck w/ left prox humerus fx & surg [name] (shoulder replacement)- lots of rehab but still has decr ROM shoulder. 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  ANXIETY (ICD-300.00) - takes ALPRAZOLAM 0.5mg  Prn...  Health Maintenance - up to date on colonoscopy and prostate evals... he had TETANUS shot 2003, and  PNEUMOVAX at age 84 in 33- repeat PNEUMOVAX given 2/11 at age 46... he gets yearly Flu shots as well...  Given TDAP  6/13...   Past Surgical History  Procedure Laterality Date  . Inguinal hernia repair      left  . Rotator cuff repair  5/08    left by Dr Rennis Chris  . 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 Leg surgery      Outpatient Encounter Prescriptions as of 06/19/2012  Medication Sig Dispense Refill  . ALPRAZolam (XANAX) 0.5 MG tablet 1/2 - 1 tab by mouth three times daily as needed for nerves  90 tablet  3  . amLODipine-benazepril (LOTREL) 10-20 MG per capsule Take 1 capsule by mouth daily.  30 capsule  6  . aspirin 81 MG tablet Take 81 mg by mouth daily.        . cyclobenzaprine (FLEXERIL) 5 MG tablet Take 1 tablet (5 mg total) by mouth 3 (three) times daily as needed for muscle spasms.  30 tablet  1  . lansoprazole (PREVACID) 30 MG capsule Take 1 capsule (30 mg total) by mouth daily.  90 capsule  2  . meclizine (ANTIVERT) 25 MG tablet 1/2 - 1 tab by mouth up to three times daily as needed for dizziness  90 tablet  5  . meloxicam (MOBIC) 15 MG tablet Take 1 tablet (15 mg total) by mouth daily as needed for pain.  30 tablet  1  . metaxalone (SKELAXIN) 800 MG tablet Take 1 tablet (800 mg total) by mouth 3 (three) times daily.  30 tablet  1  . mupirocin ointment (BACTROBAN) 2 % Apply as directed  22 g  0  . simvastatin (ZOCOR) 40 MG tablet Take 40 mg by mouth at bedtime.      . Tamsulosin HCl (FLOMAX) 0.4 MG CAPS Take 0.4 mg by mouth daily after supper.        No facility-administered encounter medications on file as of 06/19/2012.    No Known Allergies   Current Medications, Allergies, Past Medical History, Past Surgical History, Family History, and Social History were reviewed in [employer] record.   Review of Systems         See HPI - all other systems neg except as noted... The patient complains of dyspnea on exertion.  The patient denies anorexia, fever, weight loss, weight gain, vision loss, decreased hearing, hoarseness, chest  pain, syncope, peripheral edema, prolonged cough, headaches, hemoptysis, abdominal pain, melena, hematochezia, severe indigestion/heartburn, hematuria, incontinence, muscle weakness, suspicious skin lesions, transient blindness, difficulty walking, depression, unusual weight change, abnormal bleeding, enlarged lymph nodes, and angioedema.   Objective:   Physical Exam      WD, WN, 72 y/o WM in NAD... GENERAL:  Alert & oriented; pleasant & cooperative... HEENT:  [place]/AT, EOM-full, PERRLA, EACs-clear, TMs-wnl, NOSE-clear, THROAT-clear & wnl. NECK:  Supple w/ fairROM; no JVD; normal carotid impulses w/o bruits; no thyromegaly or nodules palpated; no lymphadenopathy. CHEST:  Clear to P & A; without wheezes/ rales/ or rhonchi heard...  HEART:  Regular Rhythm; without murmurs/ rubs/ or gallops detected... ABDOMEN:  Soft & nontender; normal bowel sounds; no organomegaly or masses palpated... EXT:  s/p left humerus surg, mild arthritic changes; no varicose veins/ venous insuffic/ or edema. NEURO:  CN's intact; motor testing normal; sensory testing normal; gait normal & balance OK. DERM:  No lesions noted; periumbil rash improved...  RADIOLOGY DATA:  Reviewed in  the EPIC EMR & discussed w/ the patient...  LABORATORY DATA:  Reviewed in the EPIC EMR & discussed w/ the patient...   Assessment & Plan:    HBP>  Controlled on Lotrel + diet/ exercise, reminded to elim salt etc...  Hx of CP>  On ASA, no further prob since 12/11 trip to ER w/ neg eval at that time...  CHOL>  On Simva40 now + diet efforts w/ FLP ok x LDL=133; rec continue same for now & better diet efforts...  GI> HxPUD, GERD, Divertics, Polyps>  Followed by [facility] & his notes from Feb-Apr 2014 are reviewed; continue current meds...  ORTHO>  DJD, LBP, etc>  He is still doing his home therapy for the left shoulder injury; c/o incr pain in neck=> left arm; XRays w/ DDD & we will Rx w/ Mobic, Skelaxin, rest, heat & refer if symptoms  persist.  ANXIETY>  Continue Alpraz prn use...   Patient's Medications  New Prescriptions   No medications on file  Previous Medications   ALPRAZOLAM (XANAX) 0.5 MG TABLET    1/2 - 1 tab by mouth three times daily as needed for nerves   AMLODIPINE-BENAZEPRIL (LOTREL) 10-20 MG PER CAPSULE    Take 1 capsule by mouth daily.   ASPIRIN 81 MG TABLET    Take 81 mg by mouth daily.     CYCLOBENZAPRINE (FLEXERIL) 5 MG TABLET    Take 1 tablet (5 mg total) by mouth 3 (three) times daily as needed for muscle spasms.   LANSOPRAZOLE (PREVACID) 30 MG CAPSULE    Take 1 capsule (30 mg total) by mouth daily.   MECLIZINE (ANTIVERT) 25 MG TABLET    1/2 - 1 tab by mouth up to three times daily as needed for dizziness   MELOXICAM (MOBIC) 15 MG TABLET    Take 1 tablet (15 mg total) by mouth daily as needed for pain.   METAXALONE (SKELAXIN) 800 MG TABLET    Take 1 tablet (800 mg total) by mouth 3 (three) times daily.   MUPIROCIN OINTMENT (BACTROBAN) 2 %    Apply as directed   SIMVASTATIN (ZOCOR) 40 MG TABLET    Take 40 mg by mouth at bedtime.   TAMSULOSIN HCL (FLOMAX) 0.4 MG CAPS    Take 0.4 mg by mouth daily after supper.   Modified Medications   No medications on file  Discontinued Medications   No medications on file

## 2012-09-01 NOTE — Patient Instructions (Addendum)
Dymista Nasal spray 2 puffs at bedtime until sample is gone. Mucinex DM twice daily as needed. For cough and congestion. Zyrtec 10 mg daily for 1 week then as needed. For drainage. Z-Pak to have on hold if symptoms worsen or persist with discolored mucus. Hydromet 1-2 teaspoons every 4-6 hours as needed. For cough, may make you sleepy. Fluids and rest. I will call chest x-Win result. Please contact office for sooner follow up if symptoms do not improve or worsen or seek emergency care

## 2012-09-01 NOTE — Addendum Note (Signed)
Addended by: Boone Master E on: 09/01/2012 10:05 AM   Modules accepted: Orders

## 2012-09-01 NOTE — Progress Notes (Signed)
Quick Note:  Called spoke with patient's spouse. Advised of lab results / recs as stated by TP. Spouse verbalized understanding and denied any questions. ______ 

## 2012-10-12 ENCOUNTER — Other Ambulatory Visit: Payer: Self-pay | Admitting: Pulmonary Disease

## 2012-12-20 ENCOUNTER — Telehealth: Payer: Self-pay | Admitting: Pulmonary Disease

## 2012-12-20 MED ORDER — LANSOPRAZOLE 30 MG PO CPDR: 30.0000 mg | DELAYED_RELEASE_CAPSULE | Freq: Every day | ORAL | Status: DC

## 2012-12-20 MED ORDER — AMLODIPINE BESY-BENAZEPRIL HCL 10-20 MG PO CAPS: 1.0000 | ORAL_CAPSULE | Freq: Every day | ORAL | Status: DC

## 2012-12-20 NOTE — Telephone Encounter (Signed)
Rx's have been sent in to his new pharmacy. Pt's wife is aware.

## 2012-12-25 ENCOUNTER — Ambulatory Visit: Payer: Medicare Other | Admitting: Pulmonary Disease

## 2013-01-08 ENCOUNTER — Other Ambulatory Visit: Payer: Self-pay | Admitting: Pulmonary Disease

## 2013-01-09 ENCOUNTER — Other Ambulatory Visit: Payer: Self-pay | Admitting: Pulmonary Disease

## 2013-01-10 ENCOUNTER — Encounter: Payer: Self-pay | Admitting: Adult Health

## 2013-01-10 ENCOUNTER — Ambulatory Visit (INDEPENDENT_AMBULATORY_CARE_PROVIDER_SITE_OTHER): Payer: Medicare Other | Admitting: Adult Health

## 2013-01-10 VITALS — BP 122/64 | HR 86 | Temp 97.3°F | Ht 68.5 in | Wt 170.6 lb

## 2013-01-10 DIAGNOSIS — M25512 Pain in left shoulder: Secondary | ICD-10-CM

## 2013-01-10 DIAGNOSIS — M25519 Pain in unspecified shoulder: Secondary | ICD-10-CM

## 2013-01-10 MED ORDER — METAXALONE 800 MG PO TABS: 800.0000 mg | ORAL_TABLET | Freq: Three times a day (TID) | ORAL | Status: DC | PRN

## 2013-01-10 MED ORDER — MELOXICAM 15 MG PO TABS: 15.0000 mg | ORAL_TABLET | Freq: Every day | ORAL | Status: DC | PRN

## 2013-01-10 NOTE — Progress Notes (Signed)
Subjective:    Patient ID: Mark Jordan, male    DOB: [birth year], 72 y.o.   MRN: [id number]  HPI 72 y/o WM     01/10/2013 Acute OV  Complains of chronic pain in left shoulder pain that radiates into the chest w/ tightness . Very sore to move shoulder some days. Comes and goes. Can not tell when it is going to act up. Pt reports this initially began 3 yrs ago with shoulder injury requiring surgery replacement .  Does feel  symptoms have not worsened recently.  denies SOB, wheezing, cough, vision changes, difficulty swallowing Or arm weakness. Is left handed.  Working long hours lately.  Has used mobic and skelaxin in past but has not taken any for a while.  Patient denies any exertional chest pain, palpitations, dizziness, diaphoresis, jaw pain, shortness of breath..         Problem List:  ALLERGIC RHINITIS (ICD-477.9) - uses OTC meds Prn...  HYPERTENSION (ICD-401.9) - on LOTREL 10-20/d w/ good control of BP... ~  6/13:  on Lotrel10-20; BP= 142/90 & we reviewed the need for low sodium diet & home BP monitoring. ~  1/14:  on Lotrel10-20; BP= 142/78 & he denies CP, palpit, SOB, edema; we reviewed the need for low sodium diet & home BP monitoring. ~  5/14:  on Lotrel10-20; BP= 134/88 & he denies CP, palpit, SOB, edema; continue same for now...  CHEST PAIN, ATYPICAL (ICD-786.59) - hx CP and tachy palpit in 2003 w/ neg cardiac eval from [name]... on ASA 81mg /d, and asymptomatic currently. ~  NuclearStressTest 6/03 showed no infarct, ? mild ischemia inferiorly, EF= 57%... ~  [name] 6/03 was negative without stress induced wall motion abn... ~  12/11:  CP eval via ER w/ neg EKG, neg ENZ, & norm StressEcho; CXR showed norm heart size, clear lungs, NAD... ~  1/13:  He had f/u [name]> doing satis w/o angina, occas atypCP; no changes made.. ~  10/13: he had f/u [name]> long hx atypCP, neg stress echo 12/11, c/o shoulder & arm pain=> felt to be musculoskeletal... ~  EKG 10/13  showed SBrady, rate56, otherw wnl...  HYPERCHOLESTEROLEMIA (ICD-272.0) - on SIMVASTATIN + FISH OIL 1000mg /d... ~  FLP 3/09 showed TChol 157, TG 33, HDL 38, LDL 113... Imroved, continue same med + diet. ~  FLP 3/10 showed TChol 145, TG 32, HDL 40, LDL 99 ~  FLP 2/11 showed TChol 199, TG 34, HDL 54, LDL 139... rec> same med, better diet, get wt down. ~  FLP 11/11 showed TChol 168, TG 50, HDL 41, LDL 117 ~  FLP 5/12 on Simva40+FishOil showed TChol 181, TG 30, HDL 41, LDL 134... Discussed better diet. ~  FLP 11/12 on Simva40+FishOil showed TChol 79, TG 15, HDL 42, LDL 34... Hard to believe numbers on same med, and SGOT/ SGPT are elev also;  REC to HOLD Simva40 for 1 month & repeat FLP/ Liver panel at that time... ~  Repeat FLP 12/12 off Simva for 30d showed TChol 195, TG 35, HDL 44, LDL 144, and LFTs were back to normal; rec start Simva20. ~  FLP 6/13 on Simva20 showed TChol 170, TG 43, HDL 43, LDL 118... Keep same + better diet. ~  FLP 1/14 on Simva20 showed TChol 180, TG 56, HDL 39, LDL 130... We decided to incr Simva40 ~  FLP 5/14 on Simva40 showed TChol 180, TG 44, HDL 39, LDL 133  GERD (ICD-530.81)  PUD, HX OF (ICD-V12.71) - on PREVACID 30mg Bid now .Marland KitchenMarland Kitchen  followed by [facility] for [facility]. ~  EGD 2004 showed large HH & schatzki ring... ~  AbdSonar 10/04 was WNL... ~  EDG 1/10 repeated for dysphagia w/ HH, schatzki ring, erosive esophagitis...dilated & PPI incr to Bid... ~  [facility] Hepatobiliary scan 3/11 showed patent ducts, GB EF=31.5% (normal>30%), pt did have symptoms during the CCK infusion. ~  [name]'s note indicated surg consultation; pt indicates that they didn't feel he needed GB removed. ~  AbdSonar 11/12 showed normal liver, spleen, no ascites; norm GB x 3mm adherent ?stone vs polyp within, right renal cyst ~  [name]'s note from 3/13 indicated that he changed pt from Prev30Bid to Dexilant60 Qam; and taking "BiosLifeSlim" bid...  DIVERTICULOSIS OF COLON (ICD-562.10) and  HEMORRHOIDS COLONIC POLYPS (ICD-211.3) - GI = [name] and pt reports seen 3/09 w/ colonoscopy showing 4 polyps... f/u planned 72yrs. ~  adm to [name] 5/09 w/ lower GI bleed likely from [employer]- self-limited, didn't req Tx etc... ~  2/14: he had f/u colonoscopy by [name]> +divertics, hems, one polyp (?polypoid mucosa) f/u planned 72yrs... ~  4/14: he had hemorrhoid banding by [facility] w/ post banding bleed- now improved 7 on-going management by GI...  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 ~  pt reports PSA 3/10 was <2 ~  labs here 2/11 showed PSA= 1.13 ~  Labs 5/12 showed PSA= 1.67 ~  Labs 6/13 showed PSA= 0.95 ~  Labs 5/14 showed PSA= 1.03 (pt insisted on recheck PSA & is reassured).  OSTEOARTHRITIS & ORTHOPEDIC ISSUES: LOW BACK PAIN, CHRONIC (ICD-724.2) - he has a hx of LBP and FM-like symptoms... states doing well w/ exercise program. ~  8/11:  fall from bed of truck w/ left prox humerus fx & surg [name] (shoulder replacement)- lots of rehab but still has decr ROM shoulder. 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  ANXIETY (ICD-300.00) - takes ALPRAZOLAM 0.5mg  Prn...  Health Maintenance - up to date on colonoscopy and prostate evals... he had TETANUS shot 2003, and PNEUMOVAX at age 72 in 2003- repeat PNEUMOVAX given 2/11 at age 72... he gets yearly Flu shots as well...  Given TDAP 6/13...   Past Surgical History  Procedure Laterality Date  . Inguinal hernia repair      left  . Rotator cuff repair  5/08    left by Dr Rennis Chris  . 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 Leg surgery      Outpatient Encounter Prescriptions as of 01/10/2013  Medication Sig   . ALPRAZolam (XANAX) 0.5 MG tablet 1/2 - 1 tab by mouth three times daily as needed for nerves  . amLODipine-benazepril (LOTREL) 10-20 MG per capsule Take 1 capsule by mouth daily.  Marland Kitchen aspirin 81 MG tablet Take 81 mg by mouth daily.    . Azelastine-Fluticasone (DYMISTA) 137-50 MCG/ACT SUSP Place 2 puffs into the nose at bedtime.  . cyclobenzaprine (FLEXERIL) 5 MG tablet Take 1 tablet (5 mg total) by mouth 3 (three) times daily as needed for muscle spasms.  . lansoprazole (PREVACID) 30 MG capsule Take 1 capsule (30 mg total) by mouth daily.  . meclizine (ANTIVERT) 25 MG tablet  1/2 - 1 tab by mouth up to three times daily as needed for dizziness  . meloxicam (MOBIC) 15 MG tablet Take 1 tablet (15 mg total) by mouth daily as needed for pain.  . metaxalone (SKELAXIN) 800 MG tablet Take 800 mg by mouth 3 (three) times daily as needed.  . mupirocin ointment (BACTROBAN) 2 % APPLY AS DIRECTED.  Marland Kitchen simvastatin (ZOCOR) 40 MG tablet 1/2 tab by mouth at bedtime  . Tamsulosin HCl (FLOMAX) 0.4 MG CAPS Take 0.4 mg by mouth daily after supper.   Marland Kitchen HYDROcodone-homatropine (HYDROMET) 5-1.5 MG/5ML syrup 1-2 tsp every 4-6 hours as needed for cough.  May make you sleepy.    No Known Allergies   Current Medications, Allergies, Past Medical History, Past Surgical History, Family History, and Social History were reviewed in [employer] record.   Review of Systems         See HPI - all other systems neg except as noted... The patient complains of dyspnea on exertion.  The patient denies anorexia, fever, weight loss, weight gain, vision loss, decreased hearing, hoarseness, chest pain, syncope, peripheral edema, prolonged cough, headaches, hemoptysis, abdominal pain, melena, hematochezia, severe indigestion/heartburn, hematuria, incontinence, muscle weakness, suspicious skin lesions, transient blindness, difficulty walking, depression, unusual weight change, abnormal bleeding, enlarged lymph  nodes, and angioedema.   Objective:   Physical Exam      WD, WN, 72 y/o WM in NAD... GENERAL:  Alert & oriented; pleasant & cooperative... HEENT:  [place]/AT,   EACs-clear, TMs-wnl, NOSE-clear drainage , THROAT-clear drainage . NECK:  Supple w/ fairROM; no JVD; normal carotid impulses w/o bruits; no thyromegaly or nodules palpated; no lymphadenopathy. CHEST:  Clear to P & A; without wheezes/ rales/ or rhonchi heard...  HEART:  Regular Rhythm; without murmurs/ rubs/ or gallops detected... ABDOMEN:  Soft & nontender; normal bowel sounds; no organomegaly or masses palpated... EXT:  s/p left humerus surg, mild arthritic changes; no varicose veins/ venous insuffic/ or edema. Tender along left anterior shoulder. nml grips, decreased ROM on left  NEURO:   gait normal & balance OK. DERM:  No lesions noted     Assessment & Plan:

## 2013-01-10 NOTE — Assessment & Plan Note (Signed)
Chronic left shoulder pain s/p injury in 2011   Plan   Mobic 15mg  daily with food As needed  For shoulder and neck pain  Skelaxin 800mg  Three times a day  As needed  Muscle spasm.  Alternate ice and heat to back.   Please contact office for sooner follow up if symptoms do not improve or worsen or seek emergency care Follow up Dr. Kriste Basque  As planned and As needed

## 2013-01-10 NOTE — Patient Instructions (Addendum)
Mobic 15mg  daily with food As needed  For shoulder and neck pain  Skelaxin 800mg  Three times a day  As needed  Muscle spasm.  Alternate ice and heat to back.   Please contact office for sooner follow up if symptoms do not improve or worsen or seek emergency care Follow up Dr. Kriste Basque  As planned and As needed

## 2013-01-11 ENCOUNTER — Telehealth: Payer: Self-pay | Admitting: Adult Health

## 2013-01-11 NOTE — Telephone Encounter (Signed)
Received fax from [facility] stating that [employer] will not cover the Skelaxin 800mg  rx'd at yesterday's visit.    Called spoke with patient to discuss.  Per pt, he still has some of this medication from his last rx that are still within date.  Pt stated the PA can be disregarded.  Called [facility], spoke with pharm tech [place] and informed her of the above.  Baird Lyons verbalized her understanding; will sign off.

## 2013-01-29 ENCOUNTER — Telehealth: Payer: Self-pay | Admitting: Pulmonary Disease

## 2013-01-29 MED ORDER — TRAMADOL HCL 50 MG PO TABS: 50.0000 mg | ORAL_TABLET | Freq: Three times a day (TID) | ORAL | Status: DC | PRN

## 2013-01-29 NOTE — Telephone Encounter (Addendum)
Pt wife aware of recs per SN. Tramadol 50mg  #40 Take 1 tab TID PRN. Sent to [facility]. Aware to be eval at ED if CP becomes severe.  Pt wife is requesting an appt soon--I explained that he does not have any availabilities until March and she is requesting that they be fit in somewhere if possible. Will forward back to Leigh to keep an eye out for sooner appt.

## 2013-01-29 NOTE — Telephone Encounter (Signed)
Spoke with spouse. She reports pt has chest tx that goes from one side to the other off and on, both hands are swollen, arm feels numb at times, pt has to sleep sitting up bc his chest hurts, shoulder pain as well. Per spouse this has been going on for about 2 weeks. He has been taking aleve. She is requesting pt get in to see SN for appt. Please advise thanks  No Known Allergies   Current Outpatient Prescriptions on File Prior to Visit  Medication Sig Dispense Refill  . ALPRAZolam (XANAX) 0.5 MG tablet TAKE 1/2 TO 1 TABLET 3 TIMES DAILY AS NEEDED  90 tablet  5  . amLODipine-benazepril (LOTREL) 10-20 MG per capsule Take 1 capsule by mouth daily.  30 capsule  5  . aspirin 81 MG tablet Take 81 mg by mouth daily.        . Azelastine-Fluticasone (DYMISTA) 137-50 MCG/ACT SUSP Place 2 puffs into the nose at bedtime.  6 g  0  . cyclobenzaprine (FLEXERIL) 5 MG tablet Take 1 tablet (5 mg total) by mouth 3 (three) times daily as needed for muscle spasms.  30 tablet  1  . HYDROcodone-homatropine (HYDROMET) 5-1.5 MG/5ML syrup 1-2 tsp every 4-6 hours as needed for cough.  May make you sleepy.  240 mL  0  . lansoprazole (PREVACID) 30 MG capsule Take 1 capsule (30 mg total) by mouth daily.  30 capsule  5  . meclizine (ANTIVERT) 25 MG tablet 1/2 - 1 tab by mouth up to three times daily as needed for dizziness  90 tablet  5  . meloxicam (MOBIC) 15 MG tablet Take 1 tablet (15 mg total) by mouth daily as needed for pain.  30 tablet  3  . metaxalone (SKELAXIN) 800 MG tablet Take 1 tablet (800 mg total) by mouth 3 (three) times daily as needed.  30 tablet  3  . mupirocin ointment (BACTROBAN) 2 % APPLY AS DIRECTED.  22 g  1  . simvastatin (ZOCOR) 40 MG tablet 1/2 tab by mouth at bedtime      . Tamsulosin HCl (FLOMAX) 0.4 MG CAPS Take 0.4 mg by mouth daily after supper.        No current facility-administered medications on file prior to visit.

## 2013-01-29 NOTE — Telephone Encounter (Signed)
Per SN--  No openings Take the alprazolam TID for the "muscle spasms"  Tramadol 50 mg  TID as needed for pain Rest, heating pad, tylenol Will need eval in the ER is CP is severe.  thanks

## 2013-01-30 ENCOUNTER — Telehealth: Payer: Self-pay | Admitting: *Deleted

## 2013-01-30 DIAGNOSIS — N320 Bladder-neck obstruction: Secondary | ICD-10-CM

## 2013-01-30 DIAGNOSIS — K5730 Diverticulosis of large intestine without perforation or abscess without bleeding: Secondary | ICD-10-CM

## 2013-01-30 DIAGNOSIS — F411 Generalized anxiety disorder: Secondary | ICD-10-CM

## 2013-01-30 DIAGNOSIS — E7800 Pure hypercholesterolemia, unspecified: Secondary | ICD-10-CM

## 2013-01-30 DIAGNOSIS — I10 Essential (primary) hypertension: Secondary | ICD-10-CM

## 2013-01-30 NOTE — Telephone Encounter (Signed)
Spoke with pt spouse Requests pts labs be ordered so that pt can have labs done 1 week prior to upcoming appt with SN. Pt and wife come in at same time for appts.  Please advise SN what labs you would like pt to have. Thanks.

## 2013-02-02 NOTE — Telephone Encounter (Signed)
Labs are in the computer for the pt and i called and lmom to make his aware. Nothing further is needed.

## 2013-02-07 ENCOUNTER — Ambulatory Visit (INDEPENDENT_AMBULATORY_CARE_PROVIDER_SITE_OTHER): Payer: Medicare Other | Admitting: Adult Health

## 2013-02-07 ENCOUNTER — Other Ambulatory Visit (INDEPENDENT_AMBULATORY_CARE_PROVIDER_SITE_OTHER): Payer: Medicare Other

## 2013-02-07 ENCOUNTER — Encounter: Payer: Self-pay | Admitting: Adult Health

## 2013-02-07 VITALS — BP 132/78 | HR 65 | Temp 97.1°F | Ht 68.5 in | Wt 171.8 lb

## 2013-02-07 DIAGNOSIS — I10 Essential (primary) hypertension: Secondary | ICD-10-CM

## 2013-02-07 DIAGNOSIS — E7800 Pure hypercholesterolemia, unspecified: Secondary | ICD-10-CM

## 2013-02-07 DIAGNOSIS — M1990 Unspecified osteoarthritis, unspecified site: Secondary | ICD-10-CM

## 2013-02-07 DIAGNOSIS — K5730 Diverticulosis of large intestine without perforation or abscess without bleeding: Secondary | ICD-10-CM

## 2013-02-07 DIAGNOSIS — N320 Bladder-neck obstruction: Secondary | ICD-10-CM

## 2013-02-07 LAB — BASIC METABOLIC PANEL
BUN: 19 mg/dL (ref 6–23)
CHLORIDE: 107 meq/L (ref 96–112)
CO2: 29 mEq/L (ref 19–32)
CREATININE: 0.8 mg/dL (ref 0.4–1.5)
Calcium: 8.9 mg/dL (ref 8.4–10.5)
GFR: 106.4 mL/min (ref >60.00)
Glucose, Bld: 99 mg/dL (ref 70–99)
POTASSIUM: 4.6 meq/L (ref 3.5–5.1)
Sodium: 141 mEq/L (ref 135–145)

## 2013-02-07 LAB — PSA: PSA: 0.8 ng/mL (ref 0.10–4.00)

## 2013-02-07 LAB — CBC WITH DIFFERENTIAL/PLATELET
BASOS PCT: 0.3 % (ref 0.0–3.0)
Basophils Absolute: 0 10*3/uL (ref 0.0–0.1)
EOS ABS: 0.1 10*3/uL (ref 0.0–0.7)
EOS PCT: 1.3 % (ref 0.0–5.0)
HEMATOCRIT: 45.7 % (ref 39.0–52.0)
Hemoglobin: 15.4 g/dL (ref 13.0–17.0)
LYMPHS ABS: 1.6 10*3/uL (ref 0.7–4.0)
Lymphocytes Relative: 25.6 % (ref 12.0–46.0)
MCHC: 33.7 g/dL (ref 30.0–36.0)
MCV: 90 fl (ref 78.0–100.0)
MONO ABS: 0.6 10*3/uL (ref 0.1–1.0)
Monocytes Relative: 10.3 % (ref 3.0–12.0)
NEUTROS ABS: 3.8 10*3/uL (ref 1.4–7.7)
NEUTROS PCT: 62.5 % (ref 43.0–77.0)
Platelets: 190 10*3/uL (ref 150.0–400.0)
RBC: 5.08 Mil/uL (ref 4.22–5.81)
RDW: 13.9 % (ref 11.5–14.6)
WBC: 6.1 10*3/uL (ref 4.5–10.5)

## 2013-02-07 LAB — LIPID PANEL
CHOL/HDL RATIO: 6
CHOLESTEROL: 207 mg/dL — AB (ref 0–200)
HDL: 36 mg/dL — AB (ref >39.00)
Triglycerides: 66 mg/dL (ref 0.0–149.0)
VLDL: 13.2 mg/dL (ref 0.0–40.0)

## 2013-02-07 LAB — HEPATIC FUNCTION PANEL
ALT: 22 U/L (ref 0–53)
AST: 24 U/L (ref 0–37)
Albumin: 4 g/dL (ref 3.5–5.2)
Alkaline Phosphatase: 50 U/L (ref 39–117)
BILIRUBIN DIRECT: 0.1 mg/dL (ref 0.0–0.3)
BILIRUBIN TOTAL: 0.7 mg/dL (ref 0.3–1.2)
Total Protein: 6.5 g/dL (ref 6.0–8.3)

## 2013-02-07 LAB — LDL CHOLESTEROL, DIRECT: LDL DIRECT: 144.2 mg/dL

## 2013-02-07 NOTE — Progress Notes (Signed)
Subjective:    Patient ID: Mark Jordan, male    DOB: [birth year], 73 y.o.   MRN: [id number]  HPI 73 y/o WM     02/07/2013 Acute OV  Complains wrist joint discomfort and swelling-worse on the left worse than the right x2 weeks.  denies dyspnea, wheezing, chest tightness, chest discomfort Was seen last month for shoulder pain . rx Mobic and skelaxin . Took for ~1 week and shoulder got better but still sore on/off. Previous shoulder surgery on left. .  Says wrist feels stiff and sore first thing in am and at night , better once he gets up moving around. Notices hands are puffy . No known injury . No arm weakness, swollen glands. Feels good.   Had fasting labs this am for upcoming CPX this month         Problem List:  ALLERGIC RHINITIS (ICD-477.9) - uses OTC meds Prn...  HYPERTENSION (ICD-401.9) - on LOTREL 10-20/d w/ good control of BP... ~  6/13:  on Lotrel10-20; BP= 142/90 & we reviewed the need for low sodium diet & home BP monitoring. ~  1/14:  on Lotrel10-20; BP= 142/78 & he denies CP, palpit, SOB, edema; we reviewed the need for low sodium diet & home BP monitoring. ~  5/14:  on Lotrel10-20; BP= 134/88 & he denies CP, palpit, SOB, edema; continue same for now...  CHEST PAIN, ATYPICAL (ICD-786.59) - hx CP and tachy palpit in 2003 w/ neg cardiac eval from [name]... on ASA 81mg /d, and asymptomatic currently. ~  NuclearStressTest 6/03 showed no infarct, ? mild ischemia inferiorly, EF= 57%... ~  Leola Brazil 6/03 was negative without stress induced wall motion abn... ~  12/11:  CP eval via ER w/ neg EKG, neg ENZ, & norm StressEcho; CXR showed norm heart size, clear lungs, NAD... ~  1/13:  He had f/u [name]> doing satis w/o angina, occas atypCP; no changes made.. ~  10/13: he had f/u [name]> long hx atypCP, neg stress echo 12/11, c/o shoulder & arm pain=> felt to be musculoskeletal... ~  EKG 10/13 showed SBrady, rate56, otherw wnl...  HYPERCHOLESTEROLEMIA (ICD-272.0) - on  SIMVASTATIN + FISH OIL 1000mg /d... ~  [place] 3/09 showed TChol 157, TG 33, HDL 38, LDL 113... Imroved, continue same med + diet. ~  FLP 3/10 showed TChol 145, TG 32, HDL 40, LDL 99 ~  FLP 2/11 showed TChol 199, TG 34, HDL 54, LDL 139... rec> same med, better diet, get wt down. ~  FLP 11/11 showed TChol 168, TG 50, HDL 41, LDL 117 ~  FLP 5/12 on Simva40+FishOil showed TChol 181, TG 30, HDL 41, LDL 134... Discussed better diet. ~  FLP 11/12 on Simva40+FishOil showed TChol 79, TG 15, HDL 42, LDL 34... Hard to believe numbers on same med, and SGOT/ SGPT are elev also;  REC to [place] for 1 month & repeat FLP/ Liver panel at that time... ~  Repeat FLP 12/12 off Simva for 30d showed TChol 195, TG 35, HDL 44, LDL 144, and LFTs were back to normal; rec start Simva20. ~  FLP 6/13 on Simva20 showed TChol 170, TG 43, HDL 43, LDL 118... Keep same + better diet. ~  FLP 1/14 on Simva20 showed TChol 180, TG 56, HDL 39, LDL 130... We decided to incr Simva40 ~  FLP 5/14 on Simva40 showed TChol 180, TG 44, HDL 39, LDL 133  GERD (ICD-530.81)  PUD, HX OF (ICD-V12.71) - on PREVACID 30mg Bid now ... followed by [facility] for GI. ~  EGD 2004 showed large HH & schatzki ring... ~  AbdSonar 10/04 was WNL... ~  EDG 1/10 repeated for dysphagia w/ HH, schatzki ring, erosive esophagitis...dilated & PPI incr to Bid... ~  [facility] Hepatobiliary scan 3/11 showed patent ducts, GB EF=31.5% (normal>30%), pt did have symptoms during the CCK infusion. ~  [name]'s note indicated surg consultation; pt indicates that they didn't feel he needed GB removed. ~  AbdSonar 11/12 showed normal liver, spleen, no ascites; norm GB x 73mm adherent ?stone vs polyp within, right renal cyst ~  [name]'s note from 3/13 indicated that he changed pt from Prev30Bid to [place]; and taking "BiosLifeSlim" bid...  DIVERTICULOSIS OF COLON (ICD-562.10) and HEMORRHOIDS COLONIC POLYPS (ICD-211.3) - GI = [name] and pt reports seen 3/09 w/  colonoscopy showing 4 polyps... f/u planned 3yrs. ~  adm to [place] 5/09 w/ lower GI bleed likely from [employer]- self-limited, didn't req Tx etc... ~  2/14: he had f/u colonoscopy by [name]> +divertics, hems, one polyp (?polypoid mucosa) f/u planned 30yrs... ~  4/14: he had hemorrhoid banding by [facility] w/ post banding bleed- now improved 7 on-going management by GI...  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 ~  pt reports PSA 73/10 was <2 ~  labs here 2/11 showed PSA= 1.13 ~  Labs 5/12 showed PSA= 1.67 ~  Labs 6/13 showed PSA= 0.95 ~  Labs 5/14 showed PSA= 1.03 (pt insisted on recheck PSA & is reassured).  OSTEOARTHRITIS & ORTHOPEDIC ISSUES: LOW BACK PAIN, CHRONIC (ICD-724.2) - he has a hx of LBP and FM-like symptoms... states doing well w/ exercise program. ~  8/11:  fall from bed of truck w/ left prox humerus fx & surg [name] (shoulder replacement)- lots of rehab but still has decr ROM shoulder. 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  ANXIETY (ICD-300.00) - takes ALPRAZOLAM 0.5mg  Prn...  Health Maintenance - up to date on colonoscopy and prostate evals... he had TETANUS shot 2003, and PNEUMOVAX at age 73 in 2003- repeat PNEUMOVAX given 2/11 at age 73... he gets yearly Flu shots as well...  Given TDAP 6/13...   Past Surgical History  Procedure Laterality Date  . Inguinal hernia repair      left  . Rotator cuff repair  5/08    left by Dr Onnie Graham  . 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 Leg surgery      Outpatient Encounter Prescriptions as of 02/07/2013  Medication Sig  . lansoprazole (PREVACID) 30 MG capsule Take 1 capsule (30 mg total) by mouth daily.   . simvastatin (ZOCOR) 40 MG tablet 1/2 tab by mouth at bedtime  . Tamsulosin HCl (FLOMAX) 0.4 MG CAPS Take 0.4 mg by mouth daily after supper.   . traMADol (ULTRAM) 50 MG tablet Take 1 tablet (50 mg total) by mouth 3 (three) times daily as needed for moderate pain or severe pain.  Marland Kitchen ALPRAZolam (XANAX) 0.5 MG tablet TAKE 1/2 TO 1 TABLET 3 TIMES DAILY AS NEEDED  . amLODipine-benazepril (LOTREL) 10-20 MG per capsule Take 1 capsule by mouth daily.  Marland Kitchen aspirin 81 MG tablet Take 81 mg by mouth daily.    Marland Kitchen  Azelastine-Fluticasone (DYMISTA) 137-50 MCG/ACT SUSP Place 2 puffs into the nose at bedtime.  . cyclobenzaprine (FLEXERIL) 5 MG tablet Take 1 tablet (5 mg total) by mouth 3 (three) times daily as needed for muscle spasms.  Marland Kitchen HYDROcodone-homatropine (HYDROMET) 5-1.5 MG/5ML syrup 1-2 tsp every 4-6 hours as needed for cough.  May make you sleepy.  . meclizine (ANTIVERT) 25 MG tablet 1/2 - 1 tab by mouth up to three times daily as needed for dizziness  . meloxicam (MOBIC) 15 MG tablet Take 1 tablet (15 mg total) by mouth daily as needed for pain.  . metaxalone (SKELAXIN) 800 MG tablet Take 1 tablet (800 mg total) by mouth 3 (three) times daily as needed.  . mupirocin ointment (BACTROBAN) 2 % APPLY AS DIRECTED.    No Known Allergies   Current Medications, Allergies, Past Medical History, Past Surgical History, Family History, and Social History were reviewed in [employer] record.   Review of Systems         See HPI - all other systems neg except as noted... The patient complains of dyspnea on exertion.  The patient denies anorexia, fever, weight loss, weight gain, vision loss, decreased hearing, hoarseness, chest pain, syncope, peripheral edema, prolonged cough, headaches, hemoptysis, abdominal pain, melena, hematochezia, severe indigestion/heartburn, hematuria, incontinence, muscle weakness, suspicious skin lesions, transient blindness, difficulty walking, depression, unusual  weight change, abnormal bleeding, enlarged lymph nodes, and angioedema.   Objective:   Physical Exam      WD, WN, 73 y/o WM in NAD... GENERAL:  Alert & oriented; pleasant & cooperative... HEENT:  [place]/AT,   EACs-clear, TMs-wnl, NOSE-clear drainage , THROAT-clear drainage . NECK:  Supple w/ fairROM; no JVD; normal carotid impulses w/o bruits; no thyromegaly or nodules palpated; no lymphadenopathy. CHEST:  Clear to P & A; without wheezes/ rales/ or rhonchi heard...no axillary adneopathy noted.   HEART:  Regular Rhythm; without murmurs/ rubs/ or gallops detected... ABDOMEN:  Soft & nontender; normal bowel sounds; no organomegaly or masses palpated... EXT:  s/p left humerus surg,  arthritic changes; no varicose veins/ venous insuffic/ or edema. Tender along left anterior shoulder. nml grips, decreased ROM on left (prev shoulder surgery) , tender along bilateral wrist, nml grips , mild swelling along hands, no deformity.  Significant DJD changes of hands.  Pulses intact. No adenopathy noted  NEURO:   gait normal & balance OK. DERM:  No lesions noted     Assessment & Plan:

## 2013-02-07 NOTE — Patient Instructions (Addendum)
Mobic 15mg  daily with food As needed  For shoulder and neck pain  Alternate ice and heat to wrist /hands  May be good to see Dr. Onnie Graham regarding your shoulder issues  Please contact office for sooner follow up if symptoms do not improve or worsen or seek emergency care Follow up Dr. Lenna Gilford  As planned and As needed

## 2013-02-07 NOTE — Assessment & Plan Note (Signed)
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 Onnie Graham regarding your shoulder issues  Please contact office for sooner follow up if symptoms do not improve or worsen or seek emergency care Follow up Dr. Lenna Gilford  As planned and As needed

## 2013-02-09 ENCOUNTER — Ambulatory Visit: Payer: Medicare Other | Admitting: Adult Health

## 2013-02-16 ENCOUNTER — Telehealth: Payer: Self-pay | Admitting: Pulmonary Disease

## 2013-02-16 NOTE — Telephone Encounter (Signed)
Called and spoke with pts wife and she is aware of appt scheduled for the pt on Friday for the pt.  She is aware to cont to do recs from TP.  To call for further recs.

## 2013-02-23 ENCOUNTER — Other Ambulatory Visit (INDEPENDENT_AMBULATORY_CARE_PROVIDER_SITE_OTHER): Payer: Medicare Other

## 2013-02-23 ENCOUNTER — Ambulatory Visit (INDEPENDENT_AMBULATORY_CARE_PROVIDER_SITE_OTHER): Payer: Medicare Other | Admitting: Pulmonary Disease

## 2013-02-23 ENCOUNTER — Encounter: Payer: Self-pay | Admitting: Pulmonary Disease

## 2013-02-23 ENCOUNTER — Ambulatory Visit (INDEPENDENT_AMBULATORY_CARE_PROVIDER_SITE_OTHER)
Admission: RE | Admit: 2013-02-23 | Discharge: 2013-02-23 | Disposition: A | Discharge status: Home / Self Care | Payer: Medicare Other | Source: Ambulatory Visit | Attending: Pulmonary Disease | Admitting: Pulmonary Disease

## 2013-02-23 VITALS — BP 122/76 | HR 69 | Temp 98.7°F | Ht 68.5 in | Wt 167.0 lb

## 2013-02-23 DIAGNOSIS — R0789 Other chest pain: Secondary | ICD-10-CM

## 2013-02-23 DIAGNOSIS — M129 Arthropathy, unspecified: Secondary | ICD-10-CM

## 2013-02-23 DIAGNOSIS — M1990 Unspecified osteoarthritis, unspecified site: Secondary | ICD-10-CM

## 2013-02-23 DIAGNOSIS — I10 Essential (primary) hypertension: Secondary | ICD-10-CM

## 2013-02-23 DIAGNOSIS — N401 Enlarged prostate with lower urinary tract symptoms: Secondary | ICD-10-CM

## 2013-02-23 DIAGNOSIS — K219 Gastro-esophageal reflux disease without esophagitis: Secondary | ICD-10-CM

## 2013-02-23 DIAGNOSIS — M545 Low back pain, unspecified: Secondary | ICD-10-CM

## 2013-02-23 DIAGNOSIS — D126 Benign neoplasm of colon, unspecified: Secondary | ICD-10-CM

## 2013-02-23 DIAGNOSIS — N138 Other obstructive and reflux uropathy: Secondary | ICD-10-CM

## 2013-02-23 DIAGNOSIS — K5730 Diverticulosis of large intestine without perforation or abscess without bleeding: Secondary | ICD-10-CM

## 2013-02-23 DIAGNOSIS — N200 Calculus of kidney: Secondary | ICD-10-CM

## 2013-02-23 DIAGNOSIS — F411 Generalized anxiety disorder: Secondary | ICD-10-CM

## 2013-02-23 DIAGNOSIS — E7800 Pure hypercholesterolemia, unspecified: Secondary | ICD-10-CM

## 2013-02-23 LAB — SEDIMENTATION RATE: Sed Rate: 8 mm/hr (ref 0–22)

## 2013-02-23 LAB — C-REACTIVE PROTEIN: CRP: 0.6 mg/dL (ref 0.5–20.0)

## 2013-02-23 LAB — URIC ACID: Uric Acid, Serum: 4.7 mg/dL (ref 4.0–7.8)

## 2013-02-23 MED ORDER — LANSOPRAZOLE 30 MG PO CPDR: 30.0000 mg | DELAYED_RELEASE_CAPSULE | Freq: Every day | ORAL | Status: DC

## 2013-02-23 MED ORDER — METHYLPREDNISOLONE (PAK) 4 MG PO TABS: ORAL_TABLET | ORAL | Status: DC

## 2013-02-23 MED ORDER — METHYLPREDNISOLONE ACETATE 80 MG/ML IJ SUSP
80.0000 mg | Freq: Once | INTRAMUSCULAR | Status: AC
  Administered 2013-02-23: 80 mg via INTRAMUSCULAR

## 2013-02-23 NOTE — Progress Notes (Signed)
Subjective:    Patient ID: Mark Jordan, male    DOB: 1940-09-06, 73 y.o.   MRN: [id number]  HPI 73 y/o WM here for a follow up visit... he has mult med probs as noted below...   ~  July 21, 2011:  40mo ROV & Mark Jordan has had a good interval w/o new complaints or concerns; he tells me he is not taking Mobic or Vicodin 7 just uses Tylenol for discomfort- we updated his EPIC med list accordingly... We gave him the TDAP vaccination today... HBP> on Lotrel10-20; BP= 142/90 & we reviewed the need for low sodium diet & home BP monitoring... 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 Chol> on Simva40- taking 1/2 daily; FLP showed TChol 170, TG 43, HDL 43, LDL 118; decided on same med, better diet... GI> GERD, HxPUD, Divertics, Polyps> followed by [facility] & he remains on Prev30Bid; pt didn't mention Dexilant or BiosLifeSlim today... GU> Hx KidStone, BPH> followed by [facility] on Flomax0.4; PSA is wnl at 0.95... Ortho> DJD, LBP, left shoulder injury> followed by [name] but not on any prescription meds- just Tylenol etc OTC...  Anxiety> on Xanax0.5mg  prn...    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 We reviewed the following medical problems during today's office visit >>     HBP> on Lotrel10-20; BP= 142/78 & he denies CP, palpit, SOB, edema; we reviewed the need for low sodium diet & home BP monitoring...    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    Chol> on Simva40- taking 1/2 daily; FLP showed TChol 180, TG 56, HDL 39, LDL 130; decided to incr Simva40/d & better diet...    GI> GERD, HxPUD, Divertics, Polyps> followed by [facility] & he remains on Prev30Bid; pt didn't mention Dexilant or BiosLifeSlim today...    GU> Hx KidStone, BPH> followed by [facility] on Flomax0.4; PSA is wnl at 0.95...    Ortho> DJD, LBP, left shoulder injury> followed by [name] but not on any prescription meds- just Tylenol etc OTC...     Anxiety> on Xanax0.5mg  prn... 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 LABS 1/14:  FLP- not at goals on Simva20 w/ LDL=130 (Rec incr 40mg /d);  Chems- wnl...  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  We reviewed the following medical problems during today's office visit >>  HBP> on Lotrel10-20; BP= 134/88 & he denies CP, palpit, SOB, edema; we reviewed the need for low sodium diet & home BP monitoring...    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    Chol> on Simva40 (?taking 1/2); FLP showed TChol 180, TG 44, HDL 39, LDL 133; needs better diet, incr exercise, take whole pill 40mg /d...    GI> GERD, HxPUD, Divertics, Polyps, Hems> followed by [facility] & he remains on Prev30/d; had colonoscopy 2/14> +divertics, hems, one polyp removed (?polypoid mucosa) f/u planned 93yrs; subseq Hem banding...    GU> Hx KidStone, BPH> followed by [facility] on Flomax0.4; seen recently & pt reports doing well- no note, he wants PSA checked 5/14= 1.03 & he is  reassured...    Ortho> DJD, LBP, left shoulder injury> followed by [name] on Mobic15, Skelaxin, Flexeril...    Anxiety> on Xanax0.5mg  prn... We reviewed prob list, meds, xrays and labs> see below for updates >>  LABS 5/14:  FLP- on Simva40 (?1/2 tab) x LDL=133;  Chems- wnl;  CBC- wnl;  TSH=1.54;  PSA=1.03 & he is reassured...  ~  February 23, 2013:  58mo ROV & add-on appt for bilat hand and wrist swelling L>R ~82mo duration; min pain, min tender on palp, sl decr grip strength, etc; he has hx fall from truck 2011 w/ left humerus fx & subseq shoulder replacement surg [name], and hx DDD in CSpine treated w/ Skelaxin & Mobic; he denies systemic symptoms (fever, feeling bad, cough, hemoptysis, swelling in neck area, etc);  Exam shows both hands & wrists swollen, min tender, sl decr grips, no adenopathy, etc;  We decided to evaluate w/ CXR, EKG (his request concerned due to pos FamHx CHF), & Labs- collagen vasc screen;  Rx w/ Depo80 & Medrol dosepak in the interim...    We reviewed prob list, meds, xrays and labs> see below for updates >>  CXR 1/15 showed norm heart size, clear lungs, left shoulder prosthesis... EKG 1/15 showed NSR, rate66, WNL, NAD & no change from old tracings... LABS 1/15:  FLP- not at goals on Simva20;  Chems- wnl;  CBC- wnl;  PSA=0.80... LABS 1/15:  Sed=8  CRP=0.6  ANA=neg  RA=11(neg<14)  Anti-CCP<2.0 (neg<5.0)  Uric=4.7  ADDENDUM>> pt called 02/28/13 states not feeling right w/ "pains around the chest area", hands and wrists are better after Medrol Rx; we discussed CT Angio to r/o PE, aneurysm, etc (NOTE: he has a long hx atypCP & followed by Hilary Hertz). CT Angio 03/01/13 showed neg for PE, norm heart size, clear lungs, no adenopathy; and incidental findings include HH, & prev left shoulder prosthesis...          Problem List:  ALLERGIC RHINITIS (ICD-477.9) - uses OTC meds Prn...  HYPERTENSION (ICD-401.9) - on LOTREL 10-20/d w/ good control of BP... ~  6/13:  on Lotrel10-20;  BP= 142/90 & we reviewed the need for low sodium diet & home BP monitoring. ~  1/14:  on Lotrel10-20; BP= 142/78 & he denies CP, palpit, SOB, edema; we reviewed the need for low sodium diet & home BP monitoring. ~  5/14:  on Lotrel10-20; BP= 134/88 & he denies CP, palpit, SOB, edema; continue same for now... ~  1/15:  on Lotrel10-20; BP= 122/76 & he remains largely asymptomatic...  CHEST PAIN, ATYPICAL (ICD-786.59) - hx CP and tachy palpit in 2003 w/ neg cardiac eval from [name]... on ASA 81mg /d, and asymptomatic currently. ~  NuclearStressTest 6/03  showed no infarct, ? mild ischemia inferiorly, EF= 57%... ~  [place] 6/03 was negative without stress induced wall motion abn... ~  12/11:  CP eval via ER w/ neg EKG, neg ENZ, & norm StressEcho; CXR showed norm heart size, clear lungs, NAD... ~  1/13:  He had f/u [name]> doing satis w/o angina, occas atypCP; no changes made.. ~  10/13: he had f/u [name]> long hx atypCP, neg stress echo 12/11, c/o shoulder & arm pain=> felt to be musculoskeletal... ~  EKG 10/13 showed SBrady, rate56, otherw wnl... ~  1/15:  on ASA81;  pt called 02/28/13- not feeling right w/ "pains around the chest area"; we discussed CT Angio=> neg for PE and norm heart size, clear lungs, no adenopathy; incidental HH, left shoulder prosthesis; Note- he has a long hx atypCP & followed by [name] for Cards, he wants f/u appt.  HYPERCHOLESTEROLEMIA (ICD-272.0) - on SIMVASTATIN + FISH OIL 1000mg /d... ~  [place] 3/09 showed TChol 157, TG 33, HDL 38, LDL 113... Imroved, continue same med + diet. ~  FLP 3/10 showed TChol 145, TG 32, HDL 40, LDL 99 ~  FLP 2/11 showed TChol 199, TG 34, HDL 54, LDL 139... rec> same med, better diet, get wt down. ~  FLP 11/11 showed TChol 168, TG 50, HDL 41, LDL 117 ~  FLP 5/12 on Simva40+FishOil showed TChol 181, TG 30, HDL 41, LDL 134... Discussed better diet. ~  FLP 11/12 on Simva40+FishOil showed TChol 79, TG 15, HDL 42, LDL 34... Hard to  believe numbers on same med, and SGOT/ SGPT are elev also;  REC to [facility] for 1 month & repeat FLP/ Liver panel at that time... ~  Repeat FLP 12/12 off Simva for 30d showed TChol 195, TG 35, HDL 44, LDL 144, and LFTs were back to normal; rec start Simva20. ~  FLP 6/13 on Simva20 showed TChol 170, TG 43, HDL 43, LDL 118... Keep same + better diet. ~  FLP 1/14 on Simva20 showed TChol 180, TG 56, HDL 39, LDL 130... We decided to incr Simva40 ~  FLP 5/14 on Simva40 (?taking 1/2) showed TChol 180, TG 44, HDL 39, LDL 133... Needs to take whole pill. ~  FLP 1/15 on Simva40 (?taking 1/2) showed TChol 207, TG 66, HDL 36, LDL 144... Needs to take 40mg /d.  GERD (ICD-530.81)  PUD, HX OF (ICD-V12.71) - on PREVACID 30mg Bid now ... followed by [facility] for GI. ~  EGD 2004 showed large HH & schatzki ring... ~  AbdSonar 10/04 was WNL... ~  EDG 1/10 repeated for dysphagia w/ HH, schatzki ring, erosive esophagitis...dilated & PPI incr to Bid... ~  [facility] Hepatobiliary scan 3/11 showed patent ducts, GB EF=31.5% (normal>30%), pt did have symptoms during the CCK infusion. ~  [name]'s note indicated surg consultation; pt indicates that they didn't feel he needed GB removed. ~  AbdSonar 11/12 showed normal liver, spleen, no ascites; norm GB x 15mm adherent ?stone vs polyp within, right renal cyst ~  [name]'s note from 3/13 indicated that he changed pt from Prev30Bid to [place]; and taking "BiosLifeSlim" bid... ~  He reports taking Prevacid 30mg /d now & doing satis...  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 f/u planned 37yrs. ~  adm to [place] 5/09 w/ lower GI bleed likely from Divertics- self-limited, didn't req Tx etc... ~  2/14: he had f/u colonoscopy by [name]> +divertics, hems, one polyp (?  polypoid mucosa) f/u planned 71yrs... ~  4/14: he had hemorrhoid banding by [facility] w/  post banding bleed- now improved 7 on-going management by GI...  Hx of CALCULUS, KIDNEY (ICD-592.0) - followed by [id number] & he  also does his PSA's. ~  pt reports PSA 3/10 was <2 ~  labs here 2/11 showed PSA= 1.13 ~  Labs 5/12 showed PSA= 1.67 ~  Labs 6/13 showed PSA= 0.95 ~  Labs 5/14 showed PSA= 1.03 (pt insisted on recheck PSA & is reassured). ~  Labs 1/15 showed PSA= 0.80  OSTEOARTHRITIS & ORTHOPEDIC ISSUES: LOW BACK PAIN, CHRONIC (ICD-724.2) - he has a hx of LBP and FM-like symptoms... states doing well w/ exercise program. ~  8/11:  fall from bed of truck w/ left prox humerus fx & surg [name] (shoulder replacement)- lots of rehab but still has decr ROM shoulder. 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 ~  1/15: he presented w/ swelling of both hands and wrists ?etiology> collagen vasc screen was neg, symptoms resolved w/ Depo/Medrol...  ANXIETY (ICD-300.00) - takes ALPRAZOLAM 0.5mg  Prn...  Health Maintenance - up to date on colonoscopy and prostate evals... he had TETANUS shot 2003, and PNEUMOVAX at age 57 in 2003- repeat PNEUMOVAX given 2/11 at age 67... he gets yearly Flu shots as well...  Given TDAP 6/13...   Past Surgical History  Procedure Laterality Date  . Inguinal hernia repair      left  . Rotator cuff repair  5/08    left by Dr Onnie Graham  . 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 Leg surgery      Outpatient Encounter Prescriptions as of 02/23/2013  Medication Sig  . ALPRAZolam (XANAX) 0.5 MG tablet TAKE 1/2 TO 1 TABLET 3 TIMES DAILY AS NEEDED  . amLODipine-benazepril (LOTREL) 10-20 MG per capsule Take 1 capsule by mouth daily.  Marland Kitchen aspirin 81 MG tablet Take 81 mg by mouth daily.    .  lansoprazole (PREVACID) 30 MG capsule Take 1 capsule (30 mg total) by mouth daily.  . meclizine (ANTIVERT) 25 MG tablet 1/2 - 1 tab by mouth up to three times daily as needed for dizziness  . meloxicam (MOBIC) 15 MG tablet Take 1 tablet (15 mg total) by mouth daily as needed for pain.  . simvastatin (ZOCOR) 40 MG tablet 1/2 tab by mouth at bedtime  . Tamsulosin HCl (FLOMAX) 0.4 MG CAPS Take 0.4 mg by mouth daily after supper.   . traMADol (ULTRAM) 50 MG tablet Take 1 tablet (50 mg total) by mouth 3 (three) times daily as needed for moderate pain or severe pain.  . Azelastine-Fluticasone (DYMISTA) 137-50 MCG/ACT SUSP Place 2 puffs into the nose at bedtime.  . cyclobenzaprine (FLEXERIL) 5 MG tablet Take 1 tablet (5 mg total) by mouth 3 (three) times daily as needed for muscle spasms.  Marland Kitchen HYDROcodone-homatropine (HYDROMET) 5-1.5 MG/5ML syrup 1-2 tsp every 4-6 hours as needed for cough.  May make you sleepy.  . metaxalone (SKELAXIN) 800 MG tablet Take 1 tablet (800 mg total) by mouth 3 (three) times daily as needed.  . mupirocin ointment (BACTROBAN) 2 % APPLY AS DIRECTED.    No Known Allergies  Current Medications, Allergies, Past Medical History, Past Surgical History, Family History, and Social History were reviewed in [employer] record.   Review of Systems         See HPI - all other systems neg except as noted... The patient complains of dyspnea on exertion.  The patient denies anorexia, fever, weight loss, weight gain, vision loss, decreased hearing, hoarseness, chest pain, syncope, peripheral edema, prolonged cough, headaches, hemoptysis, abdominal pain, melena, hematochezia, severe indigestion/heartburn, hematuria, incontinence, muscle weakness, suspicious skin lesions, transient blindness, difficulty walking, depression, unusual weight change, abnormal bleeding, enlarged lymph nodes, and angioedema.   Objective:   Physical Exam      WD, WN, 73 y/o WM in  [place]... GENERAL:  Alert & oriented; pleasant & cooperative... HEENT:  [place]/AT, EOM-full, PERRLA, EACs-clear, TMs-wnl, NOSE-clear, THROAT-clear & wnl. NECK:  Supple w/ fairROM; no JVD; normal carotid impulses w/o bruits; no thyromegaly or nodules palpated; no lymphadenopathy. CHEST:  Clear to P & A; without wheezes/ rales/ or rhonchi heard...  HEART:  Regular Rhythm; without murmurs/ rubs/ or gallops detected... ABDOMEN:  Soft & nontender; normal bowel sounds; no organomegaly or masses palpated... EXT:  s/p left humerus surg, mild arthritic changes; no varicose veins/ venous insuffic/ or edema. NEURO:  CN's intact; motor testing normal; sensory testing normal; gait normal & balance OK. DERM:  No lesions noted; periumbil rash improved...  RADIOLOGY DATA:  Reviewed in the EPIC EMR & discussed w/ the patient...  LABORATORY DATA:  Reviewed in the EPIC EMR & discussed w/ the patient...   Assessment & Plan:    Swelling in hands & wrist 1/15>> neg collagen vasc screen and symptoms resolved w/ brief course of Medrol...   HBP>  Controlled on Lotrel + diet/ exercise, reminded to elim salt etc...  Hx of CP>  On ASA, neg eval 2011 by [name] & f/u 2013; he is quite anxious w/ call-back about chest discomfort, subseq neg CTA; he wants to f/u w/ Cards...  CHOL>  On Simva40-?1/2 now + diet efforts w/ FLP ok x LDL=144; rec incr Simva40 & recheck...  GI> HxPUD, GERD, Divertics, Polyps>  Followed by [facility] & his notes from Feb-Apr 2014 are reviewed; continue current meds...  ORTHO>  DJD, LBP, etc>  He is still doing his home therapy for the left shoulder injury; c/o incr pain in neck=> left arm; XRays w/ DDD & we will Rx w/ Mobic, Skelaxin, rest, heat & refer if symptoms persist.  ANXIETY>  Continue Alpraz & consider more regular dosing due to his anxiety...   Patient's Medications  New Prescriptions   METHYLPREDNISOLONE (MEDROL DOSPACK) 4 MG TABLET    follow package directions  Previous  Medications   ALPRAZOLAM (XANAX) 0.5 MG TABLET    TAKE 1/2 TO 1 TABLET 3 TIMES DAILY AS NEEDED   AMLODIPINE-BENAZEPRIL (LOTREL) 10-20 MG PER CAPSULE    Take 1 capsule by mouth daily.   ASPIRIN 81 MG TABLET    Take 81 mg by mouth daily.     AZELASTINE-FLUTICASONE (DYMISTA) 137-50 MCG/ACT SUSP    Place 2 puffs into the nose at bedtime.   CYCLOBENZAPRINE (FLEXERIL) 5 MG TABLET    Take 1 tablet (5 mg total) by mouth 3 (three) times daily as needed for muscle spasms.   HYDROCODONE-HOMATROPINE (HYDROMET) 5-1.5 MG/5ML SYRUP    1-2 tsp every 4-6 hours as needed for cough.  May make you sleepy.   MECLIZINE (ANTIVERT) 25 MG TABLET    1/2 - 1 tab by  mouth up to three times daily as needed for dizziness   MELOXICAM (MOBIC) 15 MG TABLET    Take 1 tablet (15 mg total) by mouth daily as needed for pain.   METAXALONE (SKELAXIN) 800 MG TABLET    Take 1 tablet (800 mg total) by mouth 3 (three) times daily as needed.   MUPIROCIN OINTMENT (BACTROBAN) 2 %    APPLY AS DIRECTED.   SIMVASTATIN (ZOCOR) 40 MG TABLET    1/2 tab by mouth at bedtime   TAMSULOSIN HCL (FLOMAX) 0.4 MG CAPS    Take 0.4 mg by mouth daily after supper.    TRAMADOL (ULTRAM) 50 MG TABLET    Take 1 tablet (50 mg total) by mouth 3 (three) times daily as needed for moderate pain or severe pain.  Modified Medications   Modified Medication Previous Medication   LANSOPRAZOLE (PREVACID) 30 MG CAPSULE lansoprazole (PREVACID) 30 MG capsule      Take 1 capsule (30 mg total) by mouth daily.    Take 1 capsule (30 mg total) by mouth daily.  Discontinued Medications   No medications on file

## 2013-02-23 NOTE — Patient Instructions (Signed)
Today we updated your med list in our EPIC system...    Continue your current medications the same...  Today we did your follow up CXR, EKG, and some additional blood work for further evaluation of the swelling & discomfort in your hands...    We will contact you w/ the results when available...   In the meanwhile we will treat you w/ a Depo shot & a Medrol Dosepak for the inflammation...    We will want to know how your symptoms respond to this treatment...  Call for any questions.Marland KitchenMarland Kitchen

## 2013-02-24 LAB — RHEUMATOID FACTOR: RHEUMATOID FACTOR: 11 [IU]/mL (ref <=14)

## 2013-02-26 LAB — ANA: Anti Nuclear Antibody(ANA): NEGATIVE

## 2013-02-26 LAB — CYCLIC CITRUL PEPTIDE ANTIBODY, IGG

## 2013-02-28 ENCOUNTER — Telehealth: Payer: Self-pay | Admitting: Pulmonary Disease

## 2013-02-28 DIAGNOSIS — R0789 Other chest pain: Secondary | ICD-10-CM

## 2013-02-28 NOTE — Telephone Encounter (Signed)
Called and spoke with pt and he was calling back to see about the ct angio that Sn wanted to have scheduled.  i advised the pt that our [facility] have to call and verify with his insurance company to get this approved and then they will be able to schedule this appt. Pt is aware that as soon as they get the approval and scheduled they will call him with this appt.  Pt voiced his understanding.

## 2013-02-28 NOTE — Telephone Encounter (Signed)
Pt seen by SN on 1.23.15 by SN for follow up: Patient Instructions     Today we updated your med list in our EPIC system...  Continue your current medications the same...  Today we did your follow up CXR, EKG, and some additional blood work for further evaluation of the swelling & discomfort in your hands...  We will contact you w/ the results when available...  In the meanwhile we will treat you w/ a Depo shot & a Medrol Dosepak for the inflammation...  We will want to know how your symptoms respond to this treatment...  Call for any questions...   Labs, EKG and cxr done at ov: all results WNL.  Per SN: can do a CT Angio if symptoms persist.  Schedule CT Angio Chest for persistent chest pain, if results negative will need cardiac consult.  Order placed.  Pt okay with this and is aware that he will be called with scheduling details.

## 2013-03-01 ENCOUNTER — Ambulatory Visit (INDEPENDENT_AMBULATORY_CARE_PROVIDER_SITE_OTHER)
Admission: RE | Admit: 2013-03-01 | Discharge: 2013-03-01 | Disposition: A | Discharge status: Home / Self Care | Payer: Medicare Other | Source: Ambulatory Visit | Attending: Pulmonary Disease | Admitting: Pulmonary Disease

## 2013-03-01 ENCOUNTER — Ambulatory Visit: Payer: Medicare Other | Admitting: Pulmonary Disease

## 2013-03-01 ENCOUNTER — Other Ambulatory Visit: Payer: Medicare Other

## 2013-03-01 DIAGNOSIS — R0789 Other chest pain: Secondary | ICD-10-CM

## 2013-03-01 MED ORDER — IOHEXOL 350 MG/ML SOLN
80.0000 mL | Freq: Once | INTRAVENOUS | Status: AC | PRN
  Administered 2013-03-01: 80 mL via INTRAVENOUS

## 2013-03-02 ENCOUNTER — Telehealth: Payer: Self-pay | Admitting: Pulmonary Disease

## 2013-03-02 NOTE — Telephone Encounter (Signed)
Pt is requesting his CT angio results. Results printed with phone note. Please advise SN thanks

## 2013-03-02 NOTE — Telephone Encounter (Signed)
Called and spoke with pt and he is aware of ct angio results per SN.   Pt voiced his understanding and nothing further is needed.

## 2013-03-05 ENCOUNTER — Inpatient Hospital Stay: Admission: RE | Admit: 2013-03-05 | Payer: Medicare Other | Source: Ambulatory Visit

## 2013-03-19 ENCOUNTER — Telehealth: Payer: Self-pay | Admitting: Pulmonary Disease

## 2013-03-19 DIAGNOSIS — R51 Headache: Secondary | ICD-10-CM

## 2013-03-19 NOTE — Telephone Encounter (Signed)
Spoke with pt's wife - She reports pt c/o head feels crazy all the time - Diff sleeping - Denies headaches, dizziness or blurred vision - Pt's wife sees Dr Jannifer Franklin, Neurologist and she wanders if pt may need to see him.  Please advise

## 2013-03-19 NOTE — Telephone Encounter (Signed)
lmomtcb x1 

## 2013-03-19 NOTE — Telephone Encounter (Signed)
Per SN --  Can refer pt to Dr. Jannifer Franklin. Pt's wife see him as well.  Pt's wife is aware that referral will be made. Nothing further is needed.

## 2013-03-24 ENCOUNTER — Emergency Department (HOSPITAL_COMMUNITY): Payer: Medicare Other

## 2013-03-24 ENCOUNTER — Encounter (HOSPITAL_COMMUNITY): Payer: Self-pay | Admitting: Emergency Medicine

## 2013-03-24 ENCOUNTER — Emergency Department (HOSPITAL_COMMUNITY)
Admission: EM | Admit: 2013-03-24 | Discharge: 2013-03-24 | Disposition: A | Discharge status: Home / Self Care | Payer: Medicare Other | Attending: Emergency Medicine | Admitting: Emergency Medicine

## 2013-03-24 DIAGNOSIS — K219 Gastro-esophageal reflux disease without esophagitis: Secondary | ICD-10-CM | POA: Insufficient documentation

## 2013-03-24 DIAGNOSIS — I10 Essential (primary) hypertension: Secondary | ICD-10-CM | POA: Insufficient documentation

## 2013-03-24 DIAGNOSIS — E7800 Pure hypercholesterolemia, unspecified: Secondary | ICD-10-CM | POA: Insufficient documentation

## 2013-03-24 DIAGNOSIS — G8929 Other chronic pain: Secondary | ICD-10-CM | POA: Insufficient documentation

## 2013-03-24 DIAGNOSIS — Z7982 Long term (current) use of aspirin: Secondary | ICD-10-CM | POA: Insufficient documentation

## 2013-03-24 DIAGNOSIS — F411 Generalized anxiety disorder: Secondary | ICD-10-CM | POA: Insufficient documentation

## 2013-03-24 DIAGNOSIS — Z8711 Personal history of peptic ulcer disease: Secondary | ICD-10-CM | POA: Insufficient documentation

## 2013-03-24 DIAGNOSIS — R079 Chest pain, unspecified: Secondary | ICD-10-CM

## 2013-03-24 DIAGNOSIS — Z87442 Personal history of urinary calculi: Secondary | ICD-10-CM | POA: Insufficient documentation

## 2013-03-24 DIAGNOSIS — R0789 Other chest pain: Secondary | ICD-10-CM | POA: Insufficient documentation

## 2013-03-24 DIAGNOSIS — Z79899 Other long term (current) drug therapy: Secondary | ICD-10-CM | POA: Insufficient documentation

## 2013-03-24 LAB — CBC
HCT: 43.9 % (ref 39.0–52.0)
HEMOGLOBIN: 14.8 g/dL (ref 13.0–17.0)
MCH: 31.3 pg (ref 26.0–34.0)
MCHC: 33.7 g/dL (ref 30.0–36.0)
MCV: 92.8 fL (ref 78.0–100.0)
Platelets: 183 10*3/uL (ref 150–400)
RBC: 4.73 MIL/uL (ref 4.22–5.81)
RDW: 14.1 % (ref 11.5–15.5)
WBC: 5.4 10*3/uL (ref 4.0–10.5)

## 2013-03-24 LAB — BASIC METABOLIC PANEL
BUN: 22 mg/dL (ref 6–23)
CHLORIDE: 103 meq/L (ref 96–112)
CO2: 26 mEq/L (ref 19–32)
Calcium: 9 mg/dL (ref 8.4–10.5)
Creatinine, Ser: 0.72 mg/dL (ref 0.50–1.35)
GFR, EST NON AFRICAN AMERICAN: 90 mL/min — AB (ref >90)
GLUCOSE: 109 mg/dL — AB (ref 70–99)
POTASSIUM: 3.9 meq/L (ref 3.7–5.3)
SODIUM: 141 meq/L (ref 137–147)

## 2013-03-24 LAB — TROPONIN I: Troponin I: <0.3 ng/mL (ref <0.30)

## 2013-03-24 MED ORDER — ASPIRIN 81 MG PO CHEW
324.0000 mg | CHEWABLE_TABLET | Freq: Once | ORAL | Status: AC
  Administered 2013-03-24: 324 mg via ORAL
  Filled 2013-03-24: qty 4

## 2013-03-24 MED ORDER — ACETAMINOPHEN 500 MG PO TABS: 1000.0000 mg | ORAL_TABLET | Freq: Once | ORAL | Status: DC

## 2013-03-24 MED ORDER — SODIUM CHLORIDE 0.9 % IV BOLUS (SEPSIS): 1000.0000 mL | Freq: Once | INTRAVENOUS | Status: DC

## 2013-03-24 NOTE — ED Notes (Signed)
Pt returned from [facility], pt put back on cardiac monitor.

## 2013-03-24 NOTE — ED Provider Notes (Signed)
Care transferred from Dr. Reather Converse on 73 y.o. with intermittent CP, mild constant ache fat home for 7hrs.   Delta trop negative.  CP free during ED stay.  Discussed close outpt cardioogy f/u vs admission for observation and pt prefers d/c with close cardiology f/u.  He is being fitted for a holter monitor in 3 days.  I have also asked him to call his primary cardiologist.  Return precautions given for new or worsening symptoms including return of CP.   1. Chest pain         Neta Ehlers, MD 03/24/13 [id number]

## 2013-03-24 NOTE — ED Notes (Addendum)
Pt coming from home. Pt reports that he began having a dull generalized  chest pain last night. Pt denies SOB. Pt reports hx of intermittent chest pain. Pt alert x 4. NAD at this time. Pt also reported that he feels like he wakes up periodically and his heart is racing.

## 2013-03-24 NOTE — ED Provider Notes (Signed)
CSN: [id number]     Arrival date & time 03/24/13  0506 History   First MD Initiated Contact with Patient 03/24/13 [phone]     Chief Complaint  Patient presents with  . Chest Pain     (Consider location/radiation/quality/duration/timing/severity/associated sxs/prior Treatment) HPI Comments: 73 yo male with chol, htn, kidney stone, gerd, atypical cp hx presents after episode of mild anterior chest ache from 10 pm until 330 am.  Constant, similar to previous episodes.  Pt has had other chest pain, burning, intermittent from left shoulder surgery for 3 yrs.  He has seen pcp, cardiology, has had stress test 3-4 yrs ago okay per pt and recent CT chest that was okay.  No cath hx.  No MI or stent hx.  Non radiating.  No pain in ED.  No diaphoresis or exertional sxs.   Patient is a 73 y.o. male presenting with chest pain. The history is provided by the patient.  Chest Pain Pain location:  Substernal area Associated symptoms: no abdominal pain, no back pain, no diaphoresis, no fever, no headache, no shortness of breath and not vomiting     Past Medical History  Diagnosis Date  . Allergic rhinitis   . HTN (hypertension)   . Hypercholesterolemia   . GERD (gastroesophageal reflux disease)   . PUD (peptic ulcer disease)   . Diverticulosis of colon   . Kidney calculus   . Chronic low back pain   . Anxiety    Past Surgical History  Procedure Laterality Date  . Inguinal hernia repair      left  . Rotator cuff repair  5/08    left by Dr Onnie Graham  . 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 Leg surgery     Family History  Problem Relation Age of Onset  . Heart failure Father   . Pneumonia Mother    History  Substance Use Topics  . Smoking status: Never Smoker   . Smokeless tobacco: Never Used  . Alcohol Use: No    Review of Systems  Constitutional: Negative for fever, chills and diaphoresis.  HENT: Negative for congestion.   Eyes: Negative for visual  disturbance.  Respiratory: Negative for shortness of breath.   Cardiovascular: Positive for chest pain.  Gastrointestinal: Negative for vomiting and abdominal pain.  Genitourinary: Negative for dysuria and flank pain.  Musculoskeletal: Negative for back pain, neck pain and neck stiffness.  Skin: Negative for rash.  Neurological: Negative for light-headedness and headaches.      Allergies  Review of patient's allergies indicates no known allergies.  Home Medications   Current Outpatient Rx  Name  Route  Sig  Dispense  Refill  . ALPRAZolam (XANAX) 0.5 MG tablet      TAKE 1/2 TO 1 TABLET 3 TIMES DAILY AS NEEDED   90 tablet   5   . amLODipine-benazepril (LOTREL) 10-20 MG per capsule   Oral   Take 1 capsule by mouth daily.   30 capsule   5   . aspirin 81 MG tablet   Oral   Take 81 mg by mouth daily.           . Azelastine-Fluticasone (DYMISTA) 137-50 MCG/ACT SUSP   Nasal   Place 2 puffs into the nose at bedtime.   6 g   0   . cyclobenzaprine (FLEXERIL) 5 MG tablet   Oral   Take 1 tablet (5 mg total) by mouth 3 (three) times  daily as needed for muscle spasms.   30 tablet   1   . HYDROcodone-homatropine (HYDROMET) 5-1.5 MG/5ML syrup      1-2 tsp every 4-6 hours as needed for cough.  May make you sleepy.   240 mL   0   . lansoprazole (PREVACID) 30 MG capsule   Oral   Take 1 capsule (30 mg total) by mouth daily.   90 capsule   3   . meclizine (ANTIVERT) 25 MG tablet      1/2 - 1 tab by mouth up to three times daily as needed for dizziness   90 tablet   5   . meloxicam (MOBIC) 15 MG tablet   Oral   Take 1 tablet (15 mg total) by mouth daily as needed for pain.   30 tablet   3   . metaxalone (SKELAXIN) 800 MG tablet   Oral   Take 1 tablet (800 mg total) by mouth 3 (three) times daily as needed.   30 tablet   3   . methylPREDNIsolone (MEDROL DOSPACK) 4 MG tablet      follow package directions   21 tablet   0   . mupirocin ointment (BACTROBAN) 2  %      APPLY AS DIRECTED.   22 g   1   . simvastatin (ZOCOR) 40 MG tablet      1/2 tab by mouth at bedtime         . Tamsulosin HCl (FLOMAX) 0.4 MG CAPS   Oral   Take 0.4 mg by mouth daily after supper.          . traMADol (ULTRAM) 50 MG tablet   Oral   Take 1 tablet (50 mg total) by mouth 3 (three) times daily as needed for moderate pain or severe pain.   40 tablet   0    BP 130/82  Temp(Src) 98.2 F (36.8 C) (Oral)  Resp 23  Ht 5\' 9"  (1.753 m)  Wt 160 lb (72.576 kg)  BMI 23.62 kg/m2  SpO2 95% Physical Exam  Nursing note and vitals reviewed. Constitutional: He is oriented to person, place, and time. He appears well-developed and well-nourished.  HENT:  Head: Normocephalic and atraumatic.  Eyes: Conjunctivae are normal. Right eye exhibits no discharge. Left eye exhibits no discharge.  Neck: Normal range of motion. Neck supple. No tracheal deviation present.  Cardiovascular: Normal rate and regular rhythm.   No murmur heard. Pulmonary/Chest: Effort normal and breath sounds normal.  Abdominal: Soft. He exhibits no distension. There is no tenderness. There is no guarding.  Musculoskeletal: He exhibits no edema and no tenderness.  Neurological: He is alert and oriented to person, place, and time.  Skin: Skin is warm. No rash noted.  Psychiatric: He has a normal mood and affect.    ED Course  Procedures (including critical care time) Labs Review Labs Reviewed  BASIC METABOLIC PANEL - Abnormal; Notable for the following:    Glucose, Bld 109 (*)    GFR calc non Af Amer 90 (*)    All other components within normal limits  TROPONIN I  CBC  TROPONIN I   Imaging Review Dg Chest 2 View  03/24/2013   CLINICAL DATA:  Chest pain with intermittent tachycardia.  EXAM: CHEST  2 VIEW  COMPARISON:  CT ANGIO CHEST W/CM &/OR WO/CM dated 03/01/2013; DG CHEST PORTABLE dated 03/01/2013  FINDINGS: Cardiac silhouette is unremarkable. Tortuous aorta, mediastinal silhouette is  otherwise unremarkable. No pleural effusions  or focal consolidations. No pneumothorax. Left humeral arthroplasty. Very mild degenerative change of the thoracic spine.  IMPRESSION: No acute cardiopulmonary process.   Electronically Signed   By: Elon Alas   On: 03/24/2013 06:15    EKG Interpretation    Date/Time:  Saturday March 24 2013 05:14:27 EST Ventricular Rate:  66 PR Interval:  167 QRS Duration: 73 QT Interval:  400 QTC Calculation: 419 R Axis:   64 Text Interpretation:  Sinus rhythm Abnormal R-wave progression, early transition No acute findings, similar to previous Confirmed by Sagal Gayton  MD, Channell Quattrone ([id number]) on 03/24/2013 5:49:51 AM            MDM   Final diagnoses:  None    Atypical CP, no cp since 330 am, recurrent cp hx has seen cardiology, pt has been told not cardiac. With age, risk factors, cardiac work up in ED. No concern for dissection, no pain in ED, no blood clot risk factors. ASA given. HEART score of 4, no pain in ED, pt has close fup outpt.   Rechecked, well appearing. Long discussion with pt/ wife, pt has had similar pain in the past, he understands this still may be CAD.   I do not think this is dissection or ulcer (he had recent normal scope).    Plan for delta troponin. Offered observation for serial troponins and likely stress test, pt prefers outpt fup.  He understands he can change his mind at any time.  Signed out to finish dispo.  Chest pain acute, HTN   Mariea Clonts, MD 03/24/13 [phone]

## 2013-03-24 NOTE — Discharge Instructions (Signed)
Take aspirin daily until cleared by cardiology.  If you were given medicines take as directed.  If you are on coumadin or contraceptives realize their levels and effectiveness is altered by many different medicines.  If you have any reaction (rash, tongues swelling, other) to the medicines stop taking and see a physician.   Please follow up as directed and return to the ER or see a physician for new or worsening symptoms.  Thank you.  Chest Pain (Nonspecific) It is often hard to give a specific diagnosis for the cause of chest pain. There is always a chance that your pain could be related to something serious, such as a heart attack or a blood clot in the lungs. You need to follow up with your caregiver for further evaluation. CAUSES   Heartburn.  Pneumonia or bronchitis.  Anxiety or stress.  Inflammation around your heart (pericarditis) or lung (pleuritis or pleurisy).  A blood clot in the lung.  A collapsed lung (pneumothorax). It can develop suddenly on its own (spontaneous pneumothorax) or from injury (trauma) to the chest.  Shingles infection (herpes zoster virus). The chest wall is composed of bones, muscles, and cartilage. Any of these can be the source of the pain.  The bones can be bruised by injury.  The muscles or cartilage can be strained by coughing or overwork.  The cartilage can be affected by inflammation and become sore (costochondritis). DIAGNOSIS  Lab tests or other studies, such as X-rays, electrocardiography, stress testing, or cardiac imaging, may be needed to find the cause of your pain.  TREATMENT   Treatment depends on what may be causing your chest pain. Treatment may include:  Acid blockers for heartburn.  Anti-inflammatory medicine.  Pain medicine for inflammatory conditions.  Antibiotics if an infection is present.  You may be advised to change lifestyle habits. This includes stopping smoking and avoiding alcohol, caffeine, and chocolate.  You  may be advised to keep your head raised (elevated) when sleeping. This reduces the chance of acid going backward from your stomach into your esophagus.  Most of the time, nonspecific chest pain will improve within 2 to 3 days with rest and mild pain medicine. HOME CARE INSTRUCTIONS   If antibiotics were prescribed, take your antibiotics as directed. Finish them even if you start to feel better.  For the next few days, avoid physical activities that bring on chest pain. Continue physical activities as directed.  Do not smoke.  Avoid drinking alcohol.  Only take over-the-counter or prescription medicine for pain, discomfort, or fever as directed by your caregiver.  Follow your caregiver's suggestions for further testing if your chest pain does not go away.  Keep any follow-up appointments you made. If you do not go to an appointment, you could develop lasting (chronic) problems with pain. If there is any problem keeping an appointment, you must call to reschedule. SEEK MEDICAL CARE IF:   You think you are having problems from the medicine you are taking. Read your medicine instructions carefully.  Your chest pain does not go away, even after treatment.  You develop a rash with blisters on your chest. SEEK IMMEDIATE MEDICAL CARE IF:   You have increased chest pain or pain that spreads to your arm, neck, jaw, back, or abdomen.  You develop shortness of breath, an increasing cough, or you are coughing up blood.  You have severe back or abdominal pain, feel nauseous, or vomit.  You develop severe weakness, fainting, or chills.  You have  a fever. THIS IS AN EMERGENCY. Do not wait to see if the pain will go away. Get medical help at once. Call your local emergency services (911 in [place] Do not drive yourself to the hospital. MAKE SURE YOU:   Understand these instructions.  Will watch your condition.  Will get help right away if you are not doing well or get worse. Document  Released: 10/28/2004 Document Revised: 04/12/2011 Document Reviewed: 08/24/2007 [facility] Patient Information 2014 [place].

## 2013-03-24 NOTE — ED Notes (Signed)
Patient transported to [facility] 

## 2013-04-04 ENCOUNTER — Telehealth: Payer: Self-pay | Admitting: Pulmonary Disease

## 2013-04-04 DIAGNOSIS — M25519 Pain in unspecified shoulder: Secondary | ICD-10-CM

## 2013-04-04 NOTE — Telephone Encounter (Signed)
Called spoke with daughter. Pt saw ortho yesterday and Dr. Roberts Gaudy was suppose to fax over his recommendations. She wants to know if this was received and what SN recommends? Please advise thanks

## 2013-04-04 NOTE — Telephone Encounter (Signed)
Per SN---  SN does not do pain management.   RECS to refer to pain management clinic---ortho could have set this up for the pt.     SN will be retiring from primary care practice as of April 1, so we will need to set the pt up with a new primary care doctor.  thanks

## 2013-04-04 NOTE — Telephone Encounter (Signed)
Daughter aware of recs. Referral placed. Pt already has appt pending appt with new PCP.

## 2013-04-09 ENCOUNTER — Telehealth: Payer: Self-pay | Admitting: Pulmonary Disease

## 2013-04-09 NOTE — Telephone Encounter (Signed)
Spoke with pt's duaghter and explained that referral has been made to [facility] pain center and that they will give them a call when appt is made.   Also gave her the phone number for pain clinic if she wants to contact them regarding appt.

## 2013-04-13 ENCOUNTER — Ambulatory Visit: Payer: Medicare Other | Admitting: Neurology

## 2013-04-13 ENCOUNTER — Ambulatory Visit (INDEPENDENT_AMBULATORY_CARE_PROVIDER_SITE_OTHER): Payer: Medicare Other | Admitting: Neurology

## 2013-04-13 ENCOUNTER — Encounter: Payer: Self-pay | Admitting: Neurology

## 2013-04-13 VITALS — BP 130/80 | HR 68 | Temp 98.0°F | Resp 20 | Ht 69.0 in | Wt 161.9 lb

## 2013-04-13 DIAGNOSIS — R42 Dizziness and giddiness: Secondary | ICD-10-CM

## 2013-04-13 DIAGNOSIS — R55 Syncope and collapse: Secondary | ICD-10-CM

## 2013-04-13 DIAGNOSIS — M5416 Radiculopathy, lumbar region: Secondary | ICD-10-CM

## 2013-04-13 DIAGNOSIS — IMO0002 Reserved for concepts with insufficient information to code with codable children: Secondary | ICD-10-CM

## 2013-04-13 NOTE — Progress Notes (Signed)
NEUROLOGY CONSULTATION NOTE  Mark Jordan MRN: [id number] DOB: [birth year]  Referring provider: Dr. Lenna Gilford Primary care provider: Dr. Lenna Gilford  Reason for consult:  dizziness  HISTORY OF PRESENT ILLNESS: Mark Jordan is a 73 year old right-handed man with hearing loss, hypercholesterolemia, hypertension, kidney stones, DDD, BPH, GERD, PUD with erosive esophagitis and hemorrhage, and anxiety who presents for dizziness.  He is accompanied by his wife.  Records and images were personally reviewed where available.    Symptoms have been ongoing for a few months.  He reports episodes of lightheadedness, like he is going to pass out.  There is no spinning sensation.  There is no accompanying palpitations, diaphoresis, nausea, headache, slurred speech, perioral numbness or focal weakness.  It is not positional.  It comes on spontaneously, such as just sitting in a chair, and lasts about 3-4 minutes.  There is no associated gait problems or tinnitus.  He does have longstanding history of hearing loss.  Over the past couple of months, he has had a significant workup addressing both dizziness and chest pain.  Patient has history of left-sided burning chest pain and dizziness since left shoulder surgery 3 years ago.  He has difficulty raising his left arm now.  In January 2015, he again endorsed episodes of mild anterior chest pain without shortness of breath and palpitations and underwent a workup.  EKG and CXR were unremarkable.  He had a CTA of the chest on 02/28/13, which did not reveal any evidence for pulmonary embolus.  He presented to the ED at [facility].  Workup was unremarkable.  He was found to be dehydrated and was treated with IV fluids.  He presented to the ED on 03/24/13 for a similar episode.  Troponins were negative.  CXR was unremarkable.  EKG revealed sinus rhythm without any evidence for acute cardiac process.  He had been taking Mobic for inflammatory joint pain.  He had seen Dr.  Richmond Campbell, who performed an endoscopy, which looked fine.  Holter monitor results are pending.  Since being treated for dehydration, he began drinking more fluids, including Gatorade.  He does feel better and the dizzy spells improved, but they are still present.  They occur about once a week.  He also reports that he occasionally gets a numbness in his left foot, that radiates up the lateral aspect of his leg.  There is no associated back or leg pain or leg weakness.  It occurs when he is standing up from a sitting position.  PAST MEDICAL HISTORY: Past Medical History  Diagnosis Date  . Allergic rhinitis   . HTN (hypertension)   . Hypercholesterolemia   . GERD (gastroesophageal reflux disease)   . PUD (peptic ulcer disease)   . Diverticulosis of colon   . Kidney calculus   . Chronic low back pain   . Anxiety     PAST SURGICAL HISTORY: Past Surgical History  Procedure Laterality Date  . Inguinal hernia repair      left  . Rotator cuff repair  5/08    left by Dr Onnie Graham  . 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 Leg surgery      MEDICATIONS: Current Outpatient Prescriptions on File Prior to Visit  Medication Sig Dispense Refill  . ALPRAZolam (XANAX) 0.5 MG tablet Take 0.25-0.5 mg by mouth 3 (three) times daily as needed for anxiety.      Marland Kitchen amLODipine-benazepril (LOTREL) 10-20 MG per  capsule Take 1 capsule by mouth daily.  30 capsule  5  . lansoprazole (PREVACID) 30 MG capsule Take 1 capsule (30 mg total) by mouth daily.  90 capsule  3  . omega-3 acid ethyl esters (LOVAZA) 1 G capsule Take 1 g by mouth daily.      . simvastatin (ZOCOR) 40 MG tablet Take 20 mg by mouth at bedtime.      . Tamsulosin HCl (FLOMAX) 0.4 MG CAPS Take 0.4 mg by mouth daily after supper.        No current facility-administered medications on file prior to visit.    ALLERGIES: No Known Allergies  FAMILY HISTORY: Family History  Problem Relation Age of Onset  .  Heart failure Father   . Pneumonia Mother     SOCIAL HISTORY: History   Social History  . Marital Status: Married    Spouse Name: Mark Jordan    Number of Children: 3  . Years of Education: N/A   Occupational History  . OWNER    Social History Main Topics  . Smoking status: Never Smoker   . Smokeless tobacco: Never Used  . Alcohol Use: No  . Drug Use: No  . Sexual Activity: Yes    Partners: Female   Other Topics Concern  . Not on file   Social History Narrative   Does not exercise   1 cup coffee a day   4 siblings all in good health          REVIEW OF SYSTEMS: Constitutional: No fevers, chills, or sweats, no generalized fatigue, change in appetite Eyes: No visual changes, double vision, eye pain Ear, nose and throat: No hearing loss, ear pain, nasal congestion, sore throat Cardiovascular: No chest pain, palpitations Respiratory:  No shortness of breath at rest or with exertion, wheezes GastrointestinaI: No nausea, vomiting, diarrhea, abdominal pain, fecal incontinence Genitourinary:  No dysuria, urinary retention or frequency Musculoskeletal:  No neck pain, back pain Integumentary: No rash, pruritus, skin lesions Neurological: as above Psychiatric: No depression, insomnia, anxiety Endocrine: No palpitations, fatigue, diaphoresis, mood swings, change in appetite, change in weight, increased thirst Hematologic/Lymphatic:  No anemia, purpura, petechiae. Allergic/Immunologic: no itchy/runny eyes, nasal congestion, recent allergic reactions, rashes  PHYSICAL EXAM: Filed Vitals:   04/13/13 0823  BP: 140/90  Pulse: 68  Temp: 98 F (36.7 C)  Resp: 20   General: No acute distress Head:  Normocephalic/atraumatic Neck: supple, no paraspinal tenderness, full range of motion Back: No paraspinal tenderness Heart: regular rate and rhythm Lungs: Clear to auscultation bilaterally. Vascular: No carotid bruits. Neurological Exam: Mental status: alert and oriented to  person, place, and time, speech fluent and not dysarthric, language intact. Cranial nerves: CN I: not tested CN II: pupils equal, round and reactive to light, visual fields intact, fundi unremarkable. CN III, IV, VI:  full range of motion, no nystagmus, no ptosis CN V: facial sensation intact CN VII: upper and lower face symmetric CN VIII: hearing intact CN IX, X: gag intact, uvula midline CN XI: sternocleidomastoid and trapezius muscles intact CN XII: tongue midline Bulk & Tone: normal, no fasciculations. Motor: cannot abduct left shoulder fully, but strength intact (5/5) Sensation: temperature and vibration intact. Deep Tendon Reflexes: 2+ throughout, toes down Finger to nose testing: no dysmetria Heel to shin: no dysmetria Gait: normal stance and stride.  Able to turn and walk in tandem. Romberg negative.  IMPRESSION: Lightheadedness.  Not true vertigo. Left leg numbness.  The lightheadedness is likely due to dehydration.  The numbness in the left leg likely is related to a lumbar radiculopathy.  However, given that he still has these dizzy spells, cardiac workup is unremarkable, I would still like to rule out a basilar stenosis.  PLAN: 1.  We will get CTA of the head to look at the basilar artery for sake of completeness. 2.  I wouldn't pursue further testing or treatment of the lumbar radiculopathy because there is no pain or weakness. 3.  Further management or testing if CTA is positive.  45 minute spent with patient, over 50% spent counseling and coordinating care.  Thank you for allowing me to take part in the care of this patient.  Shon MilletAdam Jaffe, DO  CC:  Alroy DustScott Nadel, MD

## 2013-04-13 NOTE — Patient Instructions (Addendum)
I think it is most likely due to dehydration.  But we will check a cat scan of the arteries in the head to look for any blockage.  The numbness in the leg is likely a pinched nerve in the back.  Since it doesn't hurt or cause weakness, I wouldn't do anything.  I will contact you with results. CTA Head  [facility] 04/19/13 8:15  (NOTHING to eat or drink 4 hours prior to test )

## 2013-04-19 ENCOUNTER — Encounter: Payer: Self-pay | Admitting: Physical Medicine & Rehabilitation

## 2013-04-19 ENCOUNTER — Ambulatory Visit (HOSPITAL_COMMUNITY)
Admission: RE | Admit: 2013-04-19 | Discharge: 2013-04-19 | Disposition: A | Discharge status: Home / Self Care | Payer: Medicare Other | Source: Ambulatory Visit | Attending: Neurology | Admitting: Neurology

## 2013-04-19 DIAGNOSIS — R42 Dizziness and giddiness: Secondary | ICD-10-CM | POA: Insufficient documentation

## 2013-04-19 DIAGNOSIS — R55 Syncope and collapse: Secondary | ICD-10-CM

## 2013-04-19 MED ORDER — IOHEXOL 350 MG/ML SOLN
50.0000 mL | Freq: Once | INTRAVENOUS | Status: AC | PRN
  Administered 2013-04-19: 50 mL via INTRAVENOUS

## 2013-04-23 ENCOUNTER — Ambulatory Visit: Payer: Medicare Other | Admitting: Neurology

## 2013-04-30 ENCOUNTER — Telehealth: Payer: Self-pay | Admitting: *Deleted

## 2013-04-30 NOTE — Telephone Encounter (Signed)
Patient is aware CTA looks normal

## 2013-05-24 ENCOUNTER — Ambulatory Visit: Payer: Medicare Other | Admitting: Physical Medicine & Rehabilitation

## 2013-06-01 ENCOUNTER — Ambulatory Visit (HOSPITAL_BASED_OUTPATIENT_CLINIC_OR_DEPARTMENT_OTHER): Payer: Medicare Other | Admitting: Physical Medicine & Rehabilitation

## 2013-06-01 ENCOUNTER — Encounter: Payer: Self-pay | Admitting: Physical Medicine & Rehabilitation

## 2013-06-01 ENCOUNTER — Encounter: Payer: Medicare Other | Attending: Physical Medicine & Rehabilitation

## 2013-06-01 VITALS — BP 131/79 | HR 68 | Resp 15 | Ht 69.0 in | Wt 165.0 lb

## 2013-06-01 DIAGNOSIS — I10 Essential (primary) hypertension: Secondary | ICD-10-CM | POA: Insufficient documentation

## 2013-06-01 DIAGNOSIS — M19031 Primary osteoarthritis, right wrist: Secondary | ICD-10-CM | POA: Insufficient documentation

## 2013-06-01 DIAGNOSIS — K219 Gastro-esophageal reflux disease without esophagitis: Secondary | ICD-10-CM | POA: Insufficient documentation

## 2013-06-01 DIAGNOSIS — M19039 Primary osteoarthritis, unspecified wrist: Secondary | ICD-10-CM | POA: Insufficient documentation

## 2013-06-01 DIAGNOSIS — G8928 Other chronic postprocedural pain: Secondary | ICD-10-CM | POA: Insufficient documentation

## 2013-06-01 DIAGNOSIS — M7500 Adhesive capsulitis of unspecified shoulder: Secondary | ICD-10-CM | POA: Insufficient documentation

## 2013-06-01 DIAGNOSIS — E7800 Pure hypercholesterolemia, unspecified: Secondary | ICD-10-CM | POA: Insufficient documentation

## 2013-06-01 MED ORDER — GABAPENTIN 300 MG PO CAPS: 300.0000 mg | ORAL_CAPSULE | Freq: Every day | ORAL | Status: DC

## 2013-06-01 NOTE — Patient Instructions (Signed)
Wrist splint for Right wrist 24/7 for 1 wk if wrist still swollen replace splint and call for injection

## 2013-06-01 NOTE — Progress Notes (Signed)
Subjective:    Patient ID: Mark Jordan, male    DOB: [birth year], 73 y.o.   MRN: [id number]  HPI Hx of fall 2011,  with left shoulder fracture, left sided chest pain.  Has been evaluated by ED and cardiology, no evidence of cardiac abnormality.  Seen by GI, Upper endoscopy normal.  Seen by orthopedic surgeon who did surgery.  Had 2 months of therapy post op.  Intermittent burning sensation, night time pain is worst Pain interferes with sleep. Overall pain has improved over the last month or so. Received gabapentin as well as Skelaxin from a walk-in clinic about 6 weeks ago. It was a 1 month supply which she has finished about 2 weeks ago , Pain has worsened since running out of medicines  Was told by orthopedist that new bone is growing around the shoulder joint  Secondary complaint of right wrist swelling he has had some pain with this as well. Arthritis panel ordered by primary care was negative. He's had no trauma to that area.   Pain Inventory Average Pain 9 Pain Right Now 3 My pain is burning  In the last 24 hours, has pain interfered with the following? General activity 0 Relation with others 0 Enjoyment of life 0 What TIME of day is your pain at its worst? night Sleep (in general) Poor  Pain is worse with: na Pain improves with: random Relief from Meds: 7  Mobility walk without assistance ability to climb steps?  yes do you drive?  yes  Function employed # of hrs/week 45  Neuro/Psych No problems in this area  Prior Studies Any changes since last visit?  no  Physicians involved in your care Any changes since last visit?  no   Family History  Problem Relation Age of Onset  . Heart failure Father   . Pneumonia Mother    History   Social History  . Marital Status: Married    Spouse Name: Dalton Molesworth    Number of Children: 3  . Years of Education: N/A   Occupational History  . OWNER    Social History Main Topics  . Smoking status: Never Smoker    . Smokeless tobacco: Never Used  . Alcohol Use: No  . Drug Use: No  . Sexual Activity: Yes    Partners: Female   Other Topics Concern  . None   Social History Narrative   Does not exercise   1 cup coffee a day   4 siblings all in good health         Past Surgical History  Procedure Laterality Date  . Inguinal hernia repair      left  . Rotator cuff repair  5/08    left by Dr Onnie Graham  . 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 Leg surgery     Past Medical History  Diagnosis Date  . Allergic rhinitis   . HTN (hypertension)   . Hypercholesterolemia   . GERD (gastroesophageal reflux disease)   . PUD (peptic ulcer disease)   . Diverticulosis of colon   . Kidney calculus   . Chronic low back pain   . Anxiety    BP 131/79  Pulse 68  Resp 15  Ht 5\' 9"  (1.753 m)  Wt 165 lb (74.844 kg)  BMI 24.36 kg/m2  SpO2 93%  Opioid Risk Score:   Fall Risk Score:     Review of Systems  All other  systems reviewed and are negative.      Objective:   Physical Exam  Nursing note and vitals reviewed. Constitutional: He is oriented to person, place, and time. He appears well-developed and well-nourished.  HENT:  Head: Normocephalic and atraumatic.  Eyes: Conjunctivae and EOM are normal. Pupils are equal, round, and reactive to light.  Neck: Normal range of motion.  Pulmonary/Chest:    Tenderness along the left sternal border Mild paresthesias over the surgical incision No evidence of pectoralis atrophy  Musculoskeletal:       Left shoulder: He exhibits decreased range of motion.       Right wrist: He exhibits decreased range of motion and effusion. He exhibits no bony tenderness, no crepitus and no deformity.  Left shoulder with 0 internal rotation 0 external rotation 80 of abduction 80 of forward flexion  Neurological: He is alert and oriented to person, place, and time.  Psychiatric: He has a normal mood and affect.            Assessment & Plan:  1. 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 This pain appears to have a neurogenic component. His worst pain is at night. He has radiation across the pectoral area. He was tried on gabapentin at night in the past and it seemed to be helpful. We'll prescribe gabapentin 300 mg each bedtime, may need dosage adjustment Consider addition of muscle relaxer  2. 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 We discussed other potential treatment options including joint injection, suprascapular nerve block. He would like to hold off on this for now  3. Right wrist fusion without pain during range of motion or palpation. 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 He has had arthritis panel recently checked by primary care physician and this was normal. Discussed treatment options including wrist splints. We'll try this for 1 week 24 7. If not much better continue this in schedule for corticosteroid injection. Will not reorder oral steroids at this time

## 2013-06-28 ENCOUNTER — Ambulatory Visit (INDEPENDENT_AMBULATORY_CARE_PROVIDER_SITE_OTHER)
Admission: RE | Admit: 2013-06-28 | Discharge: 2013-06-28 | Disposition: A | Discharge status: Home / Self Care | Payer: Medicare Other | Source: Ambulatory Visit | Attending: Internal Medicine | Admitting: Internal Medicine

## 2013-06-28 ENCOUNTER — Encounter: Payer: Self-pay | Admitting: Internal Medicine

## 2013-06-28 ENCOUNTER — Ambulatory Visit (INDEPENDENT_AMBULATORY_CARE_PROVIDER_SITE_OTHER): Payer: Medicare Other | Admitting: Internal Medicine

## 2013-06-28 VITALS — BP 120/70 | HR 74 | Temp 98.4°F | Resp 16 | Ht 69.0 in | Wt 159.1 lb

## 2013-06-28 DIAGNOSIS — I10 Essential (primary) hypertension: Secondary | ICD-10-CM

## 2013-06-28 DIAGNOSIS — M19031 Primary osteoarthritis, right wrist: Secondary | ICD-10-CM

## 2013-06-28 DIAGNOSIS — M19039 Primary osteoarthritis, unspecified wrist: Secondary | ICD-10-CM

## 2013-06-28 DIAGNOSIS — M7500 Adhesive capsulitis of unspecified shoulder: Secondary | ICD-10-CM

## 2013-06-28 DIAGNOSIS — E7800 Pure hypercholesterolemia, unspecified: Secondary | ICD-10-CM

## 2013-06-28 DIAGNOSIS — K219 Gastro-esophageal reflux disease without esophagitis: Secondary | ICD-10-CM

## 2013-06-28 DIAGNOSIS — H9193 Unspecified hearing loss, bilateral: Secondary | ICD-10-CM

## 2013-06-28 MED ORDER — COLCHICINE 0.6 MG PO TABS: 0.6000 mg | ORAL_TABLET | Freq: Every day | ORAL | Status: DC

## 2013-06-28 MED ORDER — MELOXICAM 15 MG PO TABS: 15.0000 mg | ORAL_TABLET | Freq: Every day | ORAL | Status: DC

## 2013-06-28 NOTE — Patient Instructions (Signed)
Pseudogout Pseudogout is similar to gout. It is an arthritis that causes pain, swelling, and inflammation in a joint. This is due to the presence of calcium pyrophosphate crystals in the joint fluid. This is a different type of crystal than the crystals that cause gout. The joint pain can be severe and may last for days. In some cases, it may last much longer and can mimic rheumatoid arthritis or osteoarthritis. CAUSES  The exact cause of the disease is not known. It develops when crystals of calcium pyrophosphate build up in a joint. These crystals cause inflammation that leads to pain and swelling of the joint.  Chances of developing pseudogout increase with age. It often follows a minor injury.  The condition may be passed down from parent to child (hereditary).  Events such as strokes, heart attacks, or surgery may increase the risk of pseudogout.  Pseudogout can be associated with other disorders (hemophilia, ochronosis, amyloidosis, or hormonal disorders).  Pseudogout can be associated with dehydration, especially following surgery or hospitalization. Patients with known pseudogout should stay well hydrated before and after surgery. SYMPTOMS   Intense, constant pain in one joint that seems to come on for no reason.  The joint area may be hot to the touch, red, swollen, and stiff.  Pain may last from several days to a few weeks. It may then disappear. Later, it may start again, possibly in a different joint.  Pseudogout usually affects the knees. It can also affect the wrists, elbows, shoulders, and ankles. DIAGNOSIS  The diagnosis is often suggested by your exam or is suspected when an abnormal buildup of calcium salts (calcifications) are seen in the cartilage of joints on X-rays. The final diagnosis is made when fluid from the joint is examined under a special microscope used to find calcium pyrophosphate crystals. The crystals of pseudogout and gout may both be present at the same  time. TREATMENT  Nonsteroidal anti-inflammatory drugs (NSAIDs), such as naproxen, treat the pain. Identifying the trigger of pseudogout and treating the underlying cause, such as dehydration, is also important. There is no way to remove the crystals themselves. HOME CARE INSTRUCTIONS   Put ice on the sore joint.  Put ice in a plastic bag.  Place a towel between your skin and the bag.  Leave the ice on for 15-20 minutes at a time, 03-04 times a day, for the first 2 days.  Keep your affected joints raised (elevated) when possible to lessen swelling.  Use crutches (non-weight bearing) as needed. Walk without crutches as the pain allows, or as instructed. Gradually, start bearing weight.  Only take over-the-counter or prescription medicines for pain, discomfort, or fever as directed by your caregiver.  Once you recover from the painful attack, exercise regularly to keep your muscle strength. Not using a sore joint will cause the muscles around it to become weak. This may increase pain. Low-impact exercises, such as swimming, bicycling, water aerobics, and walking, may be best. This will give you energy, strengthen your heart, help you control your weight, and improve your well-being.  Maintain a healthy weight so your joints do not need to bear more weight than necessary. SEEK MEDICAL CARE IF:   You have an increase in joint pain not relieved with medicine.  You have a fever.  You have more serious symptoms such as skin rash, diarrhea, vomiting, headache, or other joint pains. FOR MORE INFORMATION  Arthritis Foundation: www.arthritis.[facility] of Arthritis and Musculoskeletal and Skin Diseases: www.niams.[employer] Document Released: 10/11/2003  Document Revised: 04/12/2011 Document Reviewed: 05/02/2009 [facility] Patient Information 2014 [place], [place].

## 2013-06-28 NOTE — Assessment & Plan Note (Signed)
Cont the PPI 

## 2013-06-28 NOTE — Assessment & Plan Note (Addendum)
I think this is pseudogout or gout Will start colchicine He has been taking mobic and will cont as needed I will check a plain film today

## 2013-06-28 NOTE — Progress Notes (Signed)
Pre visit review using our clinic review tool, if applicable. No additional management support is needed unless otherwise documented below in the visit note. 

## 2013-06-28 NOTE — Progress Notes (Signed)
Subjective:    Patient ID: Mark Jordan, male    DOB: [birth year], 73 y.o.   MRN: [id number]  HPI Comments: New to me, for several months he has had pain and swelling on the wrists - it first started in the left wrist, he took a course of steroids, now it has traveled to the right wrist. He has seen an orthopedist for left ankle pain as well. A previous workup for inflammatory arthritis was negative.  Arthritis Presents for follow-up visit. The disease course has been fluctuating. The condition has lasted for 5 months. He complains of pain, stiffness and joint swelling. He reports no joint warmth. Affected locations include the left wrist and right wrist. His pain is at a severity of 2/10. Pertinent negatives include no diarrhea, dry eyes, dry mouth, dysuria, fatigue, fever, pain at night, pain while resting, rash, Raynaud's syndrome, uveitis or weight loss. Past treatments include corticosteroids and NSAIDs. The treatment provided moderate relief. Factors aggravating his arthritis include activity.      Review of Systems  Constitutional: Negative for fever, chills, weight loss, diaphoresis, appetite change and fatigue.  HENT: Negative.   Eyes: Negative.   Respiratory: Negative.  Negative for cough, choking, chest tightness, shortness of breath and stridor.   Cardiovascular: Negative.  Negative for chest pain, palpitations and leg swelling.  Gastrointestinal: Negative.  Negative for abdominal pain and diarrhea.  Endocrine: Negative.   Genitourinary: Negative.  Negative for dysuria.  Musculoskeletal: Positive for arthralgias (left shoulder), arthritis, joint swelling and stiffness. Negative for back pain, gait problem, myalgias, neck pain and neck stiffness.  Skin: Negative.  Negative for rash.  Allergic/Immunologic: Negative.   Neurological: Negative.   Hematological: Negative.  Negative for adenopathy. Does not bruise/bleed easily.       Objective:   Physical Exam  Vitals  reviewed. Constitutional: He is oriented to person, place, and time. He appears well-developed and well-nourished. No distress.  HENT:  Head: Normocephalic and atraumatic.  Mouth/Throat: Oropharynx is clear and moist. No oropharyngeal exudate.  Eyes: Conjunctivae are normal. Right eye exhibits no discharge. Left eye exhibits no discharge. No scleral icterus.  Neck: Normal range of motion. Neck supple. No JVD present. No tracheal deviation present. No thyromegaly present.  Cardiovascular: Normal rate, regular rhythm, normal heart sounds and intact distal pulses.  Exam reveals no gallop and no friction rub.   No murmur heard. Pulmonary/Chest: Effort normal and breath sounds normal. No stridor. No respiratory distress. He has no wheezes. He has no rales. He exhibits no tenderness.  Abdominal: Soft. Bowel sounds are normal. He exhibits no distension and no mass. There is no tenderness. There is no rebound and no guarding.  Musculoskeletal: Normal range of motion. He exhibits edema (trace edema in BLE). He exhibits no tenderness.       Right wrist: He exhibits swelling. He exhibits normal range of motion, no bony tenderness, no effusion, no crepitus, no deformity and no laceration.       Left wrist: Normal. He exhibits normal range of motion, no tenderness, no bony tenderness, no swelling, no effusion, no crepitus, no deformity and no laceration.       Arms: Lymphadenopathy:    He has no cervical adenopathy.  Neurological: He is oriented to person, place, and time.  Skin: Skin is warm and dry. No rash noted. He is not diaphoretic. No erythema. No pallor.  Psychiatric: He has a normal mood and affect. His behavior is normal. Judgment and thought content normal.  Lab Results  Component Value Date   WBC 5.4 03/24/2013   HGB 14.8 03/24/2013   HCT 43.9 03/24/2013   PLT 183 03/24/2013   GLUCOSE 109* 03/24/2013   CHOL 207* 02/07/2013   TRIG 66.0 02/07/2013   HDL 36.00* 02/07/2013   LDLDIRECT 144.2  02/07/2013   LDLCALC 133* 06/19/2012   ALT 22 02/07/2013   AST 24 02/07/2013   NA 141 03/24/2013   K 3.9 03/24/2013   CL 103 03/24/2013   CREATININE 0.72 03/24/2013   BUN 22 03/24/2013   CO2 26 03/24/2013   TSH 1.54 06/19/2012   PSA 0.80 02/07/2013   INR 0.97 09/10/2009       Assessment & Plan:

## 2013-06-29 NOTE — Assessment & Plan Note (Signed)
I have advised of the xray result He will consider an ortho evaluation of this

## 2013-06-29 NOTE — Addendum Note (Signed)
Addended by: Janith Lima on: 06/29/2013 11:38 AM   Modules accepted: Orders

## 2013-06-29 NOTE — Assessment & Plan Note (Signed)
There is a PT in [place] that he wants to see about this

## 2013-07-09 ENCOUNTER — Telehealth: Payer: Self-pay

## 2013-07-09 NOTE — Telephone Encounter (Signed)
Mark Jordan with [facility] called stating that they received referral and pt will need electrophoresis. Pt will need a Rx for dexamethasone 4mg /ml called to pharmacy for appointment. Rx will be placed on the electrophoresis pad for at least 6-10 visits. Thanks

## 2013-07-09 NOTE — Telephone Encounter (Signed)
Please send to pharmacy.

## 2013-07-10 ENCOUNTER — Other Ambulatory Visit: Payer: Self-pay

## 2013-07-10 MED ORDER — AMLODIPINE BESY-BENAZEPRIL HCL 10-20 MG PO CAPS: 1.0000 | ORAL_CAPSULE | Freq: Every day | ORAL | Status: DC

## 2013-07-10 MED ORDER — DEXAMETHASONE SODIUM PHOSPHATE 4 MG/ML IJ SOLN: 4.0000 mg | INTRAMUSCULAR | Status: DC

## 2013-07-10 NOTE — Telephone Encounter (Signed)
Rx sent and pt notified.

## 2013-07-13 ENCOUNTER — Ambulatory Visit: Payer: Medicare Other | Admitting: Physical Medicine & Rehabilitation

## 2013-07-13 ENCOUNTER — Encounter: Payer: Medicare Other | Attending: Physical Medicine & Rehabilitation

## 2013-07-13 DIAGNOSIS — G8928 Other chronic postprocedural pain: Secondary | ICD-10-CM | POA: Insufficient documentation

## 2013-07-13 DIAGNOSIS — I10 Essential (primary) hypertension: Secondary | ICD-10-CM | POA: Insufficient documentation

## 2013-07-13 DIAGNOSIS — M19039 Primary osteoarthritis, unspecified wrist: Secondary | ICD-10-CM | POA: Insufficient documentation

## 2013-07-13 DIAGNOSIS — E7800 Pure hypercholesterolemia, unspecified: Secondary | ICD-10-CM | POA: Insufficient documentation

## 2013-07-13 DIAGNOSIS — M7500 Adhesive capsulitis of unspecified shoulder: Secondary | ICD-10-CM | POA: Insufficient documentation

## 2013-07-13 DIAGNOSIS — K219 Gastro-esophageal reflux disease without esophagitis: Secondary | ICD-10-CM | POA: Insufficient documentation

## 2013-08-27 ENCOUNTER — Ambulatory Visit (INDEPENDENT_AMBULATORY_CARE_PROVIDER_SITE_OTHER): Payer: Medicare Other | Admitting: Internal Medicine

## 2013-08-27 ENCOUNTER — Other Ambulatory Visit (INDEPENDENT_AMBULATORY_CARE_PROVIDER_SITE_OTHER): Payer: Medicare Other

## 2013-08-27 ENCOUNTER — Encounter: Payer: Self-pay | Admitting: Internal Medicine

## 2013-08-27 VITALS — BP 112/70 | HR 64 | Temp 97.5°F | Resp 16 | Ht 69.0 in | Wt 158.0 lb

## 2013-08-27 DIAGNOSIS — K921 Melena: Secondary | ICD-10-CM

## 2013-08-27 DIAGNOSIS — J309 Allergic rhinitis, unspecified: Secondary | ICD-10-CM

## 2013-08-27 DIAGNOSIS — K219 Gastro-esophageal reflux disease without esophagitis: Secondary | ICD-10-CM

## 2013-08-27 DIAGNOSIS — E7800 Pure hypercholesterolemia, unspecified: Secondary | ICD-10-CM

## 2013-08-27 DIAGNOSIS — I10 Essential (primary) hypertension: Secondary | ICD-10-CM

## 2013-08-27 DIAGNOSIS — M19039 Primary osteoarthritis, unspecified wrist: Secondary | ICD-10-CM

## 2013-08-27 LAB — LIPID PANEL
Cholesterol: 176 mg/dL (ref 0–200)
HDL: 33.5 mg/dL — AB (ref >39.00)
LDL Cholesterol: 120 mg/dL — ABNORMAL HIGH (ref 0–99)
NONHDL: 142.5
Total CHOL/HDL Ratio: 5
Triglycerides: 111 mg/dL (ref 0.0–149.0)
VLDL: 22.2 mg/dL (ref 0.0–40.0)

## 2013-08-27 LAB — CBC WITH DIFFERENTIAL/PLATELET
BASOS ABS: 0 10*3/uL (ref 0.0–0.1)
BASOS PCT: 0.3 % (ref 0.0–3.0)
Eosinophils Absolute: 0.1 10*3/uL (ref 0.0–0.7)
Eosinophils Relative: 1.3 % (ref 0.0–5.0)
HEMATOCRIT: 39.6 % (ref 39.0–52.0)
HEMOGLOBIN: 13 g/dL (ref 13.0–17.0)
LYMPHS ABS: 1.8 10*3/uL (ref 0.7–4.0)
Lymphocytes Relative: 28.4 % (ref 12.0–46.0)
MCHC: 32.8 g/dL (ref 30.0–36.0)
MCV: 91.5 fl (ref 78.0–100.0)
MONOS PCT: 7.8 % (ref 3.0–12.0)
Monocytes Absolute: 0.5 10*3/uL (ref 0.1–1.0)
Neutro Abs: 3.9 10*3/uL (ref 1.4–7.7)
Neutrophils Relative %: 62.2 % (ref 43.0–77.0)
Platelets: 281 10*3/uL (ref 150.0–400.0)
RBC: 4.33 Mil/uL (ref 4.22–5.81)
RDW: 14.3 % (ref 11.5–15.5)
WBC: 6.2 10*3/uL (ref 4.0–10.5)

## 2013-08-27 LAB — COMPREHENSIVE METABOLIC PANEL
ALT: 18 U/L (ref 0–53)
AST: 21 U/L (ref 0–37)
Albumin: 3.4 g/dL — ABNORMAL LOW (ref 3.5–5.2)
Alkaline Phosphatase: 52 U/L (ref 39–117)
BILIRUBIN TOTAL: 0.6 mg/dL (ref 0.2–1.2)
BUN: 25 mg/dL — ABNORMAL HIGH (ref 6–23)
CALCIUM: 8.6 mg/dL (ref 8.4–10.5)
CO2: 27 mEq/L (ref 19–32)
CREATININE: 0.9 mg/dL (ref 0.4–1.5)
Chloride: 109 mEq/L (ref 96–112)
GFR: 90.9 mL/min (ref >60.00)
GLUCOSE: 97 mg/dL (ref 70–99)
Potassium: 4.1 mEq/L (ref 3.5–5.1)
Sodium: 140 mEq/L (ref 135–145)
Total Protein: 6.4 g/dL (ref 6.0–8.3)

## 2013-08-27 LAB — TSH: TSH: 1.54 u[IU]/mL (ref 0.35–4.50)

## 2013-08-27 NOTE — Progress Notes (Signed)
Pre visit review using our clinic review tool, if applicable. No additional management support is needed unless otherwise documented below in the visit note. 

## 2013-08-27 NOTE — Progress Notes (Signed)
Subjective:    Patient ID: Mark Jordan, male    DOB: 1940-11-24, 73 y.o.   MRN: [id number]  HPI Comments: He has vague symptoms of weakness, fatigue, and GERD and tells me that this is the way that her felt when he had stomach ulcers in 1985.     Review of Systems  Constitutional: Positive for fatigue. Negative for fever, chills, diaphoresis, activity change, appetite change and unexpected weight change.  HENT: Negative.  Negative for trouble swallowing and voice change.   Eyes: Negative.   Respiratory: Negative.  Negative for cough, choking, chest tightness, shortness of breath and stridor.   Cardiovascular: Negative.  Negative for chest pain, palpitations and leg swelling.  Gastrointestinal: Positive for constipation. Negative for nausea, abdominal pain, diarrhea, blood in stool, abdominal distention, anal bleeding and rectal pain.  Endocrine: Negative.   Genitourinary: Negative.   Musculoskeletal: Negative.  Negative for arthralgias, back pain, myalgias and neck stiffness.  Skin: Negative.  Negative for rash.  Allergic/Immunologic: Negative.   Neurological: Negative.   Hematological: Negative.  Negative for adenopathy. Does not bruise/bleed easily.  Psychiatric/Behavioral: Negative.        Objective:   Physical Exam  Vitals reviewed. Constitutional: He is oriented to person, place, and time. He appears well-developed and well-nourished. No distress.  HENT:  Head: Normocephalic and atraumatic.  Mouth/Throat: Oropharynx is clear and moist. No oropharyngeal exudate.  Eyes: Conjunctivae are normal. Right eye exhibits no discharge. Left eye exhibits no discharge. No scleral icterus.  Neck: Normal range of motion. Neck supple. No JVD present. No tracheal deviation present. No thyromegaly present.  Cardiovascular: Normal rate, regular rhythm, normal heart sounds and intact distal pulses.  Exam reveals no gallop and no friction rub.   No murmur heard. Pulmonary/Chest: Effort  normal and breath sounds normal. No stridor. No respiratory distress. He has no wheezes. He has no rales. He exhibits no tenderness.  Abdominal: Soft. Bowel sounds are normal. He exhibits no distension and no mass. There is no tenderness. There is no rebound and no guarding. Hernia confirmed negative in the right inguinal area and confirmed negative in the left inguinal area.  Genitourinary: Testes normal and penis normal. Rectal exam shows no external hemorrhoid, no internal hemorrhoid, no fissure, no mass, no tenderness and anal tone normal. Guaiac positive stool. Prostate is enlarged (1+ smooth symm BPH). Prostate is not tender. Right testis shows no mass, no swelling and no tenderness. Right testis is descended. Left testis shows no mass, no swelling and no tenderness. Left testis is descended. Uncircumcised. No penile erythema or penile tenderness. No discharge found.  Musculoskeletal: Normal range of motion. He exhibits no edema and no tenderness.  Lymphadenopathy:    He has no cervical adenopathy.       Right: No inguinal adenopathy present.       Left: No inguinal adenopathy present.  Neurological: He is oriented to person, place, and time.  Skin: Skin is warm and dry. No rash noted. He is not diaphoretic. No erythema. No pallor.     Lab Results  Component Value Date   WBC 5.4 03/24/2013   HGB 14.8 03/24/2013   HCT 43.9 03/24/2013   PLT 183 03/24/2013   GLUCOSE 109* 03/24/2013   CHOL 207* 02/07/2013   TRIG 66.0 02/07/2013   HDL 36.00* 02/07/2013   LDLDIRECT 144.2 02/07/2013   LDLCALC 133* 06/19/2012   ALT 22 02/07/2013   AST 24 02/07/2013   NA 141 03/24/2013   K 3.9 03/24/2013  CL 103 03/24/2013   CREATININE 0.72 03/24/2013   BUN 22 03/24/2013   CO2 26 03/24/2013   TSH 1.54 06/19/2012   PSA 0.80 02/07/2013   INR 0.97 09/10/2009       Assessment & Plan:

## 2013-08-27 NOTE — Patient Instructions (Signed)
Gastroesophageal Reflux Disease, Adult Gastroesophageal reflux disease (GERD) happens when acid from your stomach flows up into the esophagus. When acid comes in contact with the esophagus, the acid causes soreness (inflammation) in the esophagus. Over time, GERD may create small holes (ulcers) in the lining of the esophagus. CAUSES   Increased body weight. This puts pressure on the stomach, making acid rise from the stomach into the esophagus.  Smoking. This increases acid production in the stomach.  Drinking alcohol. This causes decreased pressure in the lower esophageal sphincter (valve or ring of muscle between the esophagus and stomach), allowing acid from the stomach into the esophagus.  Late evening meals and a full stomach. This increases pressure and acid production in the stomach.  A malformed lower esophageal sphincter. Sometimes, no cause is found. SYMPTOMS   Burning pain in the lower part of the mid-chest behind the breastbone and in the mid-stomach area. This may occur twice a week or more often.  Trouble swallowing.  Sore throat.  Dry cough.  Asthma-like symptoms including chest tightness, shortness of breath, or wheezing. DIAGNOSIS  Your caregiver may be able to diagnose GERD based on your symptoms. In some cases, X-rays and other tests may be done to check for complications or to check the condition of your stomach and esophagus. TREATMENT  Your caregiver may recommend over-the-counter or prescription medicines to help decrease acid production. Ask your caregiver before starting or adding any new medicines.  HOME CARE INSTRUCTIONS   Change the factors that you can control. Ask your caregiver for guidance concerning weight loss, quitting smoking, and alcohol consumption.  Avoid foods and drinks that make your symptoms worse, such as:  Caffeine or alcoholic drinks.  Chocolate.  Peppermint or mint flavorings.  Garlic and onions.  Spicy foods.  Citrus fruits,  such as oranges, lemons, or limes.  Tomato-based foods such as sauce, chili, salsa, and pizza.  Fried and fatty foods.  Avoid lying down for the 3 hours prior to your bedtime or prior to taking a nap.  Eat small, frequent meals instead of large meals.  Wear loose-fitting clothing. Do not wear anything tight around your waist that causes pressure on your stomach.  Raise the head of your bed 6 to 8 inches with wood blocks to help you sleep. Extra pillows will not help.  Only take over-the-counter or prescription medicines for pain, discomfort, or fever as directed by your caregiver.  Do not take aspirin, ibuprofen, or other nonsteroidal anti-inflammatory drugs (NSAIDs). SEEK IMMEDIATE MEDICAL CARE IF:   You have pain in your arms, neck, jaw, teeth, or back.  Your pain increases or changes in intensity or duration.  You develop nausea, vomiting, or sweating (diaphoresis).  You develop shortness of breath, or you faint.  Your vomit is green, yellow, black, or looks like coffee grounds or blood.  Your stool is red, bloody, or black. These symptoms could be signs of other problems, such as heart disease, gastric bleeding, or esophageal bleeding. MAKE SURE YOU:   Understand these instructions.  Will watch your condition.  Will get help right away if you are not doing well or get worse. Document Released: 10/28/2004 Document Revised: 04/12/2011 Document Reviewed: 08/07/2010 [employer] Patient Information 2015 [employer]. This information is not intended to replace advice given to you by your health care provider. Make sure you discuss any questions you have with your health care provider.  

## 2013-08-28 ENCOUNTER — Encounter: Payer: Self-pay | Admitting: Internal Medicine

## 2013-08-28 ENCOUNTER — Telehealth: Payer: Self-pay

## 2013-08-28 NOTE — Assessment & Plan Note (Signed)
His BP is well controlled 

## 2013-08-28 NOTE — Telephone Encounter (Signed)
Renee called in wanting to know the results of the labs that were done recently.

## 2013-08-28 NOTE — Assessment & Plan Note (Signed)
He has recently received nsaids and steroids, he has Heme + stool, his H/H has dropped some I am concerned that he may have upper GI pathology so I have asked him to see his GI (Medoff) For now he will stop the steroids and nsaids

## 2013-08-28 NOTE — Assessment & Plan Note (Signed)
I have asked him to f/up with his GI doctor

## 2013-08-30 ENCOUNTER — Telehealth: Payer: Self-pay | Admitting: *Deleted

## 2013-08-30 NOTE — Telephone Encounter (Signed)
Pt daughter called and wwas requesting results from labs done 08/27/13. Inform daughter md has mail out lab letter. Gave her md response. Concerning pt labs. She is wanting a copy of labs fax to Dr. Earlean Shawl. Inform her we will send...Johny Chess

## 2013-09-03 DIAGNOSIS — H35363 Drusen (degenerative) of macula, bilateral: Secondary | ICD-10-CM | POA: Insufficient documentation

## 2013-09-03 DIAGNOSIS — H2513 Age-related nuclear cataract, bilateral: Secondary | ICD-10-CM | POA: Insufficient documentation

## 2013-09-04 ENCOUNTER — Ambulatory Visit: Payer: Medicare Other | Admitting: Internal Medicine

## 2013-09-04 ENCOUNTER — Telehealth: Payer: Self-pay | Admitting: Pulmonary Disease

## 2013-09-04 NOTE — Telephone Encounter (Signed)
No need for message. °

## 2013-09-08 ENCOUNTER — Other Ambulatory Visit: Payer: Self-pay | Admitting: Physical Medicine & Rehabilitation

## 2013-09-08 ENCOUNTER — Other Ambulatory Visit: Payer: Self-pay | Admitting: Pulmonary Disease

## 2013-09-10 ENCOUNTER — Telehealth: Payer: Self-pay

## 2013-09-10 MED ORDER — ALPRAZOLAM 0.5 MG PO TABS
0.2500 mg | ORAL_TABLET | Freq: Three times a day (TID) | ORAL | Status: DC | PRN
Start: 2013-09-10 — End: 2013-12-24

## 2013-09-10 NOTE — Telephone Encounter (Signed)
done

## 2013-09-10 NOTE — Telephone Encounter (Signed)
rx request received for xanax 0.5 mg qty 90, do you want to refill? Please advise

## 2013-09-11 ENCOUNTER — Telehealth: Payer: Self-pay | Admitting: *Deleted

## 2013-09-11 ENCOUNTER — Telehealth: Payer: Self-pay

## 2013-09-11 MED ORDER — GABAPENTIN 300 MG PO CAPS: 300.0000 mg | ORAL_CAPSULE | Freq: Every day | ORAL | Status: DC

## 2013-09-11 NOTE — Telephone Encounter (Signed)
yes

## 2013-09-11 NOTE — Telephone Encounter (Signed)
Please advise if ok to refill metaxalone 800mg  TID prn, Patient recently seen.

## 2013-09-11 NOTE — Telephone Encounter (Signed)
Notified pt daughter Joseph Art) med sent to pharmacy...Johny Chess

## 2013-09-11 NOTE — Telephone Encounter (Signed)
Left msg on triage stating father is needing refills on his gabapentin. Med was originally rx by another md that he no longer see. Is this ok to refll...Johny Chess

## 2013-09-13 NOTE — Telephone Encounter (Signed)
Rx called to pharmacy

## 2013-10-29 ENCOUNTER — Encounter: Payer: Self-pay | Admitting: Internal Medicine

## 2013-10-29 ENCOUNTER — Other Ambulatory Visit (INDEPENDENT_AMBULATORY_CARE_PROVIDER_SITE_OTHER): Payer: Medicare Other

## 2013-10-29 ENCOUNTER — Ambulatory Visit (INDEPENDENT_AMBULATORY_CARE_PROVIDER_SITE_OTHER): Payer: Medicare Other | Admitting: Internal Medicine

## 2013-10-29 VITALS — BP 120/68 | HR 64 | Temp 98.2°F | Resp 16 | Ht 69.0 in | Wt 153.0 lb

## 2013-10-29 DIAGNOSIS — R5383 Other fatigue: Secondary | ICD-10-CM

## 2013-10-29 DIAGNOSIS — I10 Essential (primary) hypertension: Secondary | ICD-10-CM

## 2013-10-29 DIAGNOSIS — G4700 Insomnia, unspecified: Secondary | ICD-10-CM

## 2013-10-29 DIAGNOSIS — R5381 Other malaise: Secondary | ICD-10-CM

## 2013-10-29 LAB — CBC WITH DIFFERENTIAL/PLATELET
Basophils Absolute: 0 10*3/uL (ref 0.0–0.1)
Basophils Relative: 0.3 % (ref 0.0–3.0)
Eosinophils Absolute: 0.8 10*3/uL — ABNORMAL HIGH (ref 0.0–0.7)
Eosinophils Relative: 10.9 % — ABNORMAL HIGH (ref 0.0–5.0)
HEMATOCRIT: 42.5 % (ref 39.0–52.0)
Hemoglobin: 14.2 g/dL (ref 13.0–17.0)
LYMPHS PCT: 24.7 % (ref 12.0–46.0)
Lymphs Abs: 1.8 10*3/uL (ref 0.7–4.0)
MCHC: 33.3 g/dL (ref 30.0–36.0)
MCV: 91.5 fl (ref 78.0–100.0)
MONOS PCT: 6.2 % (ref 3.0–12.0)
Monocytes Absolute: 0.5 10*3/uL (ref 0.1–1.0)
Neutro Abs: 4.2 10*3/uL (ref 1.4–7.7)
Neutrophils Relative %: 57.9 % (ref 43.0–77.0)
PLATELETS: 205 10*3/uL (ref 150.0–400.0)
RBC: 4.65 Mil/uL (ref 4.22–5.81)
RDW: 15.4 % (ref 11.5–15.5)
WBC: 7.3 10*3/uL (ref 4.0–10.5)

## 2013-10-29 LAB — COMPREHENSIVE METABOLIC PANEL
ALBUMIN: 3.9 g/dL (ref 3.5–5.2)
ALT: 18 U/L (ref 0–53)
AST: 21 U/L (ref 0–37)
Alkaline Phosphatase: 63 U/L (ref 39–117)
BUN: 15 mg/dL (ref 6–23)
CALCIUM: 9.1 mg/dL (ref 8.4–10.5)
CHLORIDE: 104 meq/L (ref 96–112)
CO2: 27 meq/L (ref 19–32)
CREATININE: 0.7 mg/dL (ref 0.4–1.5)
GFR: 118.72 mL/min (ref >60.00)
Glucose, Bld: 89 mg/dL (ref 70–99)
POTASSIUM: 4.2 meq/L (ref 3.5–5.1)
Sodium: 139 mEq/L (ref 135–145)
TOTAL PROTEIN: 7 g/dL (ref 6.0–8.3)
Total Bilirubin: 0.9 mg/dL (ref 0.2–1.2)

## 2013-10-29 MED ORDER — SUVOREXANT 20 MG PO TABS: 1.0000 | ORAL_TABLET | Freq: Every day | ORAL | Status: DC

## 2013-10-29 NOTE — Progress Notes (Signed)
Subjective:    Patient ID: Mark Jordan, male    DOB: 03/27/1940, 73 y.o.   MRN: [id number]  HPI Comments: He complains of a vague sense of fatigue and weakness over the last month. The symptoms are intermittent and unrelated to activities. He has a few spells of dizziness and lightheadedness but no palpitations or near-syncope.  Hypertension Pertinent negatives include no chest pain, headaches, neck pain, palpitations or shortness of breath.      Review of Systems  Constitutional: Positive for fatigue. Negative for fever, chills, diaphoresis, activity change, appetite change and unexpected weight change.  HENT: Negative.   Eyes: Negative.   Respiratory: Negative.  Negative for apnea, cough, choking, chest tightness, shortness of breath, wheezing and stridor.   Cardiovascular: Negative.  Negative for chest pain, palpitations and leg swelling.  Gastrointestinal: Negative.  Negative for nausea, vomiting, abdominal pain, diarrhea, constipation and blood in stool.  Endocrine: Negative.   Genitourinary: Negative.   Musculoskeletal: Negative.  Negative for arthralgias, back pain, gait problem and neck pain.  Skin: Negative.  Negative for rash.  Allergic/Immunologic: Negative.   Neurological: Positive for dizziness and light-headedness. Negative for tremors, seizures, syncope, facial asymmetry, speech difficulty, weakness, numbness and headaches.  Hematological: Negative.  Negative for adenopathy. Does not bruise/bleed easily.  Psychiatric/Behavioral: Positive for sleep disturbance. Negative for suicidal ideas, hallucinations, behavioral problems, confusion, self-injury, dysphoric mood and decreased concentration. The patient is not nervous/anxious and is not hyperactive.        He complains of persistent insomnia and tells me that xanax does not help much.       Objective:   Physical Exam  Vitals reviewed. Constitutional: He is oriented to person, place, and time. He appears  well-developed and well-nourished. No distress.  HENT:  Head: Normocephalic and atraumatic.  Mouth/Throat: Oropharynx is clear and moist. No oropharyngeal exudate.  Eyes: Conjunctivae are normal. Right eye exhibits no discharge. Left eye exhibits no discharge. No scleral icterus.  Neck: Normal range of motion. Neck supple. No JVD present. No tracheal deviation present. No thyromegaly present.  Cardiovascular: Normal rate, regular rhythm, normal heart sounds and intact distal pulses.  Exam reveals no gallop and no friction rub.   No murmur heard. Pulmonary/Chest: Effort normal and breath sounds normal. No stridor. No respiratory distress. He has no wheezes. He has no rales. He exhibits no tenderness.  Abdominal: Soft. Bowel sounds are normal. He exhibits no distension and no mass. There is no tenderness. There is no rebound and no guarding.  Musculoskeletal: Normal range of motion. He exhibits no edema and no tenderness.  Lymphadenopathy:    He has no cervical adenopathy.  Neurological: He is oriented to person, place, and time.  Skin: Skin is warm and dry. No rash noted. He is not diaphoretic. No erythema. No pallor.  Psychiatric: He has a normal mood and affect. His behavior is normal. Judgment and thought content normal.    Lab Results  Component Value Date   WBC 6.2 08/27/2013   HGB 13.0 08/27/2013   HCT 39.6 08/27/2013   PLT 281.0 08/27/2013   GLUCOSE 97 08/27/2013   CHOL 176 08/27/2013   TRIG 111.0 08/27/2013   HDL 33.50* 08/27/2013   LDLDIRECT 144.2 02/07/2013   LDLCALC 120* 08/27/2013   ALT 18 08/27/2013   AST 21 08/27/2013   NA 140 08/27/2013   K 4.1 08/27/2013   CL 109 08/27/2013   CREATININE 0.9 08/27/2013   BUN 25* 08/27/2013   CO2 27 08/27/2013  TSH 1.54 08/27/2013   PSA 0.80 02/07/2013   INR 0.97 09/10/2009        Assessment & Plan:

## 2013-10-29 NOTE — Patient Instructions (Signed)

## 2013-10-29 NOTE — Progress Notes (Signed)
Pre visit review using our clinic review tool, if applicable. No additional management support is needed unless otherwise documented below in the visit note. 

## 2013-10-30 ENCOUNTER — Encounter: Payer: Self-pay | Admitting: Internal Medicine

## 2013-10-30 LAB — CARDIAC PANEL
CK-MB: 2.3 ng/mL (ref 0.3–4.0)
RELATIVE INDEX: 4.8 calc — AB (ref 0.0–2.5)
Total CK: 48 U/L (ref 7–232)

## 2013-10-30 LAB — TSH: TSH: 2.6 u[IU]/mL (ref 0.35–4.50)

## 2013-10-30 LAB — TROPONIN I: Troponin I: 0.01 ng/mL (ref <0.06)

## 2013-10-30 LAB — BRAIN NATRIURETIC PEPTIDE: Pro B Natriuretic peptide (BNP): 15 pg/mL (ref 0.0–100.0)

## 2013-10-30 MED ORDER — OMEGA-3-ACID ETHYL ESTERS 1 G PO CAPS: 2.0000 g | ORAL_CAPSULE | Freq: Two times a day (BID) | ORAL | Status: DC

## 2013-10-30 NOTE — Assessment & Plan Note (Signed)
His EKG shows sinus bradycardia with LVH and NS ST/T wave changes, this is unchanged from his prior EKG His cardiac enzymes are normal and the other labs do not show a cause for his fatigue and other vague s/s Will follow for now

## 2013-10-30 NOTE — Assessment & Plan Note (Signed)
He will try belsomra for this

## 2013-11-12 ENCOUNTER — Encounter: Payer: Self-pay | Admitting: Internal Medicine

## 2013-12-24 ENCOUNTER — Ambulatory Visit (INDEPENDENT_AMBULATORY_CARE_PROVIDER_SITE_OTHER): Payer: Medicare Other | Admitting: Internal Medicine

## 2013-12-24 ENCOUNTER — Encounter: Payer: Self-pay | Admitting: Internal Medicine

## 2013-12-24 VITALS — BP 116/80 | HR 58 | Temp 98.3°F | Resp 16 | Ht 69.0 in | Wt 153.0 lb

## 2013-12-24 DIAGNOSIS — M159 Polyosteoarthritis, unspecified: Secondary | ICD-10-CM

## 2013-12-24 DIAGNOSIS — M7502 Adhesive capsulitis of left shoulder: Secondary | ICD-10-CM

## 2013-12-24 DIAGNOSIS — I10 Essential (primary) hypertension: Secondary | ICD-10-CM

## 2013-12-24 DIAGNOSIS — M150 Primary generalized (osteo)arthritis: Secondary | ICD-10-CM

## 2013-12-24 DIAGNOSIS — M19031 Primary osteoarthritis, right wrist: Secondary | ICD-10-CM

## 2013-12-24 MED ORDER — HYDROCODONE-ACETAMINOPHEN 10-325 MG PO TABS: 1.0000 | ORAL_TABLET | Freq: Four times a day (QID) | ORAL | Status: DC | PRN

## 2013-12-24 MED ORDER — ALPRAZOLAM 0.5 MG PO TABS: 0.2500 mg | ORAL_TABLET | Freq: Three times a day (TID) | ORAL | Status: DC | PRN

## 2013-12-24 NOTE — Progress Notes (Signed)
Subjective:    Patient ID: Mark Jordan, male    DOB: 1940-02-05, 73 y.o.   MRN: [id number]  Shoulder Pain  The pain is present in the right shoulder and left shoulder. This is a chronic problem. The current episode started more than 1 year ago. There has been no history of extremity trauma. The problem occurs intermittently. The problem has been gradually worsening. The quality of the pain is described as aching. The pain is at a severity of 4/10. The pain is moderate. Associated symptoms include a limited range of motion. Pertinent negatives include no fever, inability to bear weight, itching, joint locking, joint swelling, numbness, stiffness or tingling. The symptoms are aggravated by activity. He has tried nothing for the symptoms. The treatment provided no relief.      Review of Systems  Constitutional: Negative.  Negative for fever, chills, diaphoresis, appetite change and fatigue.  HENT: Negative.   Eyes: Negative.   Respiratory: Negative.  Negative for cough, choking, chest tightness, shortness of breath and stridor.   Cardiovascular: Negative.  Negative for chest pain, palpitations and leg swelling.  Gastrointestinal: Negative.  Negative for abdominal pain.  Endocrine: Negative.   Genitourinary: Negative.   Musculoskeletal: Positive for arthralgias. Negative for myalgias, back pain, joint swelling, gait problem, stiffness, neck pain and neck stiffness.  Skin: Negative.  Negative for itching.  Allergic/Immunologic: Negative.   Neurological: Negative.  Negative for dizziness, tingling, tremors, speech difficulty, weakness, light-headedness and numbness.  Hematological: Negative.  Negative for adenopathy. Does not bruise/bleed easily.  Psychiatric/Behavioral: Positive for sleep disturbance. Negative for suicidal ideas, hallucinations, behavioral problems, confusion, self-injury, dysphoric mood, decreased concentration and agitation. The patient is nervous/anxious. The patient is  not hyperactive.        Objective:   Physical Exam  Constitutional: He is oriented to person, place, and time. He appears well-developed and well-nourished. No distress.  HENT:  Head: Normocephalic and atraumatic.  Mouth/Throat: Oropharynx is clear and moist. No oropharyngeal exudate.  Eyes: Conjunctivae are normal. Right eye exhibits no discharge. Left eye exhibits no discharge. No scleral icterus.  Neck: Normal range of motion. Neck supple. No JVD present. No tracheal deviation present. No thyromegaly present.  Cardiovascular: Normal rate, regular rhythm, normal heart sounds and intact distal pulses.  Exam reveals no gallop and no friction rub.   No murmur heard. Pulmonary/Chest: Effort normal and breath sounds normal. No stridor. No respiratory distress. He has no wheezes. He has no rales. He exhibits no tenderness.  Abdominal: Soft. Bowel sounds are normal. He exhibits no distension. There is no tenderness. There is no rebound and no guarding.  Musculoskeletal: He exhibits no edema or tenderness.       Right shoulder: He exhibits decreased range of motion. He exhibits no tenderness, no bony tenderness, no swelling, no effusion, no crepitus and no deformity.       Left shoulder: He exhibits decreased range of motion. He exhibits no tenderness, no bony tenderness, no swelling, no effusion and no deformity.  Lymphadenopathy:    He has no cervical adenopathy.  Neurological: He is oriented to person, place, and time.  Skin: Skin is warm and dry. No rash noted. He is not diaphoretic. No erythema. No pallor.  Psychiatric: He has a normal mood and affect. His behavior is normal. Judgment and thought content normal.  Vitals reviewed.    Lab Results  Component Value Date   WBC 7.3 10/29/2013   HGB 14.2 10/29/2013   HCT 42.5 10/29/2013  PLT 205.0 10/29/2013   GLUCOSE 89 10/29/2013   CHOL 176 08/27/2013   TRIG 111.0 08/27/2013   HDL 33.50* 08/27/2013   LDLDIRECT 144.2 02/07/2013    LDLCALC 120* 08/27/2013   ALT 18 10/29/2013   AST 21 10/29/2013   NA 139 10/29/2013   K 4.2 10/29/2013   CL 104 10/29/2013   CREATININE 0.7 10/29/2013   BUN 15 10/29/2013   CO2 27 10/29/2013   TSH 2.60 10/29/2013   PSA 0.80 02/07/2013   INR 0.97 09/10/2009       Assessment & Plan:

## 2013-12-24 NOTE — Assessment & Plan Note (Signed)
Will take norco for the pain

## 2013-12-24 NOTE — Assessment & Plan Note (Signed)
His BP is well controlled 

## 2013-12-24 NOTE — Assessment & Plan Note (Signed)
He will try norco for the pain

## 2013-12-24 NOTE — Patient Instructions (Signed)

## 2013-12-24 NOTE — Progress Notes (Signed)
Pre visit review using our clinic review tool, if applicable. No additional management support is needed unless otherwise documented below in the visit note. 

## 2013-12-25 ENCOUNTER — Telehealth: Payer: Self-pay | Admitting: Internal Medicine

## 2013-12-25 NOTE — Telephone Encounter (Signed)
emmi emailed °

## 2014-01-11 ENCOUNTER — Ambulatory Visit: Payer: Medicare Other | Admitting: Family

## 2014-01-14 ENCOUNTER — Encounter: Payer: Self-pay | Admitting: Internal Medicine

## 2014-01-14 ENCOUNTER — Ambulatory Visit (INDEPENDENT_AMBULATORY_CARE_PROVIDER_SITE_OTHER): Payer: Medicare Other | Admitting: Internal Medicine

## 2014-01-14 VITALS — BP 128/84 | HR 60 | Temp 97.7°F | Resp 16 | Ht 69.0 in | Wt 153.0 lb

## 2014-01-14 DIAGNOSIS — R634 Abnormal weight loss: Secondary | ICD-10-CM

## 2014-01-14 LAB — CBC WITH DIFFERENTIAL/PLATELET
ALK PHOS: 50 U/L
ALT: 23
AST: 27 U/L
BUN: 26 mg/dL — AB (ref 4–21)
Calcium: 8.9 mg/dL
Creat: 0.7
GLUCOSE: 113
HEMATOCRIT: 42 %
HEMOGLOBIN: 13.9 g/dL
PLATELETS: 213 10*3/uL
Potassium: 4 mmol/L
RBC: 4.56
SODIUM: 143 mmol/L (ref 137–147)
Total Bilirubin: 0.5 mg/dL
WBC: 6
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
## 2014-01-14 NOTE — Assessment & Plan Note (Signed)
Recent labs done at [facility] normal Will get a CT scan of abd to look for mass in liver, pancreas, kidneys

## 2014-01-21 ENCOUNTER — Ambulatory Visit: Payer: Medicare Other | Admitting: Internal Medicine

## 2014-01-22 ENCOUNTER — Encounter: Payer: Self-pay | Admitting: Internal Medicine

## 2014-01-22 ENCOUNTER — Ambulatory Visit (INDEPENDENT_AMBULATORY_CARE_PROVIDER_SITE_OTHER)
Admission: RE | Admit: 2014-01-22 | Discharge: 2014-01-22 | Disposition: A | Discharge status: Home / Self Care | Payer: Medicare Other | Source: Ambulatory Visit | Attending: Internal Medicine | Admitting: Internal Medicine

## 2014-01-22 ENCOUNTER — Ambulatory Visit (INDEPENDENT_AMBULATORY_CARE_PROVIDER_SITE_OTHER): Payer: Medicare Other | Admitting: Internal Medicine

## 2014-01-22 VITALS — BP 158/90 | HR 68 | Temp 98.1°F | Ht 68.0 in | Wt 152.0 lb

## 2014-01-22 DIAGNOSIS — M5416 Radiculopathy, lumbar region: Secondary | ICD-10-CM
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
## 2014-01-22 NOTE — Progress Notes (Signed)
Pre visit review using our clinic review tool, if applicable. No additional management support is needed unless otherwise documented below in the visit note. 

## 2014-01-22 NOTE — Patient Instructions (Addendum)
Assess response to the gabapentin 300 mg one every 8 hours as needed.  Your next office appointment will be determined based upon review of your pending  x-rays. Those instructions will be transmitted to you through My Chart  OR  by mail;whichever process is your choice to receive results & recommendations .Followup as needed for your acute issue. Please report any significant change in your symptoms.

## 2014-01-30 ENCOUNTER — Ambulatory Visit (INDEPENDENT_AMBULATORY_CARE_PROVIDER_SITE_OTHER)
Admission: RE | Admit: 2014-01-30 | Discharge: 2014-01-30 | Disposition: A | Discharge status: Home / Self Care | Payer: Medicare Other | Source: Ambulatory Visit | Attending: Internal Medicine | Admitting: Internal Medicine

## 2014-01-30 DIAGNOSIS — R634 Abnormal weight loss: Secondary | ICD-10-CM

## 2014-01-30 MED ORDER — IOHEXOL 300 MG/ML  SOLN
100.0000 mL | Freq: Once | INTRAMUSCULAR | Status: AC | PRN
Start: 2014-01-30 — End: 2014-01-30
  Administered 2014-01-30: 100 mL via INTRAVENOUS

## 2014-02-01 ENCOUNTER — Other Ambulatory Visit: Payer: Self-pay | Admitting: Internal Medicine

## 2014-02-01 ENCOUNTER — Encounter: Payer: Self-pay | Admitting: Internal Medicine

## 2014-02-01 DIAGNOSIS — N3289 Other specified disorders of bladder: Secondary | ICD-10-CM | POA: Insufficient documentation

## 2014-02-05 ENCOUNTER — Encounter: Payer: Self-pay | Admitting: Internal Medicine

## 2014-02-28 ENCOUNTER — Encounter: Payer: Self-pay | Admitting: Internal Medicine

## 2014-02-28 ENCOUNTER — Ambulatory Visit (INDEPENDENT_AMBULATORY_CARE_PROVIDER_SITE_OTHER): Payer: Medicare Other | Admitting: Internal Medicine

## 2014-02-28 VITALS — BP 128/68 | HR 66 | Temp 98.0°F | Resp 16 | Ht 68.0 in | Wt 152.0 lb

## 2014-02-28 DIAGNOSIS — N3289 Other specified disorders of bladder: Secondary | ICD-10-CM

## 2014-02-28 DIAGNOSIS — I10 Essential (primary) hypertension: Secondary | ICD-10-CM

## 2014-02-28 DIAGNOSIS — G4700 Insomnia, unspecified: Secondary | ICD-10-CM

## 2014-02-28 MED ORDER — DOXEPIN HCL 6 MG PO TABS: 1.0000 | ORAL_TABLET | Freq: Every evening | ORAL | Status: DC | PRN

## 2014-02-28 NOTE — Patient Instructions (Signed)
Insomnia Insomnia is frequent trouble falling and/or staying asleep. Insomnia can be a long term problem or a short term problem. Both are common. Insomnia can be a short term problem when the wakefulness is related to a certain stress or worry. Long term insomnia is often related to ongoing stress during waking hours and/or poor sleeping habits. Overtime, sleep deprivation itself can make the problem worse. Every little thing feels more severe because you are overtired and your ability to cope is decreased. CAUSES   Stress, anxiety, and depression.  Poor sleeping habits.  Distractions such as TV in the bedroom.  Naps close to bedtime.  Engaging in emotionally charged conversations before bed.  Technical reading before sleep.  Alcohol and other sedatives. They may make the problem worse. They can hurt normal sleep patterns and normal dream activity.  Stimulants such as caffeine for several hours prior to bedtime.  Pain syndromes and shortness of breath can cause insomnia.  Exercise late at night.  Changing time zones may cause sleeping problems (jet lag). It is sometimes helpful to have someone observe your sleeping patterns. They should look for periods of not breathing during the night (sleep apnea). They should also look to see how long those periods last. If you live alone or observers are uncertain, you can also be observed at a sleep clinic where your sleep patterns will be professionally monitored. Sleep apnea requires a checkup and treatment. Give your caregivers your medical history. Give your caregivers observations your family has made about your sleep.  SYMPTOMS   Not feeling rested in the morning.  Anxiety and restlessness at bedtime.  Difficulty falling and staying asleep. TREATMENT   Your caregiver may prescribe treatment for an underlying medical disorders. Your caregiver can give advice or help if you are using alcohol or other drugs for self-medication. Treatment  of underlying problems will usually eliminate insomnia problems.  Medications can be prescribed for short time use. They are generally not recommended for lengthy use.  Over-the-counter sleep medicines are not recommended for lengthy use. They can be habit forming.  You can promote easier sleeping by making lifestyle changes such as:  Using relaxation techniques that help with breathing and reduce muscle tension.  Exercising earlier in the day.  Changing your diet and the time of your last meal. No night time snacks.  Establish a regular time to go to bed.  Counseling can help with stressful problems and worry.  Soothing music and white noise may be helpful if there are background noises you cannot remove.  Stop tedious detailed work at least one hour before bedtime. HOME CARE INSTRUCTIONS   Keep a diary. Inform your caregiver about your progress. This includes any medication side effects. See your caregiver regularly. Take note of:  Times when you are asleep.  Times when you are awake during the night.  The quality of your sleep.  How you feel the next day. This information will help your caregiver care for you.  Get out of bed if you are still awake after 15 minutes. Read or do some quiet activity. Keep the lights down. Wait until you feel sleepy and go back to bed.  Keep regular sleeping and waking hours. Avoid naps.  Exercise regularly.  Avoid distractions at bedtime. Distractions include watching television or engaging in any intense or detailed activity like attempting to balance the household checkbook.  Develop a bedtime ritual. Keep a familiar routine of bathing, brushing your teeth, climbing into bed at the same   time each night, listening to soothing music. Routines increase the success of falling to sleep faster.  Use relaxation techniques. This can be using breathing and muscle tension release routines. It can also include visualizing peaceful scenes. You can  also help control troubling or intruding thoughts by keeping your mind occupied with boring or repetitive thoughts like the old concept of counting sheep. You can make it more creative like imagining planting one beautiful flower after another in your backyard garden.  During your day, work to eliminate stress. When this is not possible use some of the previous suggestions to help reduce the anxiety that accompanies stressful situations. MAKE SURE YOU:   Understand these instructions.  Will watch your condition.  Will get help right away if you are not doing well or get worse. Document Released: 01/16/2000 Document Revised: 04/12/2011 Document Reviewed: 02/15/2007 [employer] Patient Information 2015 [employer]. This information is not intended to replace advice given to you by your health care provider. Make sure you discuss any questions you have with your health care provider.  

## 2014-02-28 NOTE — Progress Notes (Signed)
Pre visit review using our clinic review tool, if applicable. No additional management support is needed unless otherwise documented below in the visit note. 

## 2014-03-03 ENCOUNTER — Encounter: Payer: Self-pay | Admitting: Internal Medicine

## 2014-03-03 NOTE — Assessment & Plan Note (Signed)
Will add silenor to the xanax to help him with the frequent awakenings

## 2014-03-03 NOTE — Assessment & Plan Note (Signed)
His BP is well controlled Lytes and renal function are stable 

## 2014-03-03 NOTE — Progress Notes (Signed)
   Subjective:    Patient ID: Mark Jordan, male    DOB: 12-21-1940, 74 y.o.   MRN: [id number]  HPI    Review of Systems     Objective:   Physical Exam        Assessment & Plan:
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
## 2014-03-03 NOTE — Progress Notes (Signed)
Subjective:    Patient ID: Mark Jordan, male    DOB: October 10, 1940, 74 y.o.   MRN: [id number]  Gastrophageal Reflux He complains of belching and heartburn. He reports no abdominal pain, no chest pain, no choking, no coughing, no dysphagia, no early satiety, no globus sensation, no hoarse voice, no nausea, no sore throat, no stridor, no tooth decay, no water brash or no wheezing. This is a chronic problem. The current episode started more than 1 year ago. The problem occurs occasionally. The problem has been unchanged. The heartburn duration is less than a minute. The heartburn is of mild intensity. The heartburn does not wake him from sleep. The heartburn does not limit his activity. The heartburn doesn't change with position. Pertinent negatives include no anemia, fatigue, melena, muscle weakness, orthopnea or weight loss. He has tried a PPI for the symptoms. The treatment provided significant relief.      Review of Systems  Constitutional: Negative.  Negative for fever, chills, weight loss, diaphoresis, appetite change and fatigue.  HENT: Negative.  Negative for hoarse voice and sore throat.   Eyes: Negative.   Respiratory: Negative.  Negative for cough, choking and wheezing.   Cardiovascular: Negative.  Negative for chest pain, palpitations and leg swelling.  Gastrointestinal: Positive for heartburn. Negative for dysphagia, nausea, abdominal pain, diarrhea, constipation, blood in stool and melena.  Endocrine: Negative.   Genitourinary: Negative.  Negative for urgency, hematuria, flank pain, decreased urine volume and difficulty urinating.  Musculoskeletal: Negative.  Negative for myalgias, back pain, arthralgias and muscle weakness.  Skin: Negative.   Allergic/Immunologic: Negative.   Neurological: Negative.   Hematological: Negative.  Negative for adenopathy. Does not bruise/bleed easily.  Psychiatric/Behavioral: Positive for sleep disturbance. Negative for suicidal ideas,  hallucinations, behavioral problems, confusion, self-injury, dysphoric mood, decreased concentration and agitation. The patient is not nervous/anxious and is not hyperactive.        He complains of persistent insomnia - he tells me that xanax helps him fall asleep but then he wakes up frequently during the night.       Objective:   Physical Exam  Constitutional: He is oriented to person, place, and time. He appears well-developed and well-nourished. No distress.  HENT:  Head: Normocephalic and atraumatic.  Mouth/Throat: Oropharynx is clear and moist. No oropharyngeal exudate.  Eyes: Conjunctivae are normal. Right eye exhibits no discharge. Left eye exhibits no discharge. No scleral icterus.  Neck: Normal range of motion. Neck supple. No JVD present. No tracheal deviation present. No thyromegaly present.  Cardiovascular: Normal rate, regular rhythm, normal heart sounds and intact distal pulses.  Exam reveals no gallop and no friction rub.   No murmur heard. Pulmonary/Chest: Effort normal and breath sounds normal. No stridor. No respiratory distress. He has no wheezes. He has no rales. He exhibits no tenderness.  Abdominal: Soft. Bowel sounds are normal. He exhibits no distension and no mass. There is no tenderness. There is no rebound and no guarding.  Musculoskeletal: Normal range of motion. He exhibits no edema or tenderness.  Lymphadenopathy:    He has no cervical adenopathy.  Neurological: He is oriented to person, place, and time.  Skin: Skin is warm and dry. No rash noted. He is not diaphoretic. No erythema. No pallor.  Vitals reviewed.    Lab Results  Component Value Date   WBC 6.0 01/06/2014   HGB 13.9 01/06/2014   HCT 42 01/06/2014   PLT 213 01/06/2014   GLUCOSE 89 10/29/2013   CHOL 176  08/27/2013   TRIG 111.0 08/27/2013   HDL 33.50* 08/27/2013   LDLDIRECT 144.2 02/07/2013   LDLCALC 120* 08/27/2013   ALT 18 10/29/2013   AST 27 01/06/2014   NA 143 01/06/2014   K 4.0  01/06/2014   CL 104 10/29/2013   CREATININE 0.70 01/06/2014   BUN 26* 01/06/2014   CO2 27 10/29/2013   TSH 2.60 10/29/2013   PSA 0.80 02/07/2013   INR 0.97 09/10/2009       Assessment & Plan:

## 2014-07-29 ENCOUNTER — Other Ambulatory Visit: Payer: Self-pay

## 2014-09-03 ENCOUNTER — Telehealth: Payer: Self-pay | Admitting: Internal Medicine

## 2014-09-03 DIAGNOSIS — I10 Essential (primary) hypertension: Secondary | ICD-10-CM

## 2014-09-03 MED ORDER — AMLODIPINE BESY-BENAZEPRIL HCL 10-20 MG PO CAPS: 1.0000 | ORAL_CAPSULE | Freq: Every day | ORAL | Status: DC

## 2014-09-03 NOTE — Telephone Encounter (Signed)
Patient is requesting lotrel to be sent to [facility] in [place]

## 2014-09-03 NOTE — Telephone Encounter (Signed)
Notified pt md sent rx to [facility]...Mark Jordan

## 2014-09-03 NOTE — Telephone Encounter (Signed)
done

## 2014-09-03 NOTE — Telephone Encounter (Signed)
Lotrel has been removed from med list. Does pt need to take, and if so is it ok to refill...Johny Chess

## 2014-09-20 ENCOUNTER — Other Ambulatory Visit: Payer: Self-pay | Admitting: Internal Medicine

## 2014-10-03 ENCOUNTER — Other Ambulatory Visit: Payer: Self-pay | Admitting: Internal Medicine

## 2014-11-01 ENCOUNTER — Ambulatory Visit (INDEPENDENT_AMBULATORY_CARE_PROVIDER_SITE_OTHER): Payer: Medicare Other | Admitting: Internal Medicine

## 2014-11-01 ENCOUNTER — Encounter: Payer: Self-pay | Admitting: Internal Medicine

## 2014-11-01 ENCOUNTER — Other Ambulatory Visit (INDEPENDENT_AMBULATORY_CARE_PROVIDER_SITE_OTHER): Payer: Medicare Other

## 2014-11-01 VITALS — BP 122/64 | HR 61 | Temp 97.8°F | Resp 14 | Ht 68.5 in | Wt 152.0 lb

## 2014-11-01 DIAGNOSIS — R739 Hyperglycemia, unspecified: Secondary | ICD-10-CM

## 2014-11-01 DIAGNOSIS — K219 Gastro-esophageal reflux disease without esophagitis: Secondary | ICD-10-CM | POA: Diagnosis not present

## 2014-11-01 DIAGNOSIS — R7303 Prediabetes: Secondary | ICD-10-CM | POA: Insufficient documentation

## 2014-11-01 DIAGNOSIS — E7800 Pure hypercholesterolemia, unspecified: Secondary | ICD-10-CM

## 2014-11-01 DIAGNOSIS — Z23 Encounter for immunization: Secondary | ICD-10-CM | POA: Diagnosis not present

## 2014-11-01 DIAGNOSIS — I10 Essential (primary) hypertension: Secondary | ICD-10-CM

## 2014-11-01 LAB — BASIC METABOLIC PANEL
BUN: 20 mg/dL (ref 6–23)
CALCIUM: 9.4 mg/dL (ref 8.4–10.5)
CHLORIDE: 106 meq/L (ref 96–112)
CO2: 31 meq/L (ref 19–32)
Creatinine, Ser: 0.68 mg/dL (ref 0.40–1.50)
GFR: 120.41 mL/min (ref >60.00)
Glucose, Bld: 100 mg/dL — ABNORMAL HIGH (ref 70–99)
POTASSIUM: 4.4 meq/L (ref 3.5–5.1)
SODIUM: 142 meq/L (ref 135–145)

## 2014-11-01 LAB — CBC WITH DIFFERENTIAL/PLATELET
BASOS ABS: 0 10*3/uL (ref 0.0–0.1)
Basophils Relative: 0.4 % (ref 0.0–3.0)
EOS PCT: 3 % (ref 0.0–5.0)
Eosinophils Absolute: 0.2 10*3/uL (ref 0.0–0.7)
HCT: 44.8 % (ref 39.0–52.0)
Hemoglobin: 15.2 g/dL (ref 13.0–17.0)
LYMPHS ABS: 1.7 10*3/uL (ref 0.7–4.0)
Lymphocytes Relative: 32.3 % (ref 12.0–46.0)
MCHC: 33.9 g/dL (ref 30.0–36.0)
MCV: 89.4 fl (ref 78.0–100.0)
MONO ABS: 0.5 10*3/uL (ref 0.1–1.0)
Monocytes Relative: 8.7 % (ref 3.0–12.0)
NEUTROS PCT: 55.6 % (ref 43.0–77.0)
Neutro Abs: 2.9 10*3/uL (ref 1.4–7.7)
PLATELETS: 186 10*3/uL (ref 150.0–400.0)
RBC: 5.01 Mil/uL (ref 4.22–5.81)
RDW: 14.1 % (ref 11.5–15.5)
WBC: 5.3 10*3/uL (ref 4.0–10.5)

## 2014-11-01 LAB — LIPID PANEL
CHOLESTEROL: 164 mg/dL (ref 0–200)
HDL: 42.9 mg/dL (ref >39.00)
LDL CALC: 114 mg/dL — AB (ref 0–99)
NonHDL: 121.2
Total CHOL/HDL Ratio: 4
Triglycerides: 36 mg/dL (ref 0.0–149.0)
VLDL: 7.2 mg/dL (ref 0.0–40.0)

## 2014-11-01 LAB — HEPATIC FUNCTION PANEL
ALT: 13 U/L (ref 0–53)
AST: 15 U/L (ref 0–37)
Albumin: 4.2 g/dL (ref 3.5–5.2)
Alkaline Phosphatase: 48 U/L (ref 39–117)
BILIRUBIN TOTAL: 0.8 mg/dL (ref 0.2–1.2)
Bilirubin, Direct: 0.2 mg/dL (ref 0.0–0.3)
Total Protein: 7.3 g/dL (ref 6.0–8.3)

## 2014-11-01 LAB — TSH: TSH: 1.68 u[IU]/mL (ref 0.35–4.50)

## 2014-11-01 LAB — HEMOGLOBIN A1C: Hgb A1c MFr Bld: 5.9 % (ref 4.6–6.5)

## 2014-11-01 MED ORDER — ALPRAZOLAM 0.5 MG PO TABS
ORAL_TABLET | ORAL | Status: DC
Start: 2014-11-01 — End: 2015-07-03

## 2014-11-01 NOTE — Assessment & Plan Note (Signed)
CBC

## 2014-11-01 NOTE — Assessment & Plan Note (Signed)
Lipids, LFTs, TSH  

## 2014-11-01 NOTE — Progress Notes (Signed)
Pre visit review using our clinic review tool, if applicable. No additional management support is needed unless otherwise documented below in the visit note. 

## 2014-11-01 NOTE — Assessment & Plan Note (Signed)
A1c

## 2014-11-01 NOTE — Patient Instructions (Addendum)
To prevent sleep dysfunction follow these instructions for sleep hygiene. Do not read, watch TV, or eat in bed. Do not get into bed until you are ready to turn off the light &  to go to sleep. Do not ingest stimulants ( decongestants, diet pills, nicotine, caffeine) after the evening meal.Do not take daytime naps.Cardiovascular exercise, this can be as simple a program as walking, is recommended 30-45 minutes 3-4 times per week. If you're not exercising you should take 6-8 weeks to build up to this level.  Reflux of gastric acid may be asymptomatic as this may occur mainly during sleep.The triggers for reflux  include stress; the "aspirin family" ; alcohol; peppermint; and caffeine (coffee, tea, cola, and chocolate). The aspirin family would include aspirin and the nonsteroidal agents such as ibuprofen &  Naproxen. Tylenol would not cause reflux. If having symptoms ; food & drink should be avoided for @ least 2 hours before going to bed.   Minimal Blood Pressure Goal= AVERAGE < 140/90;  Ideal is an AVERAGE < 135/85. This AVERAGE should be calculated from @ least 5-7 BP readings taken @ different times of day on different days of week. You should not respond to isolated BP readings , but rather the AVERAGE for that week .Please bring your  blood pressure cuff to office visits to verify that it is reliable.It  can also be checked against the blood pressure device at the pharmacy. Finger or wrist cuffs are not dependable; an arm cuff is.  Your next office appointment will be determined based upon review of your pending labs  and  xrays  Those written interpretation of the lab results and instructions will be transmitted to you by mail for your records.  Critical results will be called.   Followup as needed for any active or acute issue. Please report any significant change in your symptoms.

## 2014-11-01 NOTE — Progress Notes (Signed)
   Subjective:    Patient ID: Mark Jordan, male    DOB: 1940-03-31, 74 y.o.   MRN: [id number]  HPI The patient is here to assess status of active health conditions.  PMH, FH, & Social History reviewed & updated.FH updated.  He's been compliant with his medicines without adverse effects. He rarely eats red meat; he does have some fried foods. He [place] physically [place]. He has no associated cardiopulmonary symptoms with this activity.  Colonoscopy was completed in 2014; this was negative.  He has Urologic follow-up with Dr. Rosana Hoes in the near future.  He does have occasional gas pains which he relates to certain foods such as slaw & salads. He takes Zantac 150 mg over-the-counter with good control of his reflux symptoms  He has had some sleep disruption. His wife died in August 18, 2022. He does take low-dose Xanax at bedtime as needed with benefit. He does not take this during the day or on a regular basis. He's also on gabapentin 100 mg at bedtime.  Review of Systems  Chest pain, palpitations, tachycardia, exertional dyspnea, paroxysmal nocturnal dyspnea, claudication or edema are absent. No unexplained weight loss, abdominal pain, significant dyspepsia, dysphagia, melena, rectal bleeding, or persistently small caliber stools. Dysuria, pyuria, hematuria, frequency, nocturia or polyuria are denied. Change in hair, skin, nails denied. No bowel changes of constipation or diarrhea. No intolerance to heat or cold.     Objective:   Physical Exam  Pertinent or positive findings include:Decreased hearing on L.Mixed DIP/PIP arthritic changes.Crepitus knees. General appearance :adequately nourished; in no distress.  Eyes: Ptosis bilaterally.No conjunctival inflammation or scleral icterus is present.  Oral exam:  Lips and gums are healthy appearing.There is no oropharyngeal erythema or exudate noted. Dental hygiene is good.  Heart:  Slow rate and regular rhythm. S1 and S2 normal without  gallop, murmur, click,or rub .S4    Lungs:Chest clear to auscultation; no wheezes, rhonchi,rales ,or rubs present.No increased work of breathing.   Abdomen: bowel sounds normal, soft and non-tender without masses, organomegaly or hernias noted.  No guarding or rebound. No flank tenderness to percussion.  Vascular : all pulses equal ; no bruits present.  Skin:Warm & dry.  Intact without suspicious lesions or rashes ; no tenting or jaundice   Lymphatic: No lymphadenopathy is noted about the head, neck, axilla, or inguinal areas.   Neuro: Strength, tone & DTRs normal.     Assessment & Plan:  See Current Assessment & Plan in Problem List under specific Diagnosis

## 2014-11-01 NOTE — Assessment & Plan Note (Signed)
Blood pressure goals reviewed. BMET 

## 2014-11-20 ENCOUNTER — Encounter: Payer: Medicare Other | Admitting: Internal Medicine

## 2015-03-12 ENCOUNTER — Encounter: Payer: Self-pay | Admitting: Internal Medicine

## 2015-03-12 ENCOUNTER — Other Ambulatory Visit (INDEPENDENT_AMBULATORY_CARE_PROVIDER_SITE_OTHER): Payer: PPO

## 2015-03-12 ENCOUNTER — Ambulatory Visit (INDEPENDENT_AMBULATORY_CARE_PROVIDER_SITE_OTHER): Payer: PPO | Admitting: Internal Medicine

## 2015-03-12 VITALS — BP 110/78 | HR 73 | Temp 97.8°F | Resp 16 | Ht 68.5 in | Wt 158.0 lb

## 2015-03-12 DIAGNOSIS — K2100 Gastro-esophageal reflux disease with esophagitis, without bleeding: Secondary | ICD-10-CM

## 2015-03-12 DIAGNOSIS — I10 Essential (primary) hypertension: Secondary | ICD-10-CM | POA: Diagnosis not present

## 2015-03-12 DIAGNOSIS — Z23 Encounter for immunization: Secondary | ICD-10-CM | POA: Diagnosis not present

## 2015-03-12 DIAGNOSIS — K4090 Unilateral inguinal hernia, without obstruction or gangrene, not specified as recurrent: Secondary | ICD-10-CM | POA: Diagnosis not present

## 2015-03-12 DIAGNOSIS — K469 Unspecified abdominal hernia without obstruction or gangrene: Secondary | ICD-10-CM | POA: Insufficient documentation

## 2015-03-12 LAB — CBC WITH DIFFERENTIAL/PLATELET
BASOS ABS: 0 10*3/uL (ref 0.0–0.1)
Basophils Relative: 0.6 % (ref 0.0–3.0)
EOS ABS: 0.1 10*3/uL (ref 0.0–0.7)
Eosinophils Relative: 2 % (ref 0.0–5.0)
HEMATOCRIT: 44.9 % (ref 39.0–52.0)
HEMOGLOBIN: 14.9 g/dL (ref 13.0–17.0)
LYMPHS PCT: 23.6 % (ref 12.0–46.0)
Lymphs Abs: 1.6 10*3/uL (ref 0.7–4.0)
MCHC: 33.2 g/dL (ref 30.0–36.0)
MCV: 91.4 fl (ref 78.0–100.0)
MONO ABS: 0.7 10*3/uL (ref 0.1–1.0)
Monocytes Relative: 9.9 % (ref 3.0–12.0)
Neutro Abs: 4.3 10*3/uL (ref 1.4–7.7)
Neutrophils Relative %: 63.9 % (ref 43.0–77.0)
Platelets: 207 10*3/uL (ref 150.0–400.0)
RBC: 4.92 Mil/uL (ref 4.22–5.81)
RDW: 13.2 % (ref 11.5–15.5)
WBC: 6.7 10*3/uL (ref 4.0–10.5)

## 2015-03-12 LAB — BASIC METABOLIC PANEL
BUN: 20 mg/dL (ref 6–23)
CHLORIDE: 106 meq/L (ref 96–112)
CO2: 29 mEq/L (ref 19–32)
Calcium: 9.1 mg/dL (ref 8.4–10.5)
Creatinine, Ser: 0.7 mg/dL (ref 0.40–1.50)
GFR: 116.34 mL/min (ref >60.00)
GLUCOSE: 99 mg/dL (ref 70–99)
POTASSIUM: 3.9 meq/L (ref 3.5–5.1)
SODIUM: 140 meq/L (ref 135–145)

## 2015-03-12 MED ORDER — OMEPRAZOLE 40 MG PO CPDR
40.0000 mg | DELAYED_RELEASE_CAPSULE | Freq: Every day | ORAL | Status: DC
Start: 2015-03-12 — End: 2016-03-10

## 2015-03-12 NOTE — Progress Notes (Signed)
Pre visit review using our clinic review tool, if applicable. No additional management support is needed unless otherwise documented below in the visit note. 

## 2015-03-12 NOTE — Patient Instructions (Signed)

## 2015-03-12 NOTE — Progress Notes (Signed)
Subjective:  Patient ID: Mark Jordan, male    DOB: 1940-07-25  Age: 75 y.o. MRN: [id number]  CC: Abdominal Pain and Gastroesophageal Reflux   HPI Mark Jordan presents for the complaint of right lower quadrant abdominal pain for about for 5 days. He has an intermittent achy sensation in his right groin. He has not seen a felt a mass or bulge. His only other abdominal symptoms is of heartburn, indigestion, burning and belching. He tells me he has a history of peptic ulcer disease and is trying to control his symptoms with an over-the-counter dose of an H2 blocker and he says that is not controlling his symptoms and he wants to try something stronger.  Outpatient Prescriptions Prior to Visit  Medication Sig Dispense Refill  . ALPRAZolam (XANAX) 0.5 MG tablet 1/2-1 qhs prn only 30 tablet 0  . amLODipine-benazepril (LOTREL) 10-20 MG per capsule Take 1 capsule by mouth daily. 30 capsule 5  . gabapentin (NEURONTIN) 300 MG capsule TAKE ONE CAPSULE BY MOUTH AT BEDTIME (Patient taking differently: TAKE ONE CAPSULE BY MOUTH AT BEDTIME prn) 30 capsule 11  . omega-3 acid ethyl esters (LOVAZA) 1 G capsule Take 2 capsules (2 g total) by mouth 2 (two) times daily. 120 capsule 11  . Tamsulosin HCl (FLOMAX) 0.4 MG CAPS Take 0.4 mg by mouth daily after supper.     . vitamin B-12 (CYANOCOBALAMIN) 1000 MCG tablet Take 1,000 mcg by mouth daily. Reported on 03/12/2015     No facility-administered medications prior to visit.    ROS Review of Systems  Constitutional: Negative.  Negative for fever, chills, activity change, appetite change and fatigue.  HENT: Negative.  Negative for sinus pressure, sore throat, trouble swallowing and voice change.   Eyes: Negative.   Respiratory: Negative.  Negative for cough, choking, chest tightness, shortness of breath and stridor.   Cardiovascular: Negative.  Negative for chest pain, palpitations and leg swelling.  Gastrointestinal: Positive for abdominal pain. Negative  for nausea, vomiting, diarrhea, constipation, blood in stool, abdominal distention, anal bleeding and rectal pain.  Endocrine: Negative.   Genitourinary: Negative.  Negative for dysuria, urgency, frequency, hematuria, flank pain, scrotal swelling, difficulty urinating and testicular pain.  Musculoskeletal: Negative.  Negative for myalgias, back pain, joint swelling and arthralgias.  Skin: Negative.  Negative for color change and rash.  Allergic/Immunologic: Negative.   Neurological: Negative.   Hematological: Negative.  Negative for adenopathy. Does not bruise/bleed easily.  Psychiatric/Behavioral: Negative.     Objective:  BP 110/78 mmHg  Pulse 73  Temp(Src) 97.8 F (36.6 C) (Oral)  Resp 16  Ht 5' 8.5" (1.74 m)  Wt 158 lb (71.668 kg)  BMI 23.67 kg/m2  SpO2 94%  BP Readings from Last 3 Encounters:  03/12/15 110/78  11/01/14 122/64  02/28/14 128/68    Wt Readings from Last 3 Encounters:  03/12/15 158 lb (71.668 kg)  11/01/14 152 lb (68.947 kg)  02/28/14 152 lb (68.947 kg)    Physical Exam  Constitutional:  Non-toxic appearance. He does not have a sickly appearance. He does not appear ill. No distress.  HENT:  Mouth/Throat: Oropharynx is clear and moist. No oropharyngeal exudate.  Eyes: Conjunctivae are normal. Right eye exhibits no discharge. Left eye exhibits no discharge. No scleral icterus.  Neck: Normal range of motion. Neck supple. No JVD present. No tracheal deviation present. No thyromegaly present.  Cardiovascular: Normal rate, regular rhythm, normal heart sounds and intact distal pulses.  Exam reveals no gallop and no friction  rub.   No murmur heard. Pulmonary/Chest: Effort normal and breath sounds normal. No stridor. No respiratory distress. He has no wheezes. He has no rales. He exhibits no tenderness.  Abdominal: Soft. Normal appearance and bowel sounds are normal. He exhibits no distension and no mass. There is no hepatosplenomegaly, splenomegaly or  hepatomegaly. There is no tenderness. There is no rebound, no guarding and no CVA tenderness. A hernia is present. Hernia confirmed positive in the right inguinal area. Hernia confirmed negative in the ventral area and confirmed negative in the left inguinal area.  Lymphadenopathy:    He has no cervical adenopathy.       Right: No inguinal adenopathy present.       Left: No inguinal adenopathy present.  Skin: He is not diaphoretic.  Vitals reviewed.   Lab Results  Component Value Date   WBC 6.7 03/12/2015   HGB 14.9 03/12/2015   HCT 44.9 03/12/2015   PLT 207.0 03/12/2015   GLUCOSE 99 03/12/2015   CHOL 164 11/01/2014   TRIG 36.0 11/01/2014   HDL 42.90 11/01/2014   LDLDIRECT 144.2 02/07/2013   LDLCALC 114* 11/01/2014   ALT 13 11/01/2014   AST 15 11/01/2014   NA 140 03/12/2015   K 3.9 03/12/2015   CL 106 03/12/2015   CREATININE 0.70 03/12/2015   BUN 20 03/12/2015   CO2 29 03/12/2015   TSH 1.68 11/01/2014   PSA 0.80 02/07/2013   INR 0.97 09/10/2009   HGBA1C 5.9 11/01/2014    Ct Abdomen Pelvis W Contrast  01/30/2014  CLINICAL DATA:  Unexplained weight loss. EXAM: CT ABDOMEN AND PELVIS WITH CONTRAST TECHNIQUE: Multidetector CT imaging of the abdomen and pelvis was performed using the standard protocol following bolus administration of intravenous contrast. CONTRAST:  163mL OMNIPAQUE IOHEXOL 300 MG/ML  SOLN COMPARISON:  Included portions from chest CT 01/06/2014 and 03/01/2013 FINDINGS: A peripheral wedge-shaped density in the lingula is new from prior exam and likely atelectasis. Small to moderate hiatal hernia. The liver, gallbladder, spleen, and adrenal glands appear normal. There is a 2 mm nonobstructing stone in the mid right kidney. A 17 mm hypodensity in the upper right kidney, slightly higher than simple fluid density but unchanged in size compared to prior. No focal abnormality of the left kidney. Symmetric renal enhancement and excretion. There are no dilated or thickened  small bowel loops. There is a moderate volume of stool throughout the entire colon. Multiple colonic diverticula are seen without diverticulitis. No colonic wall thickening. The appendix is normal. The abdominal aorta is normal in caliber. There is no retroperitoneal adenopathy. No free air, free fluid, or intra-abdominal fluid collection. Within the pelvis the urinary bladder is distended. There are multiple bladder diverticula including right lateral diverticulum measuring 2.5 cm, 1.8 cm left lateral diverticula at the dome. There is questionable associated wall thickening in the dome of the bladder. Prostate gland appears enlarged measuring 6.1 x 3.9 x 3.9 cm. Postsurgical change left inguinal region. There is no pelvic free fluid or adenopathy. Multilevel degenerative change in the spine most significant at the lumbosacral junction with facet arthropathy. There is degenerative change of both hips. Probable underlying osteopenia. No suspicious osseous lesion. IMPRESSION: 1. Mildly distended urinary bladder with multiple bladder diverticula. There is equivocal bladder wall thickening in the dome. Findings can be seen in a setting of chronic bladder outlet obstruction, however direct visualization may be helpful to exclude underlying lesion given history of weight loss. 2. Nonobstructing right renal stone.  Right renal cyst.  3. Colonic diverticulosis without diverticulitis. Moderate stool throughout the colon can be seen in the setting of constipation. 4. Lingular opacity is new from chest CT 2 weeks prior and likely atelectasis. Moderate hiatal hernia. Electronically Signed   By: Jeb Levering M.D.   On: 01/30/2014 13:42    Assessment & Plan:   Zayon was seen today for abdominal pain and gastroesophageal reflux.  Diagnoses and all orders for this visit:  Need for vaccination with 13-polyvalent pneumococcal conjugate vaccine -     Pneumococcal conjugate vaccine 13-valent  Essential hypertension- his  blood pressure is well-controlled, electrolytes and renal function are stable. -     CBC with Differential/Platelet; Future -     Basic metabolic panel; Future  Gastroesophageal reflux disease with esophagitis- he has no atypical symptoms and he is not anemic, he will discontinue the H2 blocker will try to get better symptom relief with the PPI. -     omeprazole (PRILOSEC) 40 MG capsule; Take 1 capsule (40 mg total) by mouth daily. -     CBC with Differential/Platelet; Future  Unilateral inguinal hernia without obstruction or gangrene, recurrence not specified- his symptoms and exam do not raise any concerns for an acute abdominal process but I do detect a direct right inguinal hernia, I've asked him to see general surgery to consider having this operated on. -     Ambulatory referral to General Surgery   I am having Mark Jordan start on omeprazole. I am also having him maintain his tamsulosin, vitamin B-12, omega-3 acid ethyl esters, amLODipine-benazepril, gabapentin, and ALPRAZolam.  Meds ordered this encounter  Medications  . omeprazole (PRILOSEC) 40 MG capsule    Sig: Take 1 capsule (40 mg total) by mouth daily.    Dispense:  90 capsule    Refill:  3     Follow-up: Return in about 6 months (around 09/09/2015).  Scarlette Calico, MD

## 2015-03-28 DIAGNOSIS — K4090 Unilateral inguinal hernia, without obstruction or gangrene, not specified as recurrent: Secondary | ICD-10-CM | POA: Diagnosis not present

## 2015-04-30 DIAGNOSIS — H524 Presbyopia: Secondary | ICD-10-CM | POA: Diagnosis not present

## 2015-04-30 DIAGNOSIS — H52222 Regular astigmatism, left eye: Secondary | ICD-10-CM | POA: Diagnosis not present

## 2015-04-30 DIAGNOSIS — H5203 Hypermetropia, bilateral: Secondary | ICD-10-CM | POA: Diagnosis not present

## 2015-04-30 DIAGNOSIS — H25813 Combined forms of age-related cataract, bilateral: Secondary | ICD-10-CM | POA: Diagnosis not present

## 2015-05-23 DIAGNOSIS — R194 Change in bowel habit: Secondary | ICD-10-CM | POA: Diagnosis not present

## 2015-06-10 DIAGNOSIS — M5442 Lumbago with sciatica, left side: Secondary | ICD-10-CM | POA: Diagnosis not present

## 2015-06-10 DIAGNOSIS — M546 Pain in thoracic spine: Secondary | ICD-10-CM | POA: Diagnosis not present

## 2015-06-10 DIAGNOSIS — M5137 Other intervertebral disc degeneration, lumbosacral region: Secondary | ICD-10-CM | POA: Diagnosis not present

## 2015-06-10 DIAGNOSIS — M5136 Other intervertebral disc degeneration, lumbar region: Secondary | ICD-10-CM | POA: Diagnosis not present

## 2015-06-10 DIAGNOSIS — M9902 Segmental and somatic dysfunction of thoracic region: Secondary | ICD-10-CM | POA: Diagnosis not present

## 2015-06-10 DIAGNOSIS — M9903 Segmental and somatic dysfunction of lumbar region: Secondary | ICD-10-CM | POA: Diagnosis not present

## 2015-06-10 DIAGNOSIS — M9904 Segmental and somatic dysfunction of sacral region: Secondary | ICD-10-CM | POA: Diagnosis not present

## 2015-06-11 DIAGNOSIS — M9904 Segmental and somatic dysfunction of sacral region: Secondary | ICD-10-CM | POA: Diagnosis not present

## 2015-06-11 DIAGNOSIS — M9903 Segmental and somatic dysfunction of lumbar region: Secondary | ICD-10-CM | POA: Diagnosis not present

## 2015-06-11 DIAGNOSIS — M5136 Other intervertebral disc degeneration, lumbar region: Secondary | ICD-10-CM | POA: Diagnosis not present

## 2015-06-11 DIAGNOSIS — M5442 Lumbago with sciatica, left side: Secondary | ICD-10-CM | POA: Diagnosis not present

## 2015-06-11 DIAGNOSIS — M546 Pain in thoracic spine: Secondary | ICD-10-CM | POA: Diagnosis not present

## 2015-06-11 DIAGNOSIS — M9902 Segmental and somatic dysfunction of thoracic region: Secondary | ICD-10-CM | POA: Diagnosis not present

## 2015-06-11 DIAGNOSIS — M5137 Other intervertebral disc degeneration, lumbosacral region: Secondary | ICD-10-CM | POA: Diagnosis not present

## 2015-06-12 DIAGNOSIS — M9902 Segmental and somatic dysfunction of thoracic region: Secondary | ICD-10-CM | POA: Diagnosis not present

## 2015-06-12 DIAGNOSIS — M9904 Segmental and somatic dysfunction of sacral region: Secondary | ICD-10-CM | POA: Diagnosis not present

## 2015-06-12 DIAGNOSIS — M546 Pain in thoracic spine: Secondary | ICD-10-CM | POA: Diagnosis not present

## 2015-06-12 DIAGNOSIS — M5137 Other intervertebral disc degeneration, lumbosacral region: Secondary | ICD-10-CM | POA: Diagnosis not present

## 2015-06-12 DIAGNOSIS — M5442 Lumbago with sciatica, left side: Secondary | ICD-10-CM | POA: Diagnosis not present

## 2015-06-12 DIAGNOSIS — M5136 Other intervertebral disc degeneration, lumbar region: Secondary | ICD-10-CM | POA: Diagnosis not present

## 2015-06-12 DIAGNOSIS — M9903 Segmental and somatic dysfunction of lumbar region: Secondary | ICD-10-CM | POA: Diagnosis not present

## 2015-06-13 DIAGNOSIS — M9904 Segmental and somatic dysfunction of sacral region: Secondary | ICD-10-CM | POA: Diagnosis not present

## 2015-06-13 DIAGNOSIS — M5136 Other intervertebral disc degeneration, lumbar region: Secondary | ICD-10-CM | POA: Diagnosis not present

## 2015-06-13 DIAGNOSIS — M546 Pain in thoracic spine: Secondary | ICD-10-CM | POA: Diagnosis not present

## 2015-06-13 DIAGNOSIS — M5137 Other intervertebral disc degeneration, lumbosacral region: Secondary | ICD-10-CM | POA: Diagnosis not present

## 2015-06-13 DIAGNOSIS — M9902 Segmental and somatic dysfunction of thoracic region: Secondary | ICD-10-CM | POA: Diagnosis not present

## 2015-06-13 DIAGNOSIS — M9903 Segmental and somatic dysfunction of lumbar region: Secondary | ICD-10-CM | POA: Diagnosis not present

## 2015-06-13 DIAGNOSIS — M5442 Lumbago with sciatica, left side: Secondary | ICD-10-CM | POA: Diagnosis not present

## 2015-06-16 DIAGNOSIS — M5136 Other intervertebral disc degeneration, lumbar region: Secondary | ICD-10-CM | POA: Diagnosis not present

## 2015-06-16 DIAGNOSIS — M5137 Other intervertebral disc degeneration, lumbosacral region: Secondary | ICD-10-CM | POA: Diagnosis not present

## 2015-06-16 DIAGNOSIS — M546 Pain in thoracic spine: Secondary | ICD-10-CM | POA: Diagnosis not present

## 2015-06-16 DIAGNOSIS — M5442 Lumbago with sciatica, left side: Secondary | ICD-10-CM | POA: Diagnosis not present

## 2015-06-16 DIAGNOSIS — M9904 Segmental and somatic dysfunction of sacral region: Secondary | ICD-10-CM | POA: Diagnosis not present

## 2015-06-16 DIAGNOSIS — M9903 Segmental and somatic dysfunction of lumbar region: Secondary | ICD-10-CM | POA: Diagnosis not present

## 2015-06-16 DIAGNOSIS — M9902 Segmental and somatic dysfunction of thoracic region: Secondary | ICD-10-CM | POA: Diagnosis not present

## 2015-06-18 DIAGNOSIS — M9903 Segmental and somatic dysfunction of lumbar region: Secondary | ICD-10-CM | POA: Diagnosis not present

## 2015-06-18 DIAGNOSIS — M546 Pain in thoracic spine: Secondary | ICD-10-CM | POA: Diagnosis not present

## 2015-06-18 DIAGNOSIS — M5137 Other intervertebral disc degeneration, lumbosacral region: Secondary | ICD-10-CM | POA: Diagnosis not present

## 2015-06-18 DIAGNOSIS — M5442 Lumbago with sciatica, left side: Secondary | ICD-10-CM | POA: Diagnosis not present

## 2015-06-18 DIAGNOSIS — M9902 Segmental and somatic dysfunction of thoracic region: Secondary | ICD-10-CM | POA: Diagnosis not present

## 2015-06-18 DIAGNOSIS — M5136 Other intervertebral disc degeneration, lumbar region: Secondary | ICD-10-CM | POA: Diagnosis not present

## 2015-06-18 DIAGNOSIS — M9904 Segmental and somatic dysfunction of sacral region: Secondary | ICD-10-CM | POA: Diagnosis not present

## 2015-06-19 DIAGNOSIS — M9902 Segmental and somatic dysfunction of thoracic region: Secondary | ICD-10-CM | POA: Diagnosis not present

## 2015-06-19 DIAGNOSIS — M5136 Other intervertebral disc degeneration, lumbar region: Secondary | ICD-10-CM | POA: Diagnosis not present

## 2015-06-19 DIAGNOSIS — M9903 Segmental and somatic dysfunction of lumbar region: Secondary | ICD-10-CM | POA: Diagnosis not present

## 2015-06-19 DIAGNOSIS — M5137 Other intervertebral disc degeneration, lumbosacral region: Secondary | ICD-10-CM | POA: Diagnosis not present

## 2015-06-19 DIAGNOSIS — M9904 Segmental and somatic dysfunction of sacral region: Secondary | ICD-10-CM | POA: Diagnosis not present

## 2015-06-19 DIAGNOSIS — M546 Pain in thoracic spine: Secondary | ICD-10-CM | POA: Diagnosis not present

## 2015-06-19 DIAGNOSIS — M5442 Lumbago with sciatica, left side: Secondary | ICD-10-CM | POA: Diagnosis not present

## 2015-06-20 DIAGNOSIS — M5136 Other intervertebral disc degeneration, lumbar region: Secondary | ICD-10-CM | POA: Diagnosis not present

## 2015-06-20 DIAGNOSIS — M9902 Segmental and somatic dysfunction of thoracic region: Secondary | ICD-10-CM | POA: Diagnosis not present

## 2015-06-20 DIAGNOSIS — M546 Pain in thoracic spine: Secondary | ICD-10-CM | POA: Diagnosis not present

## 2015-06-20 DIAGNOSIS — M9903 Segmental and somatic dysfunction of lumbar region: Secondary | ICD-10-CM | POA: Diagnosis not present

## 2015-06-20 DIAGNOSIS — M5137 Other intervertebral disc degeneration, lumbosacral region: Secondary | ICD-10-CM | POA: Diagnosis not present

## 2015-06-20 DIAGNOSIS — M9904 Segmental and somatic dysfunction of sacral region: Secondary | ICD-10-CM | POA: Diagnosis not present

## 2015-06-20 DIAGNOSIS — M5442 Lumbago with sciatica, left side: Secondary | ICD-10-CM | POA: Diagnosis not present

## 2015-06-23 ENCOUNTER — Other Ambulatory Visit: Payer: Self-pay | Admitting: Internal Medicine

## 2015-06-27 DIAGNOSIS — N323 Diverticulum of bladder: Secondary | ICD-10-CM | POA: Diagnosis not present

## 2015-06-27 DIAGNOSIS — N138 Other obstructive and reflux uropathy: Secondary | ICD-10-CM | POA: Diagnosis not present

## 2015-06-27 DIAGNOSIS — N200 Calculus of kidney: Secondary | ICD-10-CM | POA: Diagnosis not present

## 2015-06-27 DIAGNOSIS — R3912 Poor urinary stream: Secondary | ICD-10-CM | POA: Diagnosis not present

## 2015-06-27 DIAGNOSIS — N35013 Post-traumatic anterior urethral stricture: Secondary | ICD-10-CM | POA: Diagnosis not present

## 2015-06-27 DIAGNOSIS — N401 Enlarged prostate with lower urinary tract symptoms: Secondary | ICD-10-CM | POA: Diagnosis not present

## 2015-06-27 DIAGNOSIS — R3911 Hesitancy of micturition: Secondary | ICD-10-CM | POA: Diagnosis not present

## 2015-07-02 DIAGNOSIS — M9904 Segmental and somatic dysfunction of sacral region: Secondary | ICD-10-CM | POA: Diagnosis not present

## 2015-07-02 DIAGNOSIS — M5137 Other intervertebral disc degeneration, lumbosacral region: Secondary | ICD-10-CM | POA: Diagnosis not present

## 2015-07-02 DIAGNOSIS — M5442 Lumbago with sciatica, left side: Secondary | ICD-10-CM | POA: Diagnosis not present

## 2015-07-02 DIAGNOSIS — M9902 Segmental and somatic dysfunction of thoracic region: Secondary | ICD-10-CM | POA: Diagnosis not present

## 2015-07-02 DIAGNOSIS — M546 Pain in thoracic spine: Secondary | ICD-10-CM | POA: Diagnosis not present

## 2015-07-02 DIAGNOSIS — M9903 Segmental and somatic dysfunction of lumbar region: Secondary | ICD-10-CM | POA: Diagnosis not present

## 2015-07-02 DIAGNOSIS — M5136 Other intervertebral disc degeneration, lumbar region: Secondary | ICD-10-CM | POA: Diagnosis not present

## 2015-07-03 ENCOUNTER — Encounter: Payer: Self-pay | Admitting: Internal Medicine

## 2015-07-03 ENCOUNTER — Other Ambulatory Visit (INDEPENDENT_AMBULATORY_CARE_PROVIDER_SITE_OTHER): Payer: PPO

## 2015-07-03 ENCOUNTER — Ambulatory Visit (INDEPENDENT_AMBULATORY_CARE_PROVIDER_SITE_OTHER): Payer: PPO | Admitting: Internal Medicine

## 2015-07-03 VITALS — BP 118/84 | HR 57 | Temp 98.3°F | Resp 16 | Ht 68.5 in | Wt 165.0 lb

## 2015-07-03 DIAGNOSIS — R300 Dysuria: Secondary | ICD-10-CM | POA: Diagnosis not present

## 2015-07-03 DIAGNOSIS — M9904 Segmental and somatic dysfunction of sacral region: Secondary | ICD-10-CM | POA: Diagnosis not present

## 2015-07-03 DIAGNOSIS — M9902 Segmental and somatic dysfunction of thoracic region: Secondary | ICD-10-CM | POA: Diagnosis not present

## 2015-07-03 DIAGNOSIS — I10 Essential (primary) hypertension: Secondary | ICD-10-CM | POA: Diagnosis not present

## 2015-07-03 DIAGNOSIS — N3289 Other specified disorders of bladder: Secondary | ICD-10-CM

## 2015-07-03 DIAGNOSIS — M5442 Lumbago with sciatica, left side: Secondary | ICD-10-CM | POA: Diagnosis not present

## 2015-07-03 DIAGNOSIS — R102 Pelvic and perineal pain: Secondary | ICD-10-CM | POA: Diagnosis not present

## 2015-07-03 DIAGNOSIS — M5136 Other intervertebral disc degeneration, lumbar region: Secondary | ICD-10-CM | POA: Diagnosis not present

## 2015-07-03 DIAGNOSIS — M9903 Segmental and somatic dysfunction of lumbar region: Secondary | ICD-10-CM | POA: Diagnosis not present

## 2015-07-03 DIAGNOSIS — F411 Generalized anxiety disorder: Secondary | ICD-10-CM | POA: Diagnosis not present

## 2015-07-03 DIAGNOSIS — M546 Pain in thoracic spine: Secondary | ICD-10-CM | POA: Diagnosis not present

## 2015-07-03 DIAGNOSIS — M5137 Other intervertebral disc degeneration, lumbosacral region: Secondary | ICD-10-CM | POA: Diagnosis not present

## 2015-07-03 LAB — BASIC METABOLIC PANEL
BUN: 21 mg/dL (ref 6–23)
CALCIUM: 9.1 mg/dL (ref 8.4–10.5)
CHLORIDE: 105 meq/L (ref 96–112)
CO2: 29 meq/L (ref 19–32)
CREATININE: 0.78 mg/dL (ref 0.40–1.50)
GFR: 102.6 mL/min (ref >60.00)
GLUCOSE: 111 mg/dL — AB (ref 70–99)
Potassium: 3.9 mEq/L (ref 3.5–5.1)
SODIUM: 139 meq/L (ref 135–145)

## 2015-07-03 LAB — POCT URINALYSIS DIPSTICK
BILIRUBIN UA: NEGATIVE
GLUCOSE UA: NEGATIVE
Ketones, UA: NEGATIVE
Leukocytes, UA: NEGATIVE
NITRITE UA: NEGATIVE
Protein, UA: NEGATIVE
RBC UA: NEGATIVE
Spec Grav, UA: 1.02
UROBILINOGEN UA: 0.2
pH, UA: 6.5

## 2015-07-03 MED ORDER — ALPRAZOLAM 0.5 MG PO TABS: ORAL_TABLET | ORAL | Status: DC

## 2015-07-03 MED ORDER — PREGABALIN 50 MG PO CAPS: 50.0000 mg | ORAL_CAPSULE | Freq: Two times a day (BID) | ORAL | Status: DC

## 2015-07-03 NOTE — Patient Instructions (Signed)
Pelvic Pain, Male Pelvic pain is pain below the belly button and located between your hips in the groin. Acute pain may last a few hours or days. Chronic pelvic pain may last weeks and months. The cause may be different for different types of pain. The pain may be dull or sharp, mild or severe and can interfere with your daily activities. CAUSES  Some common causes of male pelvic pain include:  A sexually transmitted disease (STD).  Inflammation of the prostate (prostatitis).  A bladder infection.  Intestinal problems, such as constipation, irritable bowel syndrome, colitis, or ileitis.  Kidney stones.  Stress or depression.  Muscle spasms or pain in the pelvic floor muscles.  Musculoskeletal problems of the back.  A hernia.  Inflammation of the epididymis, which sits on top of the testicle (epididymitis).  Cancer. EVALUATION  Your caregiver will want to take a careful history of your concerns. This includes recent changes in your health and a sexual history. Obtaining your family history and medical history is also important. In order to identify the cause of the pelvic pain and to be properly treated, your caregiver will perform a physical exam and may order tests. These tests may include:   Ultrasound of the prostate or scrotum.  CT scan or MRI of the pelvis or lower spine.  A urinalysis or evaluation of discharge from the penis.  Blood tests. HOME CARE INSTRUCTIONS   Only take over-the-counter or prescription medicines as directed by your caregiver.  Rest as directed by your caregiver.  Eat a balanced diet.  Drink enough fluids to make your urine clear or pale yellow, or as directed.  Avoid sexual intercourse if it causes pain.  Apply warm or cold compresses to the lower abdomen depending on which one helps the pain.  Avoid stressful situations.  Keep a journal of your pelvic pain. Write down when it started, where the pain is located, and if there are things  that seem to be associated with the pain, such as food or certain activities.  Follow up with your caregiver as directed. SEEK MEDICAL CARE IF: Your medicine does not help your pain. SEEK IMMEDIATE MEDICAL CARE IF:  Your pelvic pain increases.  You feel lightheaded or faint.  You have chills.  You have a fever or persistent symptoms for more than 3 days.  You have a fever and your symptoms suddenly get worse.  You have pain with urination or blood in your urine.  You have uncontrolled diarrhea or vomiting. MAKE SURE YOU:   Understand these instructions.  Will watch your condition.  Will get help if you are not doing well or get worse.   This information is not intended to replace advice given to you by your health care provider. Make sure you discuss any questions you have with your health care provider.   Document Released: 10/13/2011 Document Revised: 02/08/2014 Document Reviewed: 10/13/2011 Elsevier Interactive Patient Education [employer].

## 2015-07-03 NOTE — Progress Notes (Signed)
Pre visit review using our clinic review tool, if applicable. No additional management support is needed unless otherwise documented below in the visit note. 

## 2015-07-03 NOTE — Progress Notes (Signed)
Subjective:  Patient ID: Mark Jordan, male    DOB: 26-Jun-1940  Age: 75 y.o. MRN: [id number]  CC: Pelvic Pain and Hypertension   HPI Mark Jordan presents for complaints of persistent discomfort around his groin and pelvic area with dysuria. He tells me he saw his urologist, Dr. Rosana Hoes, a few weeks ago and was told that everything was fine. He is concerned that he still has discomfort. He has tried gabapentin for symptom relief and says it is not helping very much.  Outpatient Prescriptions Prior to Visit  Medication Sig Dispense Refill  . amLODipine-benazepril (LOTREL) 10-20 MG capsule TAKE 1 CAPSULE BY MOUTH ONCE DAILY 30 capsule 5  . omega-3 acid ethyl esters (LOVAZA) 1 G capsule Take 2 capsules (2 g total) by mouth 2 (two) times daily. 120 capsule 11  . omeprazole (PRILOSEC) 40 MG capsule Take 1 capsule (40 mg total) by mouth daily. 90 capsule 3  . Tamsulosin HCl (FLOMAX) 0.4 MG CAPS Take 0.4 mg by mouth daily after supper.     . vitamin B-12 (CYANOCOBALAMIN) 1000 MCG tablet Take 1,000 mcg by mouth daily. Reported on 03/12/2015    . ALPRAZolam (XANAX) 0.5 MG tablet 1/2-1 qhs prn only 30 tablet 0  . gabapentin (NEURONTIN) 300 MG capsule TAKE ONE CAPSULE BY MOUTH AT BEDTIME (Patient taking differently: TAKE ONE CAPSULE BY MOUTH AT BEDTIME prn) 30 capsule 11   No facility-administered medications prior to visit.    ROS Review of Systems  Constitutional: Negative.  Negative for fever, chills, diaphoresis, appetite change and fatigue.  HENT: Negative.   Eyes: Negative.   Respiratory: Negative.  Negative for cough, choking, chest tightness, shortness of breath and stridor.   Cardiovascular: Negative.  Negative for chest pain, palpitations and leg swelling.  Gastrointestinal: Negative.  Negative for nausea, vomiting, abdominal pain, diarrhea and constipation.  Endocrine: Negative.   Genitourinary: Positive for dysuria. Negative for urgency, frequency, hematuria, flank pain, decreased  urine volume, discharge, penile swelling, scrotal swelling, enuresis, difficulty urinating, genital sores, penile pain and testicular pain.  Musculoskeletal: Negative.   Skin: Negative.  Negative for color change and rash.  Allergic/Immunologic: Negative.   Neurological: Negative.   Hematological: Negative.  Negative for adenopathy. Does not bruise/bleed easily.  Psychiatric/Behavioral: Negative for suicidal ideas, sleep disturbance, dysphoric mood, decreased concentration and agitation. The patient is nervous/anxious.     Objective:  BP 118/84 mmHg  Pulse 57  Temp(Src) 98.3 F (36.8 C) (Oral)  Resp 16  Ht 5' 8.5" (1.74 m)  Wt 165 lb (74.844 kg)  BMI 24.72 kg/m2  SpO2 95%  BP Readings from Last 3 Encounters:  07/03/15 118/84  03/12/15 110/78  11/01/14 122/64    Wt Readings from Last 3 Encounters:  07/03/15 165 lb (74.844 kg)  03/12/15 158 lb (71.668 kg)  11/01/14 152 lb (68.947 kg)    Physical Exam  Constitutional: He is oriented to person, place, and time.  Non-toxic appearance. He does not have a sickly appearance. He does not appear ill. No distress.  HENT:  Mouth/Throat: Oropharynx is clear and moist. No oropharyngeal exudate.  Eyes: Conjunctivae are normal. Right eye exhibits no discharge. Left eye exhibits no discharge. No scleral icterus.  Neck: Normal range of motion. Neck supple. No JVD present. No tracheal deviation present. No thyromegaly present.  Cardiovascular: Normal rate, regular rhythm, normal heart sounds and intact distal pulses.  Exam reveals no gallop and no friction rub.   No murmur heard. Pulmonary/Chest: Effort normal and breath  sounds normal. No stridor. No respiratory distress. He has no wheezes. He has no rales. He exhibits no tenderness.  Abdominal: Soft. Bowel sounds are normal. He exhibits no distension and no mass. There is no tenderness. There is no rebound and no guarding.  Genitourinary:  GU and rectal exams were deferred today at his  request since he tells me he just saw his urologist 2 weeks ago and that these exams were performed by him and he was told that everything is okay.  Musculoskeletal: Normal range of motion. He exhibits no edema or tenderness.  Lymphadenopathy:    He has no cervical adenopathy.  Neurological: He is oriented to person, place, and time.  Skin: Skin is warm and dry. No rash noted. No erythema. No pallor.  Psychiatric: He has a normal mood and affect. His behavior is normal. Judgment and thought content normal.  Vitals reviewed.   Lab Results  Component Value Date   WBC 6.7 03/12/2015   HGB 14.9 03/12/2015   HCT 44.9 03/12/2015   PLT 207.0 03/12/2015   GLUCOSE 111* 07/03/2015   CHOL 164 11/01/2014   TRIG 36.0 11/01/2014   HDL 42.90 11/01/2014   LDLDIRECT 144.2 02/07/2013   LDLCALC 114* 11/01/2014   ALT 13 11/01/2014   AST 15 11/01/2014   NA 139 07/03/2015   K 3.9 07/03/2015   CL 105 07/03/2015   CREATININE 0.78 07/03/2015   BUN 21 07/03/2015   CO2 29 07/03/2015   TSH 1.68 11/01/2014   PSA 0.80 02/07/2013   INR 0.97 09/10/2009   HGBA1C 5.9 11/01/2014    Ct Abdomen Pelvis W Contrast  01/30/2014  CLINICAL DATA:  Unexplained weight loss. EXAM: CT ABDOMEN AND PELVIS WITH CONTRAST TECHNIQUE: Multidetector CT imaging of the abdomen and pelvis was performed using the standard protocol following bolus administration of intravenous contrast. CONTRAST:  197mL OMNIPAQUE IOHEXOL 300 MG/ML  SOLN COMPARISON:  Included portions from chest CT 01/06/2014 and 03/01/2013 FINDINGS: A peripheral wedge-shaped density in the lingula is new from prior exam and likely atelectasis. Small to moderate hiatal hernia. The liver, gallbladder, spleen, and adrenal glands appear normal. There is a 2 mm nonobstructing stone in the mid right kidney. A 17 mm hypodensity in the upper right kidney, slightly higher than simple fluid density but unchanged in size compared to prior. No focal abnormality of the left kidney.  Symmetric renal enhancement and excretion. There are no dilated or thickened small bowel loops. There is a moderate volume of stool throughout the entire colon. Multiple colonic diverticula are seen without diverticulitis. No colonic wall thickening. The appendix is normal. The abdominal aorta is normal in caliber. There is no retroperitoneal adenopathy. No free air, free fluid, or intra-abdominal fluid collection. Within the pelvis the urinary bladder is distended. There are multiple bladder diverticula including right lateral diverticulum measuring 2.5 cm, 1.8 cm left lateral diverticula at the dome. There is questionable associated wall thickening in the dome of the bladder. Prostate gland appears enlarged measuring 6.1 x 3.9 x 3.9 cm. Postsurgical change left inguinal region. There is no pelvic free fluid or adenopathy. Multilevel degenerative change in the spine most significant at the lumbosacral junction with facet arthropathy. There is degenerative change of both hips. Probable underlying osteopenia. No suspicious osseous lesion. IMPRESSION: 1. Mildly distended urinary bladder with multiple bladder diverticula. There is equivocal bladder wall thickening in the dome. Findings can be seen in a setting of chronic bladder outlet obstruction, however direct visualization may be helpful to exclude  underlying lesion given history of weight loss. 2. Nonobstructing right renal stone.  Right renal cyst. 3. Colonic diverticulosis without diverticulitis. Moderate stool throughout the colon can be seen in the setting of constipation. 4. Lingular opacity is new from chest CT 2 weeks prior and likely atelectasis. Moderate hiatal hernia. Electronically Signed   By: Jeb Levering M.D.   On: 01/30/2014 13:42    Assessment & Plan:   Mathhew was seen today for pelvic pain and hypertension.  Diagnoses and all orders for this visit:  Bladder wall thickening- I've asked him to undergo another CT scan of the pelvis with  contrast to see if there is been a complication from the bladder wall thickening such as an abscess, malignant development, or bleeding complication. -     CT Abdomen Pelvis Wo Contrast; Future  Pelvic pain in male- will discontinue gabapentin since it is not controlling the pain and ask him to start Lyrica. Will start a low dose and increase the Lyrica dose as indicated. I think his signs and symptoms are consistent with chronic pelvic pain/chronic prostatitis -     pregabalin (LYRICA) 50 MG capsule; Take 1 capsule (50 mg total) by mouth 2 (two) times daily. For pelvic pain. -     CT Abdomen Pelvis Wo Contrast; Future  GAD (generalized anxiety disorder) -     ALPRAZolam (XANAX) 0.5 MG tablet; 1/2-1 qhs prn only  Essential hypertension - his blood pressure is well controlled -     Basic metabolic panel; Future  Dysuria- His urinalysis is normal today, I am concerned that this is part of his chronic pelvic pain/chronic prostatitis syndrome and will treat as above -     POCT urinalysis dipstick   I have discontinued Mr. Oxendine's gabapentin. I am also having him start on pregabalin. Additionally, I am having him maintain his tamsulosin, vitamin B-12, omega-3 acid ethyl esters, omeprazole, amLODipine-benazepril, and ALPRAZolam.  Meds ordered this encounter  Medications  . ALPRAZolam (XANAX) 0.5 MG tablet    Sig: 1/2-1 qhs prn only    Dispense:  30 tablet    Refill:  3  . pregabalin (LYRICA) 50 MG capsule    Sig: Take 1 capsule (50 mg total) by mouth 2 (two) times daily. For pelvic pain.    Dispense:  180 capsule    Refill:  1     Follow-up: Return in about 2 months (around 09/02/2015).  Scarlette Calico, MD

## 2015-07-04 DIAGNOSIS — M5136 Other intervertebral disc degeneration, lumbar region: Secondary | ICD-10-CM | POA: Diagnosis not present

## 2015-07-04 DIAGNOSIS — M9903 Segmental and somatic dysfunction of lumbar region: Secondary | ICD-10-CM | POA: Diagnosis not present

## 2015-07-04 DIAGNOSIS — M9902 Segmental and somatic dysfunction of thoracic region: Secondary | ICD-10-CM | POA: Diagnosis not present

## 2015-07-04 DIAGNOSIS — M9904 Segmental and somatic dysfunction of sacral region: Secondary | ICD-10-CM | POA: Diagnosis not present

## 2015-07-04 DIAGNOSIS — M5442 Lumbago with sciatica, left side: Secondary | ICD-10-CM | POA: Diagnosis not present

## 2015-07-04 DIAGNOSIS — M5137 Other intervertebral disc degeneration, lumbosacral region: Secondary | ICD-10-CM | POA: Diagnosis not present

## 2015-07-04 DIAGNOSIS — M546 Pain in thoracic spine: Secondary | ICD-10-CM | POA: Diagnosis not present

## 2015-07-09 DIAGNOSIS — M5136 Other intervertebral disc degeneration, lumbar region: Secondary | ICD-10-CM | POA: Diagnosis not present

## 2015-07-09 DIAGNOSIS — M9903 Segmental and somatic dysfunction of lumbar region: Secondary | ICD-10-CM | POA: Diagnosis not present

## 2015-07-09 DIAGNOSIS — M546 Pain in thoracic spine: Secondary | ICD-10-CM | POA: Diagnosis not present

## 2015-07-09 DIAGNOSIS — M9904 Segmental and somatic dysfunction of sacral region: Secondary | ICD-10-CM | POA: Diagnosis not present

## 2015-07-09 DIAGNOSIS — M5137 Other intervertebral disc degeneration, lumbosacral region: Secondary | ICD-10-CM | POA: Diagnosis not present

## 2015-07-09 DIAGNOSIS — M9902 Segmental and somatic dysfunction of thoracic region: Secondary | ICD-10-CM | POA: Diagnosis not present

## 2015-07-09 DIAGNOSIS — M5442 Lumbago with sciatica, left side: Secondary | ICD-10-CM | POA: Diagnosis not present

## 2015-07-10 ENCOUNTER — Ambulatory Visit
Admission: RE | Admit: 2015-07-10 | Discharge: 2015-07-10 | Disposition: A | Discharge status: Home / Self Care | Payer: PPO | Source: Ambulatory Visit | Attending: Internal Medicine | Admitting: Internal Medicine

## 2015-07-10 DIAGNOSIS — N3289 Other specified disorders of bladder: Secondary | ICD-10-CM

## 2015-07-10 DIAGNOSIS — R102 Pelvic and perineal pain: Secondary | ICD-10-CM

## 2015-07-10 MED ORDER — IOHEXOL 300 MG/ML  SOLN
30.0000 mL | Freq: Once | INTRAMUSCULAR | Status: AC | PRN
  Administered 2015-07-10: 30 mL via ORAL

## 2015-07-12 ENCOUNTER — Encounter: Payer: Self-pay | Admitting: Internal Medicine

## 2015-07-21 DIAGNOSIS — N281 Cyst of kidney, acquired: Secondary | ICD-10-CM | POA: Diagnosis not present

## 2015-07-21 DIAGNOSIS — N323 Diverticulum of bladder: Secondary | ICD-10-CM | POA: Diagnosis not present

## 2015-07-21 DIAGNOSIS — N210 Calculus in bladder: Secondary | ICD-10-CM | POA: Diagnosis not present

## 2015-07-21 DIAGNOSIS — R3912 Poor urinary stream: Secondary | ICD-10-CM | POA: Diagnosis not present

## 2015-07-21 DIAGNOSIS — N35013 Post-traumatic anterior urethral stricture: Secondary | ICD-10-CM | POA: Diagnosis not present

## 2015-07-21 DIAGNOSIS — N401 Enlarged prostate with lower urinary tract symptoms: Secondary | ICD-10-CM | POA: Diagnosis not present

## 2015-07-21 DIAGNOSIS — N200 Calculus of kidney: Secondary | ICD-10-CM | POA: Diagnosis not present

## 2015-07-21 DIAGNOSIS — R3911 Hesitancy of micturition: Secondary | ICD-10-CM | POA: Diagnosis not present

## 2015-08-04 DIAGNOSIS — N323 Diverticulum of bladder: Secondary | ICD-10-CM | POA: Diagnosis not present

## 2015-08-04 DIAGNOSIS — N400 Enlarged prostate without lower urinary tract symptoms: Secondary | ICD-10-CM | POA: Diagnosis not present

## 2015-08-04 DIAGNOSIS — N210 Calculus in bladder: Secondary | ICD-10-CM | POA: Diagnosis not present

## 2015-08-04 DIAGNOSIS — N281 Cyst of kidney, acquired: Secondary | ICD-10-CM | POA: Diagnosis not present

## 2015-08-04 DIAGNOSIS — N35013 Post-traumatic anterior urethral stricture: Secondary | ICD-10-CM | POA: Diagnosis not present

## 2015-08-04 DIAGNOSIS — N200 Calculus of kidney: Secondary | ICD-10-CM | POA: Diagnosis not present

## 2015-08-12 DIAGNOSIS — M2042 Other hammer toe(s) (acquired), left foot: Secondary | ICD-10-CM | POA: Insufficient documentation

## 2015-10-08 ENCOUNTER — Ambulatory Visit (INDEPENDENT_AMBULATORY_CARE_PROVIDER_SITE_OTHER): Payer: PPO | Admitting: Internal Medicine

## 2015-10-08 ENCOUNTER — Encounter: Payer: Self-pay | Admitting: Internal Medicine

## 2015-10-08 VITALS — BP 100/72 | HR 58 | Temp 97.5°F | Resp 16 | Wt 168.0 lb

## 2015-10-08 DIAGNOSIS — M5412 Radiculopathy, cervical region: Secondary | ICD-10-CM | POA: Diagnosis not present

## 2015-10-08 DIAGNOSIS — M5416 Radiculopathy, lumbar region: Secondary | ICD-10-CM | POA: Insufficient documentation

## 2015-10-08 MED ORDER — GABAPENTIN 300 MG PO CAPS: 300.0000 mg | ORAL_CAPSULE | Freq: Every day | ORAL | 3 refills | Status: DC

## 2015-10-08 MED ORDER — NAPROXEN 500 MG PO TABS: 500.0000 mg | ORAL_TABLET | Freq: Two times a day (BID) | ORAL | 0 refills | Status: DC

## 2015-10-08 NOTE — Progress Notes (Signed)
Subjective:    Patient ID: Mark Jordan, male    DOB: 1940/11/05, 75 y.o.   MRN: [id number]  HPI He is here for an acute visit.   He started having lower back pain one week ago.  He denies a known cause, such as a fall, injury, lifting something. The pain radiates to the right leg - to his hip.  He denies numbness/tingling and weakness in either leg.  He has had pain in the back in the past, but this is little different.  It is worse when he first gets up after sitting.  It is better after moving around for a bit.  He has not been sleeping well.   He has not taken anything for it.  He takes gabapentin on occasion for his nerve pain related to his left shoulder.  He has not tried taking that.   For the past month or so he has had some tingling in his left neck and upper chest.   It only more when his head is tilted back.  It is intermittent.  He was unsure if it was related to his shoulder or not.  He denies chest pain or sob.  he denies neck pain.    Medications and allergies reviewed with patient and updated if appropriate.  Patient Active Problem List   Diagnosis Date Noted  . Pelvic pain in male 07/03/2015  . GAD (generalized anxiety disorder) 07/03/2015  . Hernia of abdominal cavity 03/12/2015  . Hyperglycemia 11/01/2014  . Bladder wall thickening 02/01/2014  . Insomnia, persistent 10/29/2013  . 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 BPH (benign prostatic hypertrophy) with urinary obstruction 02/21/2012  . DJD (degenerative joint disease) 02/21/2012  . Hearing loss of both ears 07/24/2010  . HYPERCHOLESTEROLEMIA 11/22/2006  . Essential hypertension 11/22/2006  . ALLERGIC RHINITIS 11/22/2006  . GERD 11/22/2006    Current Outpatient Prescriptions on File Prior to Visit  Medication Sig Dispense Refill  . ALPRAZolam (XANAX) 0.5 MG tablet 1/2-1 qhs prn only 30 tablet 3  . amLODipine-benazepril (LOTREL) 10-20 MG capsule TAKE 1 CAPSULE  BY MOUTH ONCE DAILY 30 capsule 5  . omeprazole (PRILOSEC) 40 MG capsule Take 1 capsule (40 mg total) by mouth daily. 90 capsule 3  . Tamsulosin HCl (FLOMAX) 0.4 MG CAPS Take 0.4 mg by mouth daily after supper.     . vitamin B-12 (CYANOCOBALAMIN) 1000 MCG tablet Take 1,000 mcg by mouth daily. Reported on 03/12/2015     No current facility-administered medications on file prior to visit.     Past Medical History:  Diagnosis Date  . Allergic rhinitis   . Anxiety   . Chronic low back pain   . Diverticulosis of colon   . GERD (gastroesophageal reflux disease)   . HTN (hypertension)   . Hypercholesterolemia   . Kidney calculus   . PUD (peptic ulcer disease)     Past Surgical History:  Procedure Laterality Date  . COLONOSCOPY  2014  . INGUINAL HERNIA REPAIR     left  . LEG SURGERY    . ROTATOR CUFF REPAIR  5/08   left ; Dr Onnie Graham  . TOTAL SHOULDER ARTHROPLASTY  8/11   after fall w/ prox humerus fx    Social History   Social History  . Marital status: Married    Spouse name: Kelcey Arena  . Number of children: 3  . Years of education: N/A   Occupational History  .  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 Smoking status: Never Smoker  . Smokeless tobacco: Never Used  . Alcohol use No  . Drug use: No  . Sexual activity: Yes    Partners: Female   Other Topics Concern  . Not on file   Social History Narrative   Does not exercise   1 cup coffee a day   4 siblings all in good health          Family History  Problem Relation Age of Onset  . Heart failure Father   . Pneumonia Mother   . Diabetes Neg Hx   . Stroke Neg Hx   . Heart attack Neg Hx     Review of Systems  Constitutional: Negative for fever.  Gastrointestinal:       No fecal incontinence  Genitourinary:       No urinary incontinence  Musculoskeletal: Positive for back pain.  Neurological: Negative for weakness and numbness.       Objective:   Vitals:   10/08/15 1351  BP:  100/72  Pulse: (!) 58  Resp: 16  Temp: 97.5 F (36.4 C)   Filed Weights   10/08/15 1351  Weight: 168 lb (76.2 kg)   Body mass index is 25.17 kg/m.   Physical Exam  Constitutional: He appears well-developed and well-nourished. No distress.  Musculoskeletal: Normal range of motion. He exhibits no edema.  Mild pain with palpation in right SI joint region, no lumbar spine deformity or tenderness with palpation, slight increased pain with right leg raise  Neurological:  Normal sensation and strength in b/l LE extremities, gait normal. Normal sensation and strength b/l UE.   Skin: He is not diaphoretic.          Assessment & Plan:   See Problem List for Assessment and Plan of chronic medical problems.

## 2015-10-08 NOTE — Patient Instructions (Addendum)
Start taking the gabapentin 300 mg at night.   Start taking naproxen (aleve) twice daily with food.  Stop taking if your stomach gets upset.  Ice the area.  See your chiropractor.    Your prescription(s) have been submitted to your pharmacy. Please take as directed and contact our office if you believe you are having problem(s) with the medication(s).   Please followup if your symptoms do not improve.

## 2015-10-08 NOTE — Assessment & Plan Note (Signed)
Likely the tingling in his neck / upper chest is nerve related, less likely related to his shoulder May need imaging of c spine Will start gabapentin Sees ortho and will see his chiropractor

## 2015-10-08 NOTE — Assessment & Plan Note (Signed)
Xray done a couple of years ago shows degenerative changes - no concerning symptoms or findings on exam so I will not repeat an xray Start gabapentin 300 mg at night Start naproxen 500 mg twice daily with food - stop if any stomach upset or gerd - explained this should be taken short term only Deferred PT He sees a chiropractor and will see him this week Call or return if no improvement

## 2015-10-09 DIAGNOSIS — M5137 Other intervertebral disc degeneration, lumbosacral region: Secondary | ICD-10-CM | POA: Diagnosis not present

## 2015-10-09 DIAGNOSIS — M5442 Lumbago with sciatica, left side: Secondary | ICD-10-CM | POA: Diagnosis not present

## 2015-10-09 DIAGNOSIS — M5136 Other intervertebral disc degeneration, lumbar region: Secondary | ICD-10-CM | POA: Diagnosis not present

## 2015-10-09 DIAGNOSIS — M9903 Segmental and somatic dysfunction of lumbar region: Secondary | ICD-10-CM | POA: Diagnosis not present

## 2015-10-09 DIAGNOSIS — M9902 Segmental and somatic dysfunction of thoracic region: Secondary | ICD-10-CM | POA: Diagnosis not present

## 2015-10-09 DIAGNOSIS — M546 Pain in thoracic spine: Secondary | ICD-10-CM | POA: Diagnosis not present

## 2015-10-09 DIAGNOSIS — M9904 Segmental and somatic dysfunction of sacral region: Secondary | ICD-10-CM | POA: Diagnosis not present

## 2015-10-31 DIAGNOSIS — H25813 Combined forms of age-related cataract, bilateral: Secondary | ICD-10-CM | POA: Diagnosis not present

## 2015-10-31 DIAGNOSIS — H35443 Age-related reticular degeneration of retina, bilateral: Secondary | ICD-10-CM | POA: Diagnosis not present

## 2015-10-31 DIAGNOSIS — H52222 Regular astigmatism, left eye: Secondary | ICD-10-CM | POA: Diagnosis not present

## 2015-10-31 DIAGNOSIS — I10 Essential (primary) hypertension: Secondary | ICD-10-CM | POA: Diagnosis not present

## 2015-10-31 DIAGNOSIS — H524 Presbyopia: Secondary | ICD-10-CM | POA: Diagnosis not present

## 2015-10-31 DIAGNOSIS — H43393 Other vitreous opacities, bilateral: Secondary | ICD-10-CM | POA: Diagnosis not present

## 2015-10-31 DIAGNOSIS — H5203 Hypermetropia, bilateral: Secondary | ICD-10-CM | POA: Diagnosis not present

## 2015-10-31 DIAGNOSIS — H43813 Vitreous degeneration, bilateral: Secondary | ICD-10-CM | POA: Diagnosis not present

## 2015-12-18 DIAGNOSIS — L820 Inflamed seborrheic keratosis: Secondary | ICD-10-CM | POA: Diagnosis not present

## 2015-12-18 DIAGNOSIS — L821 Other seborrheic keratosis: Secondary | ICD-10-CM | POA: Diagnosis not present

## 2015-12-18 DIAGNOSIS — D485 Neoplasm of uncertain behavior of skin: Secondary | ICD-10-CM | POA: Diagnosis not present

## 2015-12-18 DIAGNOSIS — Z85828 Personal history of other malignant neoplasm of skin: Secondary | ICD-10-CM | POA: Diagnosis not present

## 2015-12-26 DIAGNOSIS — L259 Unspecified contact dermatitis, unspecified cause: Secondary | ICD-10-CM | POA: Diagnosis not present

## 2015-12-27 DIAGNOSIS — R21 Rash and other nonspecific skin eruption: Secondary | ICD-10-CM | POA: Diagnosis not present

## 2016-01-29 DIAGNOSIS — Z85828 Personal history of other malignant neoplasm of skin: Secondary | ICD-10-CM | POA: Diagnosis not present

## 2016-01-29 DIAGNOSIS — L723 Sebaceous cyst: Secondary | ICD-10-CM | POA: Diagnosis not present

## 2016-02-06 ENCOUNTER — Other Ambulatory Visit: Payer: Self-pay | Admitting: Internal Medicine

## 2016-02-06 DIAGNOSIS — F411 Generalized anxiety disorder: Secondary | ICD-10-CM

## 2016-02-09 DIAGNOSIS — N323 Diverticulum of bladder: Secondary | ICD-10-CM | POA: Diagnosis not present

## 2016-02-09 DIAGNOSIS — N138 Other obstructive and reflux uropathy: Secondary | ICD-10-CM | POA: Diagnosis not present

## 2016-02-09 DIAGNOSIS — N35013 Post-traumatic anterior urethral stricture: Secondary | ICD-10-CM | POA: Diagnosis not present

## 2016-02-09 DIAGNOSIS — N281 Cyst of kidney, acquired: Secondary | ICD-10-CM | POA: Diagnosis not present

## 2016-02-09 DIAGNOSIS — N200 Calculus of kidney: Secondary | ICD-10-CM | POA: Diagnosis not present

## 2016-02-09 DIAGNOSIS — N401 Enlarged prostate with lower urinary tract symptoms: Secondary | ICD-10-CM | POA: Diagnosis not present

## 2016-02-21 ENCOUNTER — Telehealth: Payer: Self-pay | Admitting: Internal Medicine

## 2016-02-21 NOTE — Telephone Encounter (Signed)
Called patient to schedule awv. Left msg for patient to call office to schedule appt.  °

## 2016-02-24 DIAGNOSIS — M792 Neuralgia and neuritis, unspecified: Secondary | ICD-10-CM | POA: Diagnosis not present

## 2016-02-24 DIAGNOSIS — M2012 Hallux valgus (acquired), left foot: Secondary | ICD-10-CM | POA: Diagnosis not present

## 2016-02-24 DIAGNOSIS — M71572 Other bursitis, not elsewhere classified, left ankle and foot: Secondary | ICD-10-CM | POA: Diagnosis not present

## 2016-02-27 DIAGNOSIS — R194 Change in bowel habit: Secondary | ICD-10-CM | POA: Diagnosis not present

## 2016-02-27 DIAGNOSIS — Z86010 Personal history of colonic polyps: Secondary | ICD-10-CM | POA: Diagnosis not present

## 2016-03-01 NOTE — Progress Notes (Addendum)
Subjective:   Mark Jordan is a 76 y.o. male who presents for an Initial Medicare Annual Wellness Visit.  Review of Systems  No ROS.  Medicare Wellness Visit.  Cardiac Risk Factors include: advanced age (>61men, >41 women);male gender;dyslipidemia;hypertension   Sleep patterns: Sleep 2-3 hours then gets up.  Home Safety/Smoke Alarms: Fire alarms and security in place  Living environment; residence and Adult nurse: lives alone, single story, 3 small steps with rail. Firearms safety discussed Seat Belt Safety/Bike Helmet: Wears seat belt.   Counseling:   Eye Exam- last 2 months ago Dental- last 2 weeks ago, goes every 6 months  Male:   [facility]-colonoscopy 03/27/2012, polyp. Recall 5 years. Dr.  Earlean Shawl, next colonoscopy 03/09/16 PSA-02/07/2013, 0.80 followed by [facility] Lab Results  Component Value Date   PSA 0.80 02/07/2013   PSA 1.03 06/19/2012   PSA 0.95 07/21/2011       Objective:    Today's Vitals   03/02/16 0857  BP: 136/76  Pulse: 67  SpO2: 96%  Weight: 168 lb (76.2 kg)  Height: 5\' 9"  (1.753 m)   Body mass index is 24.81 kg/m.  Current Medications (verified) Outpatient Encounter Prescriptions as of 03/02/2016  Medication Sig  . ALPRAZolam (XANAX) 0.5 MG tablet TAKE 1/2 TO 1 TABLET BY MOUTH 3 TIMES DAILY AS NEEDED. FOR ANXIETY  . amLODipine-benazepril (LOTREL) 10-20 MG capsule TAKE 1 CAPSULE BY MOUTH ONCE DAILY  . gabapentin (NEURONTIN) 300 MG capsule Take 1 capsule (300 mg total) by mouth at bedtime. (Patient taking differently: Take 300 mg by mouth as needed. )  . Omega-3 Fatty Acids (FISH OIL) 1000 MG CPDR Take 1,000 mcg by mouth daily.  Marland Kitchen omeprazole (PRILOSEC) 40 MG capsule Take 1 capsule (40 mg total) by mouth daily.  . Tamsulosin HCl (FLOMAX) 0.4 MG CAPS Take 0.4 mg by mouth daily after supper.   . vitamin B-12 (CYANOCOBALAMIN) 1000 MCG tablet Take 1,000 mcg by mouth daily. Reported on 03/12/2015  . naproxen (NAPROSYN) 500 MG tablet Take 1  tablet (500 mg total) by mouth 2 (two) times daily with a meal. (Patient not taking: Reported on 03/02/2016)   No facility-administered encounter medications on file as of 03/02/2016.     Allergies (verified) Patient has no known allergies.   History: Past Medical History:  Diagnosis Date  . Allergic rhinitis   . Anxiety   . Chronic low back pain   . Diverticulosis of colon   . GERD (gastroesophageal reflux disease)   . HTN (hypertension)   . Hypercholesterolemia   . Kidney calculus   . PUD (peptic ulcer disease)    Past Surgical History:  Procedure Laterality Date  . COLONOSCOPY  2014  . INGUINAL HERNIA REPAIR     left  . LEG SURGERY    . ROTATOR CUFF REPAIR  5/08   left ; Dr Onnie Graham  . TOTAL SHOULDER ARTHROPLASTY  8/11   after fall w/ prox humerus fx   Family History  Problem Relation Age of Onset  . Heart failure Father   . Pneumonia Mother   . Diabetes Neg Hx   . Stroke Neg Hx   . 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 Smoking status: Never Smoker  . Smokeless tobacco: Never Used  . Alcohol use No  . Drug use: No  . Sexual activity: Yes    Partners: Female  Tobacco Counseling Counseling given: Not Answered   Activities of Daily Living In your present state of health, do you have any difficulty performing the following activities: 03/02/2016  Hearing? N  Vision? N  Difficulty concentrating or making decisions? N  Walking or climbing stairs? N  Dressing or bathing? N  Doing errands, shopping? N  Preparing Food and eating ? N  Using the Toilet? N  In the past six months, have you accidently leaked urine? N  Do you have problems with loss of bowel control? N  Managing your Medications? N  Managing your Finances? N  Housekeeping or managing your Housekeeping? N  Some recent data might be hidden    Immunizations and Health Maintenance Immunization History    Administered Date(s) Administered  . Influenza Split 10/18/2011  . Influenza Whole 12/05/2009  . Influenza, High Dose Seasonal PF 03/02/2016  . Influenza,inj,Quad PF,36+ Mos 11/01/2014  . Pneumococcal Conjugate-13 03/12/2015  . Pneumococcal Polysaccharide-23 04/03/2001, 03/31/2009  . Td 04/03/2001  . Tdap 07/21/2011   There are no preventive care reminders to display for this patient.  Patient Care Team: Janith Lima, MD as PCP - General (Internal Medicine) Richmond Campbell, MD as Consulting Physician (Gastroenterology) Kristopher Oppenheim (Dentistry) Myrlene Broker, MD (Urology) [place] (Podiatry)  Indicate any recent Medical Services you may have received from other than [facility] providers in the past year (date may be approximate).    Assessment:   This is a routine wellness examination for Mark Jordan. Physical assessment deferred to PCP.   Hearing/Vision screen Hearing Screening Comments: Able to hear conversational tones w/o difficulty. No issues reported.   Vision Screening Comments: Last exam Dr.states cataracts are beginning  Dietary issues and exercise activities discussed: Current Exercise Habits: The patient has a physically strenous job, but has no regular exercise apart from work. (maintains house and does yard work), Exercise limited by: None identified   Diet (meal preparation, eat out, water intake, caffeinated beverages, dairy products, fruits and vegetables):  Eats out frequently. Drinks 2 glasses of water per day. Coffee in the morning. Soda daily Breakfast: biscuit, 2 eggs with sausage and grits Lunch:  Skip or eat light meal. Kids meal at [employer], occasionally steak with vegetables.  Discussed healthier food options and increasing water.  Goals    . Drink more water and maintain my current health status (pt-stated)      Depression Screen PHQ 2/9 Scores 03/02/2016 01/22/2014 06/19/2012  PHQ - 2 Score 0 0 0    Fall  Risk Fall Risk  03/02/2016 01/22/2014 01/14/2014 06/01/2013 06/19/2012  Falls in the past year? No No No No No  Risk for fall due to : - - - Impaired vision;Medication side effect -    Cognitive Function:        Ad8 score reviewed for issues:  Issues making decisions: no    Less interest in hobbies / activities: no  Repeats questions, stories (family complaining): no  Trouble using ordinary gadgets (microwave, computer, phone):no  Forgets the month or year: no  Mismanaging finances: no  Remembering appts: no  Daily problems with thinking and/or memory: no Ad8 score is=     Screening Tests Health Maintenance  Topic Date Due  . INFLUENZA VACCINE  11/01/2016 (Originally 09/02/2015)  . ZOSTAVAX  02/20/2017 (Originally 07/23/1998)  . TETANUS/TDAP  07/20/2021  . PNA vac Low Risk Adult  Completed        Plan:  Continue to eat heart healthy diet (full of fruits, vegetables, whole grains, lean protein, water--limit salt, fat, and sugar intake) and increase physical activity as tolerated.  Continue doing brain stimulating activities (puzzles, reading, adult coloring books, staying active) to keep memory sharp.   Bring a copy of your advance directives to your next office visit.  Try Melatonin for sleep.  During the course of the visit Kimoni was educated and counseled about the following appropriate screening and preventive services:   Vaccines to include Pneumoccal, Influenza, Hepatitis B, Td, Zostavax, HCV  Colorectal cancer screening  Cardiovascular disease screening  Diabetes screening  Glaucoma screening  Nutrition counseling  Prostate cancer screening  Patient Instructions (the written plan) were given to the patient.   Michiel Cowboy, RN   03/02/2016    Medical screening examination/treatment/procedure(s) were performed by non-physician practitioner and as supervising physician I was immediately available for consultation/collaboration. I agree with  above. Scarlette Calico, MD

## 2016-03-01 NOTE — Progress Notes (Signed)
Pre visit review using our clinic review tool, if applicable. No additional management support is needed unless otherwise documented below in the visit note. 

## 2016-03-02 ENCOUNTER — Ambulatory Visit (INDEPENDENT_AMBULATORY_CARE_PROVIDER_SITE_OTHER): Payer: PPO | Admitting: *Deleted

## 2016-03-02 DIAGNOSIS — Z0000 Encounter for general adult medical examination without abnormal findings: Secondary | ICD-10-CM

## 2016-03-02 DIAGNOSIS — Z23 Encounter for immunization: Secondary | ICD-10-CM

## 2016-03-02 NOTE — Addendum Note (Signed)
Addended by: Emelia Loron A on: 03/02/2016 10:31 AM   Modules accepted: Miquel Dunn

## 2016-03-02 NOTE — Patient Instructions (Addendum)
Continue to eat heart healthy diet (full of fruits, vegetables, whole grains, lean protein, water--limit salt, fat, and sugar intake) and increase physical activity as tolerated.  Continue doing brain stimulating activities (puzzles, reading, adult coloring books, staying active) to keep memory sharp.   Bring a copy of your advance directives to your next office visit.  Try Melatonin for sleep.  Fall Prevention in the Home Introduction Falls can cause injuries. They can happen to people of all ages. There are many things you can do to make your home safe and to help prevent falls. What can I do on the outside of my home?  Regularly fix the edges of walkways and driveways and fix any cracks.  Remove anything that might make you trip as you walk through a door, such as a raised step or threshold.  Trim any bushes or trees on the path to your home.  Use bright outdoor lighting.  Clear any walking paths of anything that might make someone trip, such as rocks or tools.  Regularly check to see if handrails are loose or broken. Make sure that both sides of any steps have handrails.  Any raised decks and porches should have guardrails on the edges.  Have any leaves, snow, or ice cleared regularly.  Use sand or salt on walking paths during winter.  Clean up any spills in your garage right away. This includes oil or grease spills. What can I do in the bathroom?  Use night lights.  Install grab bars by the toilet and in the tub and shower. Do not use towel bars as grab bars.  Use non-skid mats or decals in the tub or shower.  If you need to sit down in the shower, use a plastic, non-slip stool.  Keep the floor dry. Clean up any water that spills on the floor as soon as it happens.  Remove soap buildup in the tub or shower regularly.  Attach bath mats securely with double-sided non-slip rug tape.  Do not have throw rugs and other things on the floor that can make you trip. What  can I do in the bedroom?  Use night lights.  Make sure that you have a light by your bed that is easy to reach.  Do not use any sheets or blankets that are too big for your bed. They should not hang down onto the floor.  Have a firm chair that has side arms. You can use this for support while you get dressed.  Do not have throw rugs and other things on the floor that can make you trip. What can I do in the kitchen?  Clean up any spills right away.  Avoid walking on wet floors.  Keep items that you use a lot in easy-to-reach places.  If you need to reach something above you, use a strong step stool that has a grab bar.  Keep electrical cords out of the way.  Do not use floor polish or wax that makes floors slippery. If you must use wax, use non-skid floor wax.  Do not have throw rugs and other things on the floor that can make you trip. What can I do with my stairs?  Do not leave any items on the stairs.  Make sure that there are handrails on both sides of the stairs and use them. Fix handrails that are broken or loose. Make sure that handrails are as long as the stairways.  Check any carpeting to make sure that it is  firmly attached to the stairs. Fix any carpet that is loose or worn.  Avoid having throw rugs at the top or bottom of the stairs. If you do have throw rugs, attach them to the floor with carpet tape.  Make sure that you have a light switch at the top of the stairs and the bottom of the stairs. If you do not have them, ask someone to add them for you. What else can I do to help prevent falls?  Wear shoes that:  Do not have high heels.  Have rubber bottoms.  Are comfortable and fit you well.  Are closed at the toe. Do not wear sandals.  If you use a stepladder:  Make sure that it is fully opened. Do not climb a closed stepladder.  Make sure that both sides of the stepladder are locked into place.  Ask someone to hold it for you, if  possible.  Clearly mark and make sure that you can see:  Any grab bars or handrails.  First and last steps.  Where the edge of each step is.  Use tools that help you move around (mobility aids) if they are needed. These include:  Canes.  Walkers.  Scooters.  Crutches.  Turn on the lights when you go into a dark area. Replace any light bulbs as soon as they burn out.  Set up your furniture so you have a clear path. Avoid moving your furniture around.  If any of your floors are uneven, fix them.  If there are any pets around you, be aware of where they are.  Review your medicines with your doctor. Some medicines can make you feel dizzy. This can increase your chance of falling. Ask your doctor what other things that you can do to help prevent falls. This information is not intended to replace advice given to you by your health care provider. Make sure you discuss any questions you have with your health care provider. Document Released: 11/14/2008 Document Revised: 06/26/2015 Document Reviewed: 02/22/2014  2017 [employer]  Health Maintenance, Male A healthy lifestyle and preventative care can promote health and wellness.  Maintain regular health, dental, and eye exams.  Eat a healthy diet. Foods like vegetables, fruits, whole grains, low-fat dairy products, and lean protein foods contain the nutrients you need and are low in calories. Decrease your intake of foods high in solid fats, added sugars, and salt. Get information about a proper diet from your health care provider, if necessary.  Regular physical exercise is one of the most important things you can do for your health. Most adults should get at least 150 minutes of moderate-intensity exercise (any activity that increases your heart rate and causes you to sweat) each week. In addition, most adults need muscle-strengthening exercises on 2 or more days a week.   Maintain a healthy weight. The body mass index (BMI) is a  screening tool to identify possible weight problems. It provides an estimate of body fat based on height and weight. Your health care provider can find your BMI and can help you achieve or maintain a healthy weight. For males 20 years and older:  A BMI below 18.5 is considered underweight.  A BMI of 18.5 to 24.9 is normal.  A BMI of 25 to 29.9 is considered overweight.  A BMI of 30 and above is considered obese.  Maintain normal blood lipids and cholesterol by exercising and minimizing your intake of saturated fat. Eat a balanced diet with plenty of fruits  and vegetables. Blood tests for lipids and cholesterol should begin at age 63 and be repeated every 5 years. If your lipid or cholesterol levels are high, you are over age 49, or you are at high risk for heart disease, you may need your cholesterol levels checked more frequently.Ongoing high lipid and cholesterol levels should be treated with medicines if diet and exercise are not working.  If you smoke, find out from your health care provider how to quit. If you do not use tobacco, do not start.  Lung cancer screening is recommended for adults aged 14-80 years who are at high risk for developing lung cancer because of a history of smoking. A yearly low-dose CT scan of the lungs is recommended for people who have at least a 30-pack-year history of smoking and are current smokers or have quit within the past 15 years. A pack year of smoking is smoking an average of 1 pack of cigarettes a day for 1 year (for example, a 30-pack-year history of smoking could mean smoking 1 pack a day for 30 years or 2 packs a day for 15 years). Yearly screening should continue until the smoker has stopped smoking for at least 15 years. Yearly screening should be stopped for people who develop a health problem that would prevent them from having lung cancer treatment.  If you choose to drink alcohol, do not have more than 2 drinks per day. One drink is considered to be  12 oz (360 mL) of beer, 5 oz (150 mL) of wine, or 1.5 oz (45 mL) of liquor.  Avoid the use of street drugs. Do not share needles with anyone. Ask for help if you need support or instructions about stopping the use of drugs.  High blood pressure causes heart disease and increases the risk of stroke. High blood pressure is more likely to develop in:  People who have blood pressure in the end of the normal range (100-139/85-89 mm Hg).  People who are overweight or obese.  People who are African American.  If you are 29-57 years of age, have your blood pressure checked every 3-5 years. If you are 46 years of age or older, have your blood pressure checked every year. You should have your blood pressure measured twice-once when you are at a hospital or clinic, and once when you are not at a hospital or clinic. Record the average of the two measurements. To check your blood pressure when you are not at a hospital or clinic, you can use:  An automated blood pressure machine at a pharmacy.  A home blood pressure monitor.  If you are 66-2 years old, ask your health care provider if you should take aspirin to prevent heart disease.  Diabetes screening involves taking a blood sample to check your fasting blood sugar level. This should be done once every 3 years after age 64 if you are at a normal weight and without risk factors for diabetes. Testing should be considered at a younger age or be carried out more frequently if you are overweight and have at least 1 risk factor for diabetes.  Colorectal cancer can be detected and often prevented. Most routine colorectal cancer screening begins at the age of 4 and continues through age 43. However, your health care provider may recommend screening at an earlier age if you have risk factors for colon cancer. On a yearly basis, your health care provider may provide home test kits to check for hidden blood in the  stool. A small camera at the end of a tube may be  used to directly examine the colon (sigmoidoscopy or colonoscopy) to detect the earliest forms of colorectal cancer. Talk to your health care provider about this at age 3 when routine screening begins. A direct exam of the colon should be repeated every 5-10 years through age 77, unless early forms of precancerous polyps or small growths are found.  People who are at an increased risk for hepatitis B should be screened for this virus. You are considered at high risk for hepatitis B if:  You were born in a country where hepatitis B occurs often. Talk with your health care provider about which countries are considered high risk.  Your parents were born in a high-risk country and you have not received a shot to protect against hepatitis B (hepatitis B vaccine).  You have HIV or AIDS.  You use needles to inject street drugs.  You live with, or have sex with, someone who has hepatitis B.  You are a man who has sex with other men (MSM).  You get hemodialysis treatment.  You take certain medicines for conditions like cancer, organ transplantation, and autoimmune conditions.  Hepatitis C blood testing is recommended for all people born from [birth year] through 1967 and any individual with known risk factors for hepatitis C.  Healthy men should no longer receive prostate-specific antigen (PSA) blood tests as part of routine cancer screening. Talk to your health care provider about prostate cancer screening.  Testicular cancer screening is not recommended for adolescents or adult males who have no symptoms. Screening includes self-exam, a health care provider exam, and other screening tests. Consult with your health care provider about any symptoms you have or any concerns you have about testicular cancer.  Practice safe sex. Use condoms and avoid high-risk sexual practices to reduce the spread of sexually transmitted infections (STIs).  You should be screened for STIs, including gonorrhea and chlamydia  if:  You are sexually active and are younger than 24 years.  You are older than 24 years, and your health care provider tells you that you are at risk for this type of infection.  Your sexual activity has changed since you were last screened, and you are at an increased risk for chlamydia or gonorrhea. Ask your health care provider if you are at risk.  If you are at risk of being infected with HIV, it is recommended that you take a prescription medicine daily to prevent HIV infection. This is called pre-exposure prophylaxis (PrEP). You are considered at risk if:  You are a man who has sex with other men (MSM).  You are a heterosexual man who is sexually active with multiple partners.  You take drugs by injection.  You are sexually active with a partner who has HIV.  Talk with your health care provider about whether you are at high risk of being infected with HIV. If you choose to begin PrEP, you should first be tested for HIV. You should then be tested every 3 months for as long as you are taking PrEP.  Use sunscreen. Apply sunscreen liberally and repeatedly throughout the day. You should seek shade when your shadow is shorter than you. Protect yourself by wearing long sleeves, pants, a wide-brimmed hat, and sunglasses year round whenever you are outdoors.  Tell your health care provider of new moles or changes in moles, especially if there is a change in shape or color. Also, tell your health care  provider if a mole is larger than the size of a pencil eraser.  A one-time screening for abdominal aortic aneurysm (AAA) and surgical repair of large AAAs by ultrasound is recommended for men aged 55-75 years who are current or former smokers.  Stay current with your vaccines (immunizations). This information is not intended to replace advice given to you by your health care provider. Make sure you discuss any questions you have with your health care provider. Document Released: 07/17/2007  Document Revised: 02/08/2014 Document Reviewed: 10/22/2014 Elsevier Interactive Patient Education  2017 [employer].

## 2016-03-09 DIAGNOSIS — Z86010 Personal history of colonic polyps: Secondary | ICD-10-CM | POA: Diagnosis not present

## 2016-03-09 DIAGNOSIS — K648 Other hemorrhoids: Secondary | ICD-10-CM | POA: Diagnosis not present

## 2016-03-09 DIAGNOSIS — K635 Polyp of colon: Secondary | ICD-10-CM | POA: Diagnosis not present

## 2016-03-09 DIAGNOSIS — K5730 Diverticulosis of large intestine without perforation or abscess without bleeding: Secondary | ICD-10-CM | POA: Diagnosis not present

## 2016-03-09 DIAGNOSIS — Z1211 Encounter for screening for malignant neoplasm of colon: Secondary | ICD-10-CM | POA: Diagnosis not present

## 2016-03-09 DIAGNOSIS — Z8719 Personal history of other diseases of the digestive system: Secondary | ICD-10-CM | POA: Diagnosis not present

## 2016-03-09 DIAGNOSIS — D128 Benign neoplasm of rectum: Secondary | ICD-10-CM | POA: Diagnosis not present

## 2016-03-09 LAB — HM COLONOSCOPY

## 2016-03-10 ENCOUNTER — Other Ambulatory Visit: Payer: Self-pay | Admitting: Internal Medicine

## 2016-03-10 DIAGNOSIS — K2100 Gastro-esophageal reflux disease with esophagitis, without bleeding: Secondary | ICD-10-CM

## 2016-03-11 ENCOUNTER — Encounter: Payer: Self-pay | Admitting: Internal Medicine

## 2016-03-11 NOTE — Progress Notes (Signed)
Result abstracted 

## 2016-03-17 ENCOUNTER — Encounter: Payer: Self-pay | Admitting: Internal Medicine

## 2016-03-17 ENCOUNTER — Ambulatory Visit (INDEPENDENT_AMBULATORY_CARE_PROVIDER_SITE_OTHER): Payer: PPO | Admitting: Internal Medicine

## 2016-03-17 ENCOUNTER — Other Ambulatory Visit (INDEPENDENT_AMBULATORY_CARE_PROVIDER_SITE_OTHER): Payer: PPO

## 2016-03-17 VITALS — BP 130/80 | HR 84 | Temp 97.8°F | Ht 69.0 in | Wt 165.0 lb

## 2016-03-17 DIAGNOSIS — R739 Hyperglycemia, unspecified: Secondary | ICD-10-CM | POA: Diagnosis not present

## 2016-03-17 DIAGNOSIS — I10 Essential (primary) hypertension: Secondary | ICD-10-CM

## 2016-03-17 DIAGNOSIS — E785 Hyperlipidemia, unspecified: Secondary | ICD-10-CM

## 2016-03-17 DIAGNOSIS — E7800 Pure hypercholesterolemia, unspecified: Secondary | ICD-10-CM

## 2016-03-17 DIAGNOSIS — G4700 Insomnia, unspecified: Secondary | ICD-10-CM

## 2016-03-17 LAB — CBC WITH DIFFERENTIAL/PLATELET
BASOS ABS: 0 10*3/uL (ref 0.0–0.1)
Basophils Relative: 0.7 % (ref 0.0–3.0)
EOS ABS: 0.1 10*3/uL (ref 0.0–0.7)
Eosinophils Relative: 1.5 % (ref 0.0–5.0)
HCT: 47.1 % (ref 39.0–52.0)
HEMOGLOBIN: 16.1 g/dL (ref 13.0–17.0)
Lymphocytes Relative: 27.3 % (ref 12.0–46.0)
Lymphs Abs: 1.5 10*3/uL (ref 0.7–4.0)
MCHC: 34.1 g/dL (ref 30.0–36.0)
MCV: 91.8 fl (ref 78.0–100.0)
MONO ABS: 0.5 10*3/uL (ref 0.1–1.0)
Monocytes Relative: 9.5 % (ref 3.0–12.0)
NEUTROS ABS: 3.4 10*3/uL (ref 1.4–7.7)
Neutrophils Relative %: 61 % (ref 43.0–77.0)
PLATELETS: 220 10*3/uL (ref 150.0–400.0)
RBC: 5.13 Mil/uL (ref 4.22–5.81)
RDW: 13.9 % (ref 11.5–15.5)
WBC: 5.6 10*3/uL (ref 4.0–10.5)

## 2016-03-17 LAB — COMPREHENSIVE METABOLIC PANEL
ALBUMIN: 4.4 g/dL (ref 3.5–5.2)
ALT: 13 U/L (ref 0–53)
AST: 14 U/L (ref 0–37)
Alkaline Phosphatase: 58 U/L (ref 39–117)
BILIRUBIN TOTAL: 0.6 mg/dL (ref 0.2–1.2)
BUN: 17 mg/dL (ref 6–23)
CHLORIDE: 104 meq/L (ref 96–112)
CO2: 28 mEq/L (ref 19–32)
CREATININE: 0.74 mg/dL (ref 0.40–1.50)
Calcium: 9.4 mg/dL (ref 8.4–10.5)
GFR: 108.82 mL/min (ref >60.00)
Glucose, Bld: 110 mg/dL — ABNORMAL HIGH (ref 70–99)
Potassium: 4.6 mEq/L (ref 3.5–5.1)
SODIUM: 139 meq/L (ref 135–145)
Total Protein: 7.3 g/dL (ref 6.0–8.3)

## 2016-03-17 LAB — LIPID PANEL
CHOLESTEROL: 198 mg/dL (ref 0–200)
HDL: 42.8 mg/dL (ref >39.00)
LDL Cholesterol: 146 mg/dL — ABNORMAL HIGH (ref 0–99)
NonHDL: 154.83
TRIGLYCERIDES: 42 mg/dL (ref 0.0–149.0)
Total CHOL/HDL Ratio: 5
VLDL: 8.4 mg/dL (ref 0.0–40.0)

## 2016-03-17 LAB — TSH: TSH: 2.5 u[IU]/mL (ref 0.35–4.50)

## 2016-03-17 LAB — HEMOGLOBIN A1C: HEMOGLOBIN A1C: 6.1 % (ref 4.6–6.5)

## 2016-03-17 MED ORDER — ROSUVASTATIN CALCIUM 10 MG PO TABS: 10.0000 mg | ORAL_TABLET | Freq: Every day | ORAL | 3 refills | Status: DC

## 2016-03-17 MED ORDER — DOXEPIN HCL 3 MG PO TABS: 1.0000 | ORAL_TABLET | Freq: Every evening | ORAL | 3 refills | Status: DC | PRN

## 2016-03-17 NOTE — Progress Notes (Signed)
Pre visit review using our clinic review tool, if applicable. No additional management support is needed unless otherwise documented below in the visit note. 

## 2016-03-17 NOTE — Progress Notes (Signed)
Subjective:  Patient ID: Mark Jordan, male    DOB: [birth year]  Age: 76 y.o. MRN: [id number]  CC: Hyperlipidemia and Hypertension   HPI Mark Jordan presents for Follow-up. He complains of difficulty staying asleep. He doesn't have any trouble falling asleep. He tells me his blood pressure has been well controlled and he has had no recent episodes of CP, DOE, SOB, edema, palpitations, or fatigue.  Outpatient Medications Prior to Visit  Medication Sig Dispense Refill  . ALPRAZolam (XANAX) 0.5 MG tablet TAKE 1/2 TO 1 TABLET BY MOUTH 3 TIMES DAILY AS NEEDED. FOR ANXIETY 90 tablet 3  . amLODipine-benazepril (LOTREL) 10-20 MG capsule TAKE 1 CAPSULE BY MOUTH ONCE DAILY 30 capsule 0  . Omega-3 Fatty Acids (FISH OIL) 1000 MG CPDR Take 1,000 mcg by mouth daily.    Marland Kitchen omeprazole (PRILOSEC) 40 MG capsule TAKE ONE CAPSULE BY MOUTH DAILY 90 capsule 1  . Tamsulosin HCl (FLOMAX) 0.4 MG CAPS Take 0.4 mg by mouth daily after supper.     . vitamin B-12 (CYANOCOBALAMIN) 1000 MCG tablet Take 1,000 mcg by mouth daily. Reported on 03/12/2015    . naproxen (NAPROSYN) 500 MG tablet Take 1 tablet (500 mg total) by mouth 2 (two) times daily with a meal. 60 tablet 0  . gabapentin (NEURONTIN) 300 MG capsule Take 1 capsule (300 mg total) by mouth at bedtime. (Patient not taking: Reported on 03/17/2016) 30 capsule 3   No facility-administered medications prior to visit.     ROS Review of Systems  Constitutional: Negative for appetite change, chills, diaphoresis, fatigue and unexpected weight change.  HENT: Negative.   Eyes: Negative for visual disturbance.  Respiratory: Negative for cough, chest tightness, shortness of breath, wheezing and stridor.   Cardiovascular: Negative for chest pain, palpitations and leg swelling.  Gastrointestinal: Negative for abdominal pain, constipation, diarrhea, nausea and vomiting.  Endocrine: Negative.   Genitourinary: Negative.  Negative for difficulty urinating and hematuria.    Musculoskeletal: Negative for back pain, myalgias and neck pain.  Skin: Negative.   Allergic/Immunologic: Negative.   Neurological: Negative.  Negative for dizziness.  Hematological: Negative.  Negative for adenopathy. Does not bruise/bleed easily.  Psychiatric/Behavioral: Negative.     Objective:  BP 130/80 (BP Location: Left Arm, Patient Position: Sitting, Cuff Size: Normal)   Pulse 84   Temp 97.8 F (36.6 C) (Oral)   Ht 5\' 9"  (1.753 m)   Wt 165 lb (74.8 kg)   SpO2 98%   BMI 24.37 kg/m   BP Readings from Last 3 Encounters:  03/17/16 130/80  03/02/16 136/76  10/08/15 100/72    Wt Readings from Last 3 Encounters:  03/17/16 165 lb (74.8 kg)  03/02/16 168 lb (76.2 kg)  10/08/15 168 lb (76.2 kg)    Physical Exam  Constitutional: He is oriented to person, place, and time. No distress.  HENT:  Mouth/Throat: Oropharynx is clear and moist. No oropharyngeal exudate.  Eyes: Conjunctivae are normal. Right eye exhibits no discharge. Left eye exhibits no discharge. No scleral icterus.  Neck: Normal range of motion. Neck supple. No JVD present. No tracheal deviation present. No thyromegaly present.  Cardiovascular: Normal rate, regular rhythm, normal heart sounds and intact distal pulses.  Exam reveals no gallop and no friction rub.   No murmur heard. Pulmonary/Chest: Effort normal and breath sounds normal. No stridor. No respiratory distress. He has no wheezes. He has no rales. He exhibits no tenderness.  Abdominal: Soft. Bowel sounds are normal. He exhibits no distension  and no mass. There is no tenderness. There is no rebound and no guarding.  Musculoskeletal: Normal range of motion. He exhibits no edema, tenderness or deformity.  Lymphadenopathy:    He has no cervical adenopathy.  Neurological: He is oriented to person, place, and time.  Skin: Skin is warm and dry. No rash noted. He is not diaphoretic. No erythema. No pallor.  Vitals reviewed.   Lab Results  Component Value  Date   WBC 5.6 03/17/2016   HGB 16.1 03/17/2016   HCT 47.1 03/17/2016   PLT 220.0 03/17/2016   GLUCOSE 110 (H) 03/17/2016   CHOL 198 03/17/2016   TRIG 42.0 03/17/2016   HDL 42.80 03/17/2016   LDLDIRECT 144.2 02/07/2013   LDLCALC 146 (H) 03/17/2016   ALT 13 03/17/2016   AST 14 03/17/2016   NA 139 03/17/2016   K 4.6 03/17/2016   CL 104 03/17/2016   CREATININE 0.74 03/17/2016   BUN 17 03/17/2016   CO2 28 03/17/2016   TSH 2.50 03/17/2016   PSA 0.80 02/07/2013   INR 0.97 09/10/2009   HGBA1C 6.1 03/17/2016    Ct Abdomen Pelvis Wo Contrast  Result Date: 07/10/2015 CLINICAL DATA:  Pelvic pain for 1 year.  Bladder wall thickening. EXAM: CT ABDOMEN AND PELVIS WITHOUT CONTRAST TECHNIQUE: Multidetector CT imaging of the abdomen and pelvis was performed following the standard protocol without IV contrast. COMPARISON:  CT abdomen pelvis -01/30/2014 FINDINGS: The lack of intravenous contrast limits the ability to evaluate solid abdominal organs. Lower chest: Limited visualization of the lower thorax demonstrates tortuosity of the descending thoracic aorta. Minimal subsegmental atelectasis within the imaged caudal aspect of the lingula, unchanged since the 2015 examination. No new focal airspace opacities. No pleural effusion. Normal heart size.  No pericardial effusion. Hepatobiliary: Normal hepatic contour. Normal noncontrast appearance of the gallbladder given degree distention. No definite radiopaque gallstones. No ascites. Pancreas: Normal noncontrast appearance of the pancreas. Spleen: Normal noncontrast appearance of the spleen Adrenals/Urinary Tract: Punctate (approximately 3 mm) nonobstructing stone within the interpolar aspect of the right kidney (image 32, series 3, unchanged since the 2015 examination. No left-sided renal stones. No renal stones are seen along the expected course of either ureter. Partially exophytic lesion arising from the superior pole of the right kidney has increased in  size in the interval, currently measuring 2.0 x 2.7 cm, previously, 1.4 x 1.9 cm. This lesion is incompletely characterized on this noncontrast examination. Re- demonstrated markedly abnormal appearance of the urinary bladder. Suspected minimal increase in size of the dominant now approximately 6.1 x 2.7 cm diverticulum arising from the right-sided the urinary bladder, previously, 4.7 x 2.5 cm. Additionally, there has been interval increase in size of dominant diverticulum arising from the superior aspect of the left side of the dome of the urinary bladder, currently measuring 2.4 x 2.1 cm (image 58, series 3), previously, 1.9 x 1.5 cm. Irregular wall thickening involving primarily the posterior cranial aspect of the urinary bladder wall (representative images 61, series 3, coronal image 57, series 601) appears grossly unchanged since the 01/2014 examination. There are 2 punctate opacities lying dependently within the urinary bladder (image 69, series 3) which are favored to represent bladder stones. Normal appearance of the bilateral adrenal glands. Stomach/Bowel: Ingested enteric contrast extends to the level of the rectum. A large stool burden within the rectal fall. Extensive colonic diverticulosis without evidence of diverticulitis on this noncontrast examination. Small hiatal hernia. Normal noncontrast appearance of the terminal ileum and retrocecal appendix. No  pneumoperitoneum, pneumatosis or portal venous gas. Vascular/Lymphatic: Normal caliber of the abdominal aorta. No bulky retroperitoneal, mesenteric, pelvic or inguinal lymphadenopathy on this noncontrast examination. Reproductive: The prostate is enlarged measuring 6.6 x 4.0 cm Other: Post left-sided inguinal hernia repair. Musculoskeletal: No acute or aggressive osseous abnormalities. Moderate multilevel lumbar spine DDD, worse at L4-L5 and L5-S1 with disc space height loss, endplate irregularity and sclerosis. IMPRESSION: 1. Re- demonstrated urinary  bladder diverticuli with irregular thickening primarily involving the posterior cranial aspect of the urinary bladder wall, unchanged to minimally progressed since the 01/2014 examination - while potentially secondary to chronic bladder outlet obstruction, an underlying lesion is not excluded on the basis of this examination. If not recently performed, further evaluation with urine cytology and/or cystoscopy could be performed as indicated. 2. Prostatomegaly. 3. Interval increase in size of the now approximately 2.7 cm hypo attenuating lesion arising from the superior pole of the right kidney, previously, 1.9 cm - while potentially representative of increased size of a benign renal cyst, this lesion is incompletely characterized on this noncontrast examination. Further evaluation with renal ultrasound could be performed as clinically indicated. 4. Suspected punctate (approximately 2 mm) urinary bladder stones. 5. Unchanged punctate (approximately 0.3 cm) nonobstructing right-sided renal stone. 6. Extensive colonic diverticulosis without evidence of diverticulitis. 7. Large colonic stool burden within the rectal vault. 8. Small hiatal hernia. Electronically Signed   By: Sandi Mariscal M.D.   On: 07/10/2015 15:40    Assessment & Plan:   Wilmon was seen today for hyperlipidemia and hypertension.  Diagnoses and all orders for this visit:  Essential hypertension- his blood pressure is well-controlled, electrolytes and renal function are normal. -     Comprehensive metabolic panel; Future -     CBC with Differential/Platelet; Future  HYPERCHOLESTEROLEMIA -     Lipid panel; Future -     TSH; Future  Hyperglycemia- his A1c is up to 6.1%, he is prediabetic, no medications are needed but he does agree to work on his lifestyle modifications. -     Comprehensive metabolic panel; Future -     Hemoglobin A1c; Future  Insomnia, persistent -     Doxepin HCl (SILENOR) 3 MG TABS; Take 1 tablet (3 mg total) by mouth at  bedtime as needed.  Hyperlipidemia with target LDL less than 100- he has an elevated Framingham risk score so Avastin to start a statin for cardiovascular risk reduction. -     rosuvastatin (CRESTOR) 10 MG tablet; Take 1 tablet (10 mg total) by mouth daily.   I have discontinued Mr. Rubi's gabapentin and naproxen. I am also having him start on Doxepin HCl and rosuvastatin. Additionally, I am having him maintain his tamsulosin, vitamin B-12, ALPRAZolam, Fish Oil, amLODipine-benazepril, and omeprazole.  Meds ordered this encounter  Medications  . Doxepin HCl (SILENOR) 3 MG TABS    Sig: Take 1 tablet (3 mg total) by mouth at bedtime as needed.    Dispense:  90 tablet    Refill:  3  . rosuvastatin (CRESTOR) 10 MG tablet    Sig: Take 1 tablet (10 mg total) by mouth daily.    Dispense:  90 tablet    Refill:  3     Follow-up: Return in about 6 months (around 09/14/2016).  Scarlette Calico, MD

## 2016-03-17 NOTE — Patient Instructions (Signed)
Insomnia Insomnia is a sleep disorder that makes it difficult to fall asleep or to stay asleep. Insomnia can cause tiredness (fatigue), low energy, difficulty concentrating, mood swings, and poor performance at work or school. There are three different ways to classify insomnia:  Difficulty falling asleep.  Difficulty staying asleep.  Waking up too early in the morning. Any type of insomnia can be long-term (chronic) or short-term (acute). Both are common. Short-term insomnia usually lasts for three months or less. Chronic insomnia occurs at least three times a week for longer than three months. What are the causes? Insomnia may be caused by another condition, situation, or substance, such as:  Anxiety.  Certain medicines.  Gastroesophageal reflux disease (GERD) or other gastrointestinal conditions.  Asthma or other breathing conditions.  Restless legs syndrome, sleep apnea, or other sleep disorders.  Chronic pain.  Menopause. This may include hot flashes.  Stroke.  Abuse of alcohol, tobacco, or illegal drugs.  Depression.  Caffeine.  Neurological disorders, such as Alzheimer disease.  An overactive thyroid (hyperthyroidism). The cause of insomnia may not be known. What increases the risk? Risk factors for insomnia include:  Gender. Women are more commonly affected than men.  Age. Insomnia is more common as you get older.  Stress. This may involve your professional or personal life.  Income. Insomnia is more common in people with lower income.  Lack of exercise.  Irregular work schedule or night shifts.  Traveling between different time zones. What are the signs or symptoms? If you have insomnia, trouble falling asleep or trouble staying asleep is the main symptom. This may lead to other symptoms, such as:  Feeling fatigued.  Feeling nervous about going to sleep.  Not feeling rested in the morning.  Having trouble concentrating.  Feeling irritable,  anxious, or depressed. How is this treated? Treatment for insomnia depends on the cause. If your insomnia is caused by an underlying condition, treatment will focus on addressing the condition. Treatment may also include:  Medicines to help you sleep.  Counseling or therapy.  Lifestyle adjustments. Follow these instructions at home:  Take medicines only as directed by your health care provider.  Keep regular sleeping and waking hours. Avoid naps.  Keep a sleep diary to help you and your health care provider figure out what could be causing your insomnia. Include:  When you sleep.  When you wake up during the night.  How well you sleep.  How rested you feel the next day.  Any side effects of medicines you are taking.  What you eat and drink.  Make your bedroom a comfortable place where it is easy to fall asleep:  Put up shades or special blackout curtains to block light from outside.  Use a white noise machine to block noise.  Keep the temperature cool.  Exercise regularly as directed by your health care provider. Avoid exercising right before bedtime.  Use relaxation techniques to manage stress. Ask your health care provider to suggest some techniques that may work well for you. These may include:  Breathing exercises.  Routines to release muscle tension.  Visualizing peaceful scenes.  Cut back on alcohol, caffeinated beverages, and cigarettes, especially close to bedtime. These can disrupt your sleep.  Do not overeat or eat spicy foods right before bedtime. This can lead to digestive discomfort that can make it hard for you to sleep.  Limit screen use before bedtime. This includes:  Watching TV.  Using your smartphone, tablet, and computer.  Stick to a   routine. This can help you fall asleep faster. Try to do a quiet activity, brush your teeth, and go to bed at the same time each night.  Get out of bed if you are still awake after 15 minutes of trying to  sleep. Keep the lights down, but try reading or doing a quiet activity. When you feel sleepy, go back to bed.  Make sure that you drive carefully. Avoid driving if you feel very sleepy.  Keep all follow-up appointments as directed by your health care provider. This is important. Contact a health care provider if:  You are tired throughout the day or have trouble in your daily routine due to sleepiness.  You continue to have sleep problems or your sleep problems get worse. Get help right away if:  You have serious thoughts about hurting yourself or someone else. This information is not intended to replace advice given to you by your health care provider. Make sure you discuss any questions you have with your health care provider. Document Released: 01/16/2000 Document Revised: 06/20/2015 Document Reviewed: 10/19/2013 Elsevier Interactive Patient Education  2017 [employer]  

## 2016-04-19 ENCOUNTER — Other Ambulatory Visit: Payer: Self-pay | Admitting: Internal Medicine

## 2016-04-20 ENCOUNTER — Ambulatory Visit (INDEPENDENT_AMBULATORY_CARE_PROVIDER_SITE_OTHER): Payer: PPO | Admitting: Family

## 2016-04-20 ENCOUNTER — Encounter: Payer: Self-pay | Admitting: Family

## 2016-04-20 VITALS — BP 150/88 | HR 63 | Temp 98.2°F | Resp 16 | Ht 69.0 in | Wt 166.8 lb

## 2016-04-20 DIAGNOSIS — M25511 Pain in right shoulder: Secondary | ICD-10-CM | POA: Diagnosis not present

## 2016-04-20 DIAGNOSIS — M7021 Olecranon bursitis, right elbow: Secondary | ICD-10-CM | POA: Insufficient documentation

## 2016-04-20 MED ORDER — DICLOFENAC SODIUM 2 % TD SOLN: 1.0000 "application " | Freq: Two times a day (BID) | TRANSDERMAL | 0 refills | Status: DC | PRN

## 2016-04-20 NOTE — Patient Instructions (Signed)
Thank you for choosing [facility].  SUMMARY AND INSTRUCTIONS:  Ice to the top of your shoulder and elbow for 20 minutes every 2 hours and after activity and before bed.   Tylenol 650 mg and Ibuprofen 400-600 mg 3x daily for the next 4-5 days.  Stretches and exercises.  Follow up with orthopedics if symptoms worsen or do not improve.   Medication:  Your prescription(s) have been submitted to your pharmacy or been printed and provided for you. Please take as directed and contact our office if you believe you are having problem(s) with the medication(s) or have any questions.  Follow up:  If your symptoms worsen or fail to improve, please contact our office for further instruction, or in case of emergency go directly to the emergency room at the closest medical facility.    Shoulder Separation A shoulder separation (acromioclavicular separation) is an injury to the connecting tissue (ligament) between the top of your shoulder blade (acromion) and your collarbone (clavicle). The ligament may be stretched, partially torn, or completely torn.  A stretched ligament may not cause very much pain, and it does not move the collarbone out of place. A stretched ligament looks normal on an X-Natanel.  An injury that is a bit worse may partially tear a ligament and move the collarbone slightly out of place.  A serious injury completely tears both shoulder ligaments. This moves the collarbone severely out of position and changes the way that the shoulder looks (deformity). What are the causes? The most common cause of a shoulder separation is falling on or receiving a blow to the top of the shoulder. Falling with an outstretched arm may also cause this injury. What increases the risk? You may be at greater risk of a shoulder separation if:  You are male.  You are younger than age 31.  You play a contact sport, such as football or hockey. What are the signs or symptoms? The most common  symptom of a shoulder separation is pain on the top of the shoulder after falling on it or receiving a blow to it. Other signs and symptoms include:  Shoulder deformity.  Swelling of the shoulder.  Decreased ability to move the shoulder.  Bruising on top of the shoulder. How is this diagnosed? Your health care provider may suspect a shoulder separation based on your symptoms and the details of a recent injury. A physical exam will be done. During this exam, the health care provider may:  Press on your shoulder.  Test the movement of your shoulder.  Ask you to hold a weight in your hand to see if the separation increases.  Do an X-Darel. How is this treated?  A stretch injury may require only a sling, pain medicine, and cold packs. This treatment may last for 2-12 weeks. You may also have physical therapy. A physical therapist will teach you to do daily exercises to strengthen your shoulder muscles and prevent stiffness.  A complete tear may require surgery to repair the torn ligament. After surgery, you will also require a sling, pain medicine, and cold packs. Recovery may take longer. You may also need more physical therapy. Follow these instructions at home:  Take medicines only as directed by your health care provider.  Apply ice to the top of your shoulder:  Put ice in a plastic bag.  Place a towel between your skin and the bag.  Leave the ice on for 20 minutes, 2-3 times a day.  Wear your sling  or splint as directed by your health care provider.  You may be able to remove your sling to do your physical therapy exercises.  Ask your health care provider when you can stop wearing the sling.  Do not do any activities that make your pain worse.  Do not lift anything that is heavier than 10 lb (4.5 kg) on the injured side of your body.  Ask your health care provider when you can return to athletic activities. Contact a health care provider if:  Your pain medicine is not  relieving your pain.  Your pain and stiffness are not improving after 2 weeks.  You are unable to do your physical therapy exercises because of pain or stiffness. This information is not intended to replace advice given to you by your health care provider. Make sure you discuss any questions you have with your health care provider. Document Released: 10/28/2004 Document Revised: 09/14/2015 Document Reviewed: 06/20/2013 Elsevier Interactive Patient Education  2017 [employer]   Elbow Bursitis Rehab Ask your health care provider which exercises are safe for you. Do exercises exactly as told by your health care provider and adjust them as directed. It is normal to feel mild stretching, pulling, tightness, or discomfort as you do these exercises, but you should stop right away if you feel sudden pain or your pain gets worse. Do not begin these exercises until told by your health care provider. Stretching and range of motion exercises These exercises warm up your muscles and joints and improve the movement and flexibility of your elbow. These exercises also help to relieve pain and swelling. Exercise A: Passive elbow flexion   1. Lie on your back. 2. Extend your left / right arm up into the air, bracing it with your other hand. Allow your left / right arm to relax. 3. Let your left / right elbow bend, allowing your hand to fall slowly toward your chest. You should feel a gentle stretch along the back of your upper arm and elbow. 4. If told by your health care provider, hold a __________ hand weight to increase the intensity of this stretch. 5. Hold this position for __________ seconds. 6. Slowly return your left / right arm to the upright position. Repeat __________ times. Complete this exercise __________ times a day. Exercise B: Passive elbow extension   1. Lie on your back on a firm bed. Make sure that you are in a comfortable position that allows you relax your arm muscles. 2. Place a  folded towel under your left / right upper arm so that your elbow and shoulder are at the same height. 3. Extend your left / right arm so your elbow and hand do not rest on the bed or towel. 4. Let the weight of your hand straighten your elbow. Keep your arm and chest muscles relaxed. You should feel a stretch on the inside of your elbow. 5. If told by your health care provider, increase the intensity of your stretch by adding a small wrist weight or hand weight. 6. Hold this position for __________ seconds. 7. Slowly return to the starting position. Repeat __________ times. Complete this exercise __________ times a day. Strengthening exercises These exercises build strength and endurance in your elbow. Endurance is the ability to use your muscles for a long time, even after they get tired. Exercise C: Elbow extensors, isometric   1. Stand or sit upright on a firm surface. 2. Place your left / right arm so your palm faces  your abdomen, at the height of your waist. 3. Place your other hand on the underside of your forearm. Slowly push your left / right arm down, like you were going to straighten your elbow. Resist with your other hand. Push as hard as you can without causing any pain or movement at your left / right elbow. 4. Hold this position for __________ seconds. 5. Slowly release the tension in both arms. 6. Let your muscles relax completely before repeating. Repeat __________ times. Complete this exercise __________ times a day. Exercise D: Elbow flexors, isometric  1. Stand or sit upright on a firm surface. 2. Place your left / right arm so your hand is palm-up, at the height of your waist. 3. Place your other hand on top of your forearm. Gently push up with your left / right arm, like you were going to bend your elbow. Resist this motion with your other hand. Push as hard as you can without causing any pain or movement at your left / right elbow. 4. Hold this position for __________  seconds. 5. Slowly release the tension in both arms. 6. Let your muscles relax completely before repeating. Repeat __________ times. Complete this exercise __________ times a day. This information is not intended to replace advice given to you by your health care provider. Make sure you discuss any questions you have with your health care provider. Document Released: 01/18/2005 Document Revised: 09/23/2015 Document Reviewed: 10/17/2014 Elsevier Interactive Patient Education  2017 [employer].

## 2016-04-20 NOTE — Assessment & Plan Note (Signed)
Symptoms and exam consistent with olecranon bursitis of the right elbow. Treat conservatively with ice, elevation, and over-the-counter anti-inflammatories as needed. Referral to orthopedics place if symptoms worsen or do not improve. May require drainage and or possible removal.

## 2016-04-20 NOTE — Progress Notes (Signed)
Subjective:    Patient ID: Mark Jordan, male    DOB: January 13, 1941, 76 y.o.   MRN: [id number]  Chief Complaint  Patient presents with  . Elbow fluid    States he thinks there is fluid in his right elbow x1 week also right shoulder pain that started bothering him     HPI:  Mark Jordan is a 76 y.o. male who  has a past medical history of Allergic rhinitis; Anxiety; Chronic low back pain; Diverticulosis of colon; GERD (gastroesophageal reflux disease); HTN (hypertension); Hypercholesterolemia; Kidney calculus; and PUD (peptic ulcer disease). and presents today for an acute office visit.   1.) Right shoulder - This is a new problem. Associated symptom of pain located in his right shoulder has been going off and on for about 1 year following walking into a door. Pain is described as sore and waxes and wanes. No limitations in motion. No new trama or injury. No numbness or tingling. Modifying factors include lineament ointment which does help. He is right side dominant. 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   2.) Right elbow - This is a new problem. Associated symptom of a pocket of fluid located in his right elbow has been going on for about 1 week. Denies any trauma or injury. No real pain. No restrictions in motion or strength.    No Known Allergies    Outpatient Medications Prior to Visit  Medication Sig Dispense Refill  . ALPRAZolam (XANAX) 0.5 MG tablet TAKE 1/2 TO 1 TABLET BY MOUTH 3 TIMES DAILY AS NEEDED. FOR ANXIETY 90 tablet 3  . amLODipine-benazepril (LOTREL) 10-20 MG capsule TAKE 1 CAPSULE BY MOUTH ONCE DAILY 30 capsule 5  . Doxepin HCl (SILENOR) 3 MG TABS Take 1 tablet (3 mg total) by mouth at bedtime as needed. 90 tablet 3  . Omega-3 Fatty Acids (FISH OIL) 1000 MG CPDR Take 1,000 mcg by mouth daily.    Marland Kitchen omeprazole (PRILOSEC) 40 MG capsule TAKE ONE CAPSULE BY MOUTH DAILY 90 capsule 1  . rosuvastatin (CRESTOR) 10 MG tablet Take 1 tablet (10 mg total) by mouth daily. 90  tablet 3  . Tamsulosin HCl (FLOMAX) 0.4 MG CAPS Take 0.4 mg by mouth daily after supper.     . vitamin B-12 (CYANOCOBALAMIN) 1000 MCG tablet Take 1,000 mcg by mouth daily. Reported on 03/12/2015     No facility-administered medications prior to visit.       Past Surgical History:  Procedure Laterality Date  . COLONOSCOPY  2014  . INGUINAL HERNIA REPAIR     left  . LEG SURGERY    . ROTATOR CUFF REPAIR  5/08   left ; Dr Onnie Graham  . TOTAL SHOULDER ARTHROPLASTY  8/11   after fall w/ prox humerus fx      Past Medical History:  Diagnosis Date  . Allergic rhinitis   . Anxiety   . Chronic low back pain   . Diverticulosis of colon   . GERD (gastroesophageal reflux disease)   . HTN (hypertension)   . Hypercholesterolemia   . Kidney calculus   . PUD (peptic ulcer disease)       Review of Systems  Constitutional: Negative for chills and fever.  Respiratory: Negative for chest tightness and shortness of breath.   Musculoskeletal:       Positive for shoulder pain and elbow swelling.  Neurological: Negative for weakness.      Objective:    BP (!) 150/88 (BP Location: Left  Arm, Patient Position: Sitting, Cuff Size: Normal)   Pulse 63   Temp 98.2 F (36.8 C) (Oral)   Resp 16   Ht 5\' 9"  (1.753 m)   Wt 166 lb 12.8 oz (75.7 kg)   SpO2 94%   BMI 24.63 kg/m  Nursing note and vital signs reviewed.  Physical Exam  Constitutional: He is oriented to person, place, and time. He appears well-developed and well-nourished. No distress.  Cardiovascular: Normal rate, regular rhythm, normal heart sounds and intact distal pulses.   Pulmonary/Chest: Effort normal and breath sounds normal.  Musculoskeletal:  Right elbow - obvious deformity with no discoloration. No palpable tenderness able to be elicited with noticeable olecranon bursa. It is mobile and firm to the touch. No evidence of heat or signs of infection.  Right shoulder - no obvious deformity, discoloration, or edema. Palpable  tenderness over acromioclavicular joints with no crepitus or deformity. Range of motion within normal limits. Distal pulses and sensation are intact and appropriate. Strength is normal.  Neurological: He is alert and oriented to person, place, and time.  Skin: Skin is warm and dry.  Psychiatric: He has a normal mood and affect. His behavior is normal. Judgment and thought content normal.       Assessment & Plan:   Problem List Items Addressed This Visit      Musculoskeletal and Integument   Olecranon bursitis of right elbow - Primary    Symptoms and exam consistent with olecranon bursitis of the right elbow. Treat conservatively with ice, elevation, and over-the-counter anti-inflammatories as needed. Referral to orthopedics place if symptoms worsen or do not improve. May require drainage and or possible removal.      Relevant Medications   Diclofenac Sodium (PENNSAID) 2 % SOLN   Other Relevant Orders   Ambulatory referral to Orthopedic Surgery     Other   Arthralgia of right acromioclavicular joint    Symptoms and exam consistent with inflammation of the right acromioclavicular joint most likely associated with trauma. Treat conservatively with ice and over-the-counter anti-inflammatories as needed for symptom relief and supportive care. Sample of Pennsaid provided. Make require cortisone injection. Follow-up if symptoms worsen or do not improve.      Relevant Medications   Diclofenac Sodium (PENNSAID) 2 % SOLN   Other Relevant Orders   Ambulatory referral to Orthopedic Surgery       I am having Mark Jordan start on Diclofenac Sodium. I am also having him maintain his tamsulosin, vitamin B-12, ALPRAZolam, Fish Oil, omeprazole, Doxepin HCl, rosuvastatin, and amLODipine-benazepril.   Follow-up: Return if symptoms worsen or fail to improve.  Mauricio Po, FNP

## 2016-04-20 NOTE — Assessment & Plan Note (Addendum)
Symptoms and exam consistent with inflammation of the right acromioclavicular joint most likely associated with trauma. Treat conservatively with ice and over-the-counter anti-inflammatories as needed for symptom relief and supportive care. Sample of Pennsaid provided. Make require cortisone injection. Follow-up if symptoms worsen or do not improve.

## 2016-04-29 DIAGNOSIS — M25511 Pain in right shoulder: Secondary | ICD-10-CM | POA: Diagnosis not present

## 2016-04-29 DIAGNOSIS — M7021 Olecranon bursitis, right elbow: Secondary | ICD-10-CM | POA: Diagnosis not present

## 2016-04-29 DIAGNOSIS — M7541 Impingement syndrome of right shoulder: Secondary | ICD-10-CM | POA: Diagnosis not present

## 2016-06-25 IMAGING — CT CT ABD-PELV W/O CM
3 of 4 series · 10 of 36 positions shown, 16 images · IV contrast (OMNI 300/WATER)
Comparison: CT abdomen pelvis -01/30/2014

CLINICAL DATA: Pelvic pain for 1 year.  Bladder wall thickening.

EXAM:
CT ABDOMEN AND PELVIS WITHOUT CONTRAST
TECHNIQUE: Multidetector CT imaging of the abdomen and pelvis was performed
following the standard protocol without IV contrast.

[Series 3: abd/pelvis w/o · axial · non-contrast · 0.73mm/px · z∈[-302,+18]mm · 8 of 84 slices shown, 13 images]
[im 10/84  soft-tissue]
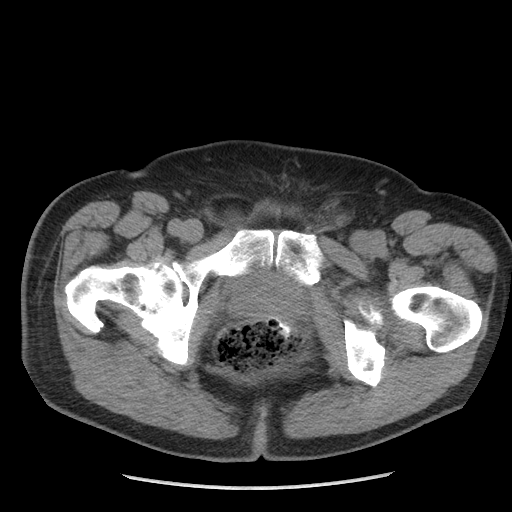
[im 10/84  bone]
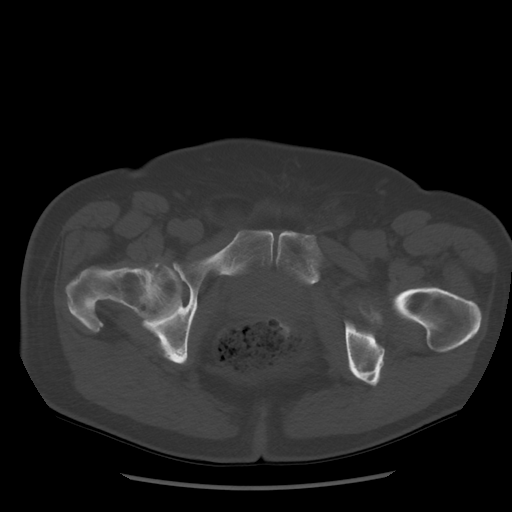
[im 19/84  soft-tissue]
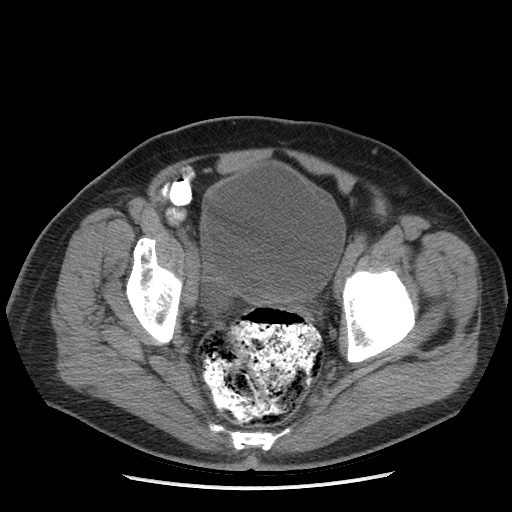
[im 28/84  soft-tissue]
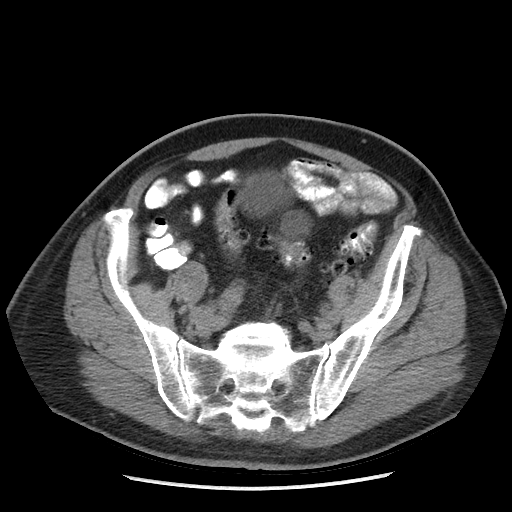
[im 37/84  soft-tissue]
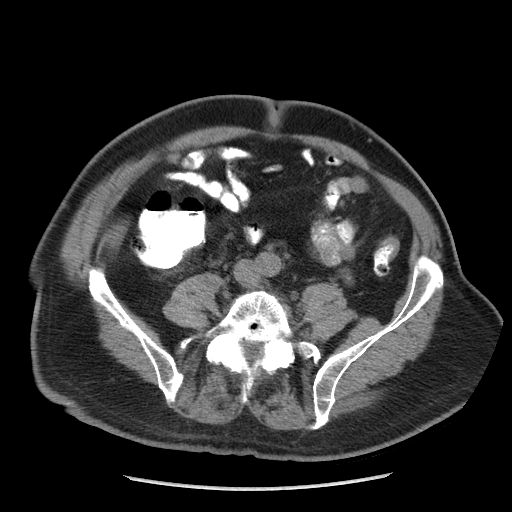
[im 47/84  soft-tissue]
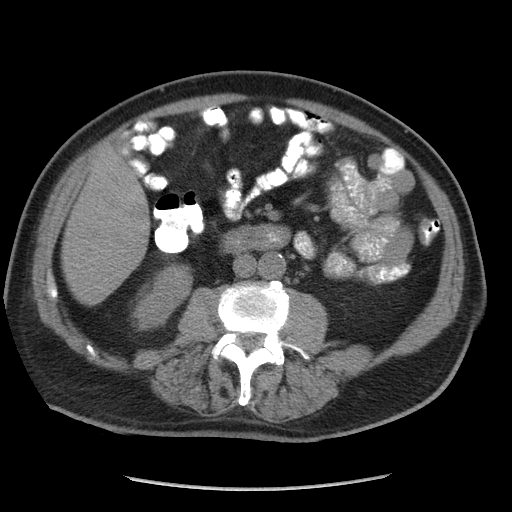
[im 47/84  lung]
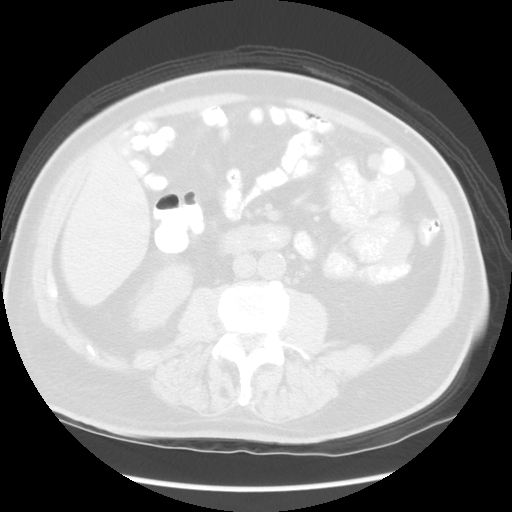
[im 56/84  soft-tissue]
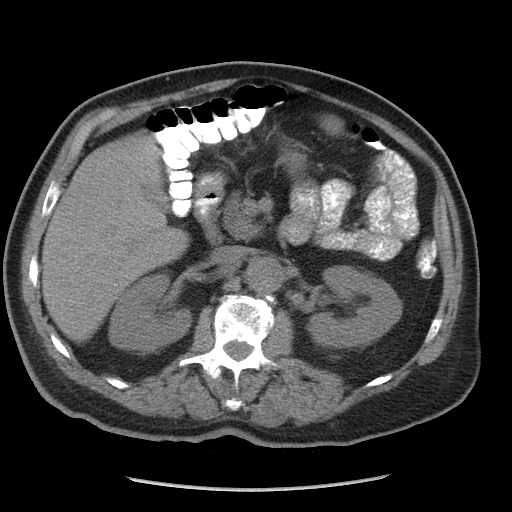
[im 56/84  lung]
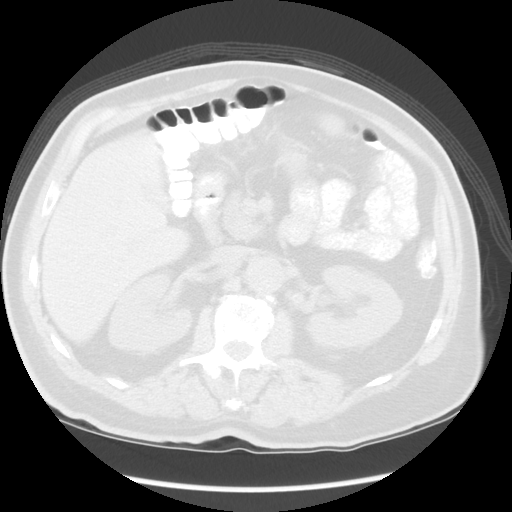
[im 65/84  soft-tissue]
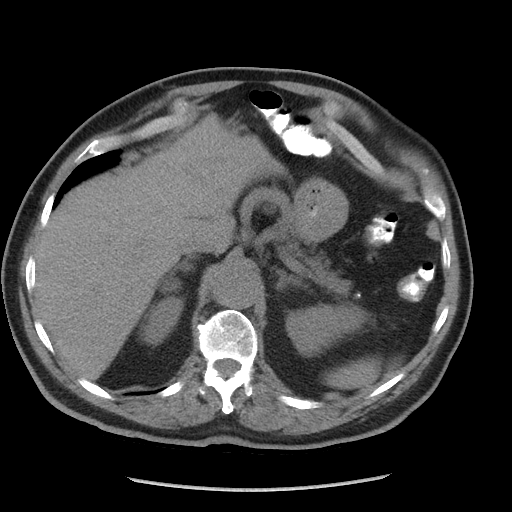
[im 65/84  lung]
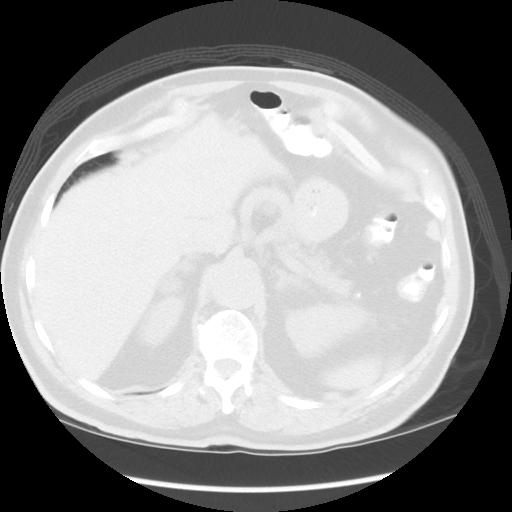
[im 74/84  soft-tissue]
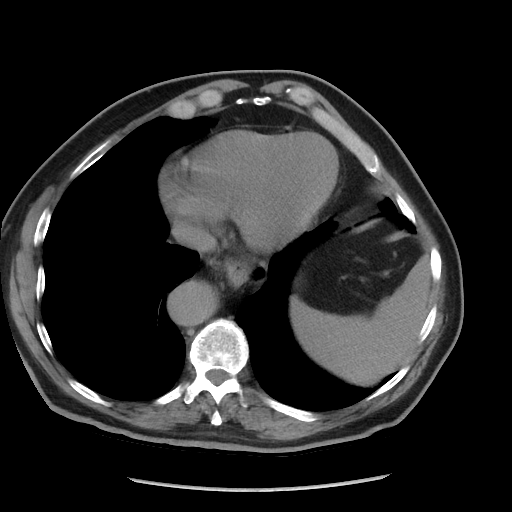
[im 74/84  lung]
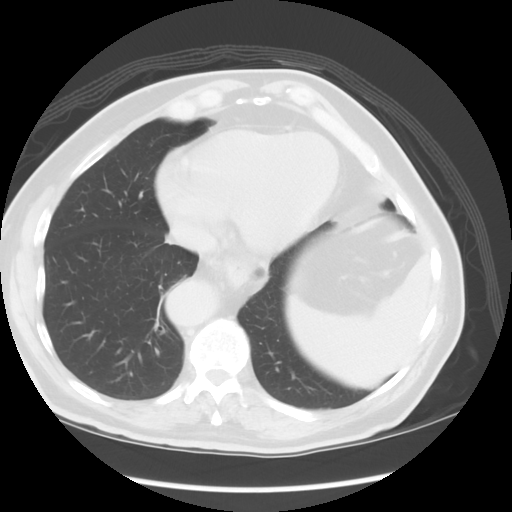

[Series 601: coronal body · coronal · 0.87mm/px · 1 of 123 slices shown, 2 images]
[im 41/123  soft-tissue]
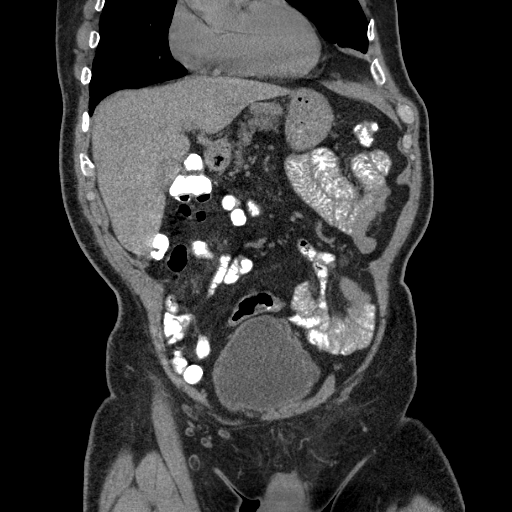
[im 41/123  bone]
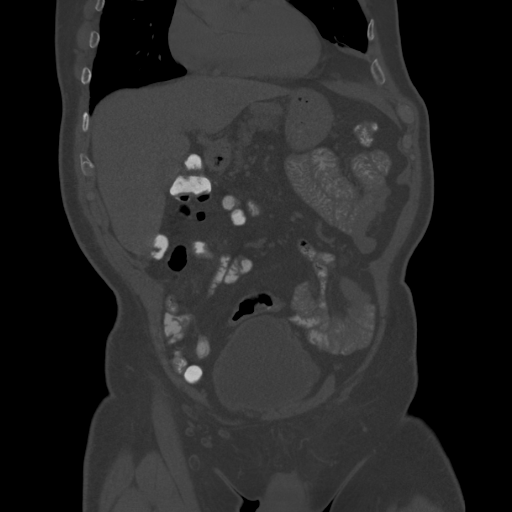

[Series 602: sagittal body · sagittal · 0.87mm/px · 1 of 151 slices shown]
[im 18/151  soft-tissue]
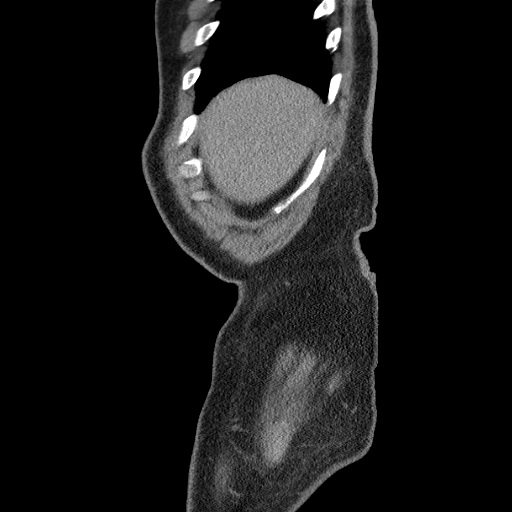

[10 of 36 positions shown; findings below may reference images not displayed]

FINDINGS: The lack of intravenous contrast limits the ability to evaluate
solid abdominal organs.

Lower chest: Limited visualization of the lower thorax demonstrates
tortuosity of the descending thoracic aorta. Minimal subsegmental
atelectasis within the imaged caudal aspect of the lingula,
unchanged since the 3797 examination. No new focal airspace
opacities. No pleural effusion.

Normal heart size.  No pericardial effusion.

Hepatobiliary: Normal hepatic contour. Normal noncontrast appearance
of the gallbladder given degree distention. No definite radiopaque
gallstones. No ascites.

Pancreas: Normal noncontrast appearance of the pancreas.

Spleen: Normal noncontrast appearance of the spleen

Adrenals/Urinary Tract: Punctate (approximately 3 mm) nonobstructing
stone within the interpolar aspect of the right kidney (image 32,
series 3, unchanged since the 3797 examination. No left-sided renal
stones. No renal stones are seen along the expected course of either
ureter.

Partially exophytic lesion arising from the superior pole of the
right kidney has increased in size in the interval, currently
measuring 2.0 x 2.7 cm, previously, 1.4 x 1.9 cm. This lesion is
incompletely characterized on this noncontrast examination.

Re- demonstrated markedly abnormal appearance of the urinary
bladder. Suspected minimal increase in size of the dominant now
approximately 6.1 x 2.7 cm diverticulum arising from the right-sided
the urinary bladder, previously, 4.7 x 2.5 cm. Additionally, there
has been interval increase in size of dominant diverticulum arising
from the superior aspect of the left side of the dome of the urinary
bladder, currently measuring 2.4 x 2.1 cm (image 58, series 3),
previously, 1.9 x 1.5 cm. Irregular wall thickening involving
primarily the posterior cranial aspect of the urinary bladder wall
appears grossly unchanged since the [DATE] examination.

There are 2 punctate opacities lying dependently within the urinary
bladder (image 69, series 3) which are favored to represent bladder
stones.

Normal appearance of the bilateral adrenal glands.

Stomach/Bowel: Ingested enteric contrast extends to the level of the
rectum. A large stool burden within the rectal fall. Extensive
colonic diverticulosis without evidence of diverticulitis on this
noncontrast examination. Small hiatal hernia. Normal noncontrast
appearance of the terminal ileum and retrocecal appendix. No
pneumoperitoneum, pneumatosis or portal venous gas.

Vascular/Lymphatic: Normal caliber of the abdominal aorta.

No bulky retroperitoneal, mesenteric, pelvic or inguinal
lymphadenopathy on this noncontrast examination.

Reproductive: The prostate is enlarged measuring 6.6 x 4.0 cm

Other: Post left-sided inguinal hernia repair.

Musculoskeletal: No acute or aggressive osseous abnormalities.
Moderate multilevel lumbar spine DDD, worse at L4-L5 and L5-S1 with
disc space height loss, endplate irregularity and sclerosis.
IMPRESSION: 1. Re- demonstrated urinary bladder diverticuli with irregular
thickening primarily involving the posterior cranial aspect of the
urinary bladder wall, unchanged to minimally progressed since the
outlet obstruction, an underlying lesion is not excluded on the
basis of this examination. If not recently performed, further
evaluation with urine cytology and/or cystoscopy could be performed
as indicated.
2. Prostatomegaly.
3. Interval increase in size of the now approximately 2.7 cm hypo
attenuating lesion arising from the superior pole of the right
kidney, previously, 1.9 cm - while potentially representative of
increased size of a benign renal cyst, this lesion is incompletely
characterized on this noncontrast examination. Further evaluation
with renal ultrasound could be performed as clinically indicated.
4. Suspected punctate (approximately 2 mm) urinary bladder stones.
5. Unchanged punctate (approximately 0.3 cm) nonobstructing
right-sided renal stone.
6. Extensive colonic diverticulosis without evidence of
diverticulitis.
7. Large colonic stool burden within the rectal vault.
8. Small hiatal hernia.

## 2016-06-30 DIAGNOSIS — M7021 Olecranon bursitis, right elbow: Secondary | ICD-10-CM | POA: Diagnosis not present

## 2016-07-21 ENCOUNTER — Encounter: Payer: Self-pay | Admitting: Internal Medicine

## 2016-07-21 ENCOUNTER — Ambulatory Visit (INDEPENDENT_AMBULATORY_CARE_PROVIDER_SITE_OTHER): Payer: PPO | Admitting: Internal Medicine

## 2016-07-21 VITALS — BP 110/78 | HR 58 | Temp 97.8°F | Resp 16 | Ht 68.4 in | Wt 162.2 lb

## 2016-07-21 DIAGNOSIS — E785 Hyperlipidemia, unspecified: Secondary | ICD-10-CM

## 2016-07-21 DIAGNOSIS — R0789 Other chest pain: Secondary | ICD-10-CM | POA: Diagnosis not present

## 2016-07-21 DIAGNOSIS — I10 Essential (primary) hypertension: Secondary | ICD-10-CM

## 2016-07-21 NOTE — Patient Instructions (Signed)

## 2016-07-21 NOTE — Progress Notes (Signed)
Subjective:  Patient ID: Mark Jordan, male    DOB: 07/16/1940  Age: 76 y.o. MRN: [id number]  CC: Hypertension   HPI Mark Jordan presents for f/up - He complains of intermittent episodes of bilateral, upper, anterior chest pain that he describes as stiffness that occurs at rest. He has had this intermittently for several years. There is never an exertional component and he denies diaphoresis, fatigue, or shortness of breath.  Outpatient Medications Prior to Visit  Medication Sig Dispense Refill  . ALPRAZolam (XANAX) 0.5 MG tablet TAKE 1/2 TO 1 TABLET BY MOUTH 3 TIMES DAILY AS NEEDED. FOR ANXIETY 90 tablet 3  . amLODipine-benazepril (LOTREL) 10-20 MG capsule TAKE 1 CAPSULE BY MOUTH ONCE DAILY 30 capsule 5  . Doxepin HCl (SILENOR) 3 MG TABS Take 1 tablet (3 mg total) by mouth at bedtime as needed. 90 tablet 3  . Omega-3 Fatty Acids (FISH OIL) 1000 MG CPDR Take 1,000 mcg by mouth daily.    Marland Kitchen omeprazole (PRILOSEC) 40 MG capsule TAKE ONE CAPSULE BY MOUTH DAILY 90 capsule 1  . rosuvastatin (CRESTOR) 10 MG tablet Take 1 tablet (10 mg total) by mouth daily. 90 tablet 3  . Tamsulosin HCl (FLOMAX) 0.4 MG CAPS Take 0.4 mg by mouth daily after supper.     . vitamin B-12 (CYANOCOBALAMIN) 1000 MCG tablet Take 1,000 mcg by mouth daily. Reported on 03/12/2015    . Diclofenac Sodium (PENNSAID) 2 % SOLN Place 1 application onto the skin 2 (two) times daily as needed. 8 g 0   No facility-administered medications prior to visit.     ROS Review of Systems  Constitutional: Negative.  Negative for appetite change, diaphoresis, fatigue and unexpected weight change.  HENT: Negative.  Negative for trouble swallowing and voice change.   Eyes: Negative.   Respiratory: Negative.  Negative for cough, choking, chest tightness, shortness of breath and stridor.   Cardiovascular: Positive for chest pain. Negative for palpitations and leg swelling.  Gastrointestinal: Negative.  Negative for abdominal pain,  constipation, diarrhea, nausea and vomiting.  Endocrine: Negative.   Genitourinary: Negative.  Negative for difficulty urinating and dysuria.  Musculoskeletal: Negative.  Negative for arthralgias, back pain, myalgias and neck pain.  Skin: Negative for color change and rash.  Allergic/Immunologic: Negative.   Neurological: Negative.  Negative for dizziness, weakness and numbness.  Hematological: Negative.  Negative for adenopathy. Does not bruise/bleed easily.  Psychiatric/Behavioral: Negative.     Objective:  BP 110/78 (BP Location: Left Arm, Patient Position: Sitting, Cuff Size: Normal)   Pulse (!) 58   Temp 97.8 F (36.6 C) (Oral)   Ht 5' 8.4" (1.737 m)   Wt 162 lb 4 oz (73.6 kg)   SpO2 95%   BMI 24.38 kg/m   BP Readings from Last 3 Encounters:  07/21/16 110/78  04/20/16 (!) 150/88  03/17/16 130/80    Wt Readings from Last 3 Encounters:  07/21/16 162 lb 4 oz (73.6 kg)  04/20/16 166 lb 12.8 oz (75.7 kg)  03/17/16 165 lb (74.8 kg)    Physical Exam  Constitutional: He is oriented to person, place, and time. No distress.  HENT:  Mouth/Throat: Oropharynx is clear and moist. No oropharyngeal exudate.  Eyes: Conjunctivae are normal. Right eye exhibits no discharge. Left eye exhibits no discharge. No scleral icterus.  Neck: Normal range of motion. Neck supple. No JVD present. No thyromegaly present.  Cardiovascular: Normal rate, regular rhythm and intact distal pulses.  Exam reveals no gallop and no friction  rub.   No murmur heard. EKG -  Sinus  Bradycardia  - occasional PAC    # PACs = 1. WITHIN NORMAL LIMITS- no change from prior EKG   Pulmonary/Chest: Effort normal and breath sounds normal. No respiratory distress. He has no wheezes. He has no rales. He exhibits no tenderness.  Abdominal: Soft. Bowel sounds are normal. He exhibits no distension and no mass. There is no tenderness. There is no rebound and no guarding.  Musculoskeletal: Normal range of motion. He exhibits  no edema or tenderness.  Lymphadenopathy:    He has no cervical adenopathy.  Neurological: He is oriented to person, place, and time.  Skin: Skin is warm and dry. No rash noted. He is not diaphoretic. No erythema. No pallor.  Vitals reviewed.   Lab Results  Component Value Date   WBC 5.6 03/17/2016   HGB 16.1 03/17/2016   HCT 47.1 03/17/2016   PLT 220.0 03/17/2016   GLUCOSE 107 (H) 07/23/2016   CHOL 151 07/23/2016   TRIG 36.0 07/23/2016   HDL 37.70 (L) 07/23/2016   LDLDIRECT 144.2 02/07/2013   LDLCALC 107 (H) 07/23/2016   ALT 12 07/23/2016   AST 13 07/23/2016   NA 143 07/23/2016   K 3.7 07/23/2016   CL 106 07/23/2016   CREATININE 0.79 07/23/2016   BUN 23 07/23/2016   CO2 29 07/23/2016   TSH 2.50 03/17/2016   PSA 0.80 02/07/2013   INR 0.97 09/10/2009   HGBA1C 6.1 03/17/2016    Ct Abdomen Pelvis Wo Contrast  Result Date: 07/10/2015 CLINICAL DATA:  Pelvic pain for 1 year.  Bladder wall thickening. EXAM: CT ABDOMEN AND PELVIS WITHOUT CONTRAST TECHNIQUE: Multidetector CT imaging of the abdomen and pelvis was performed following the standard protocol without IV contrast. COMPARISON:  CT abdomen pelvis -01/30/2014 FINDINGS: The lack of intravenous contrast limits the ability to evaluate solid abdominal organs. Lower chest: Limited visualization of the lower thorax demonstrates tortuosity of the descending thoracic aorta. Minimal subsegmental atelectasis within the imaged caudal aspect of the lingula, unchanged since the 2015 examination. No new focal airspace opacities. No pleural effusion. Normal heart size.  No pericardial effusion. Hepatobiliary: Normal hepatic contour. Normal noncontrast appearance of the gallbladder given degree distention. No definite radiopaque gallstones. No ascites. Pancreas: Normal noncontrast appearance of the pancreas. Spleen: Normal noncontrast appearance of the spleen Adrenals/Urinary Tract: Punctate (approximately 3 mm) nonobstructing stone within the  interpolar aspect of the right kidney (image 32, series 3, unchanged since the 2015 examination. No left-sided renal stones. No renal stones are seen along the expected course of either ureter. Partially exophytic lesion arising from the superior pole of the right kidney has increased in size in the interval, currently measuring 2.0 x 2.7 cm, previously, 1.4 x 1.9 cm. This lesion is incompletely characterized on this noncontrast examination. Re- demonstrated markedly abnormal appearance of the urinary bladder. Suspected minimal increase in size of the dominant now approximately 6.1 x 2.7 cm diverticulum arising from the right-sided the urinary bladder, previously, 4.7 x 2.5 cm. Additionally, there has been interval increase in size of dominant diverticulum arising from the superior aspect of the left side of the dome of the urinary bladder, currently measuring 2.4 x 2.1 cm (image 58, series 3), previously, 1.9 x 1.5 cm. Irregular wall thickening involving primarily the posterior cranial aspect of the urinary bladder wall (representative images 61, series 3, coronal image 57, series 601) appears grossly unchanged since the 01/2014 examination. There are 2 punctate opacities lying dependently  within the urinary bladder (image 69, series 3) which are favored to represent bladder stones. Normal appearance of the bilateral adrenal glands. Stomach/Bowel: Ingested enteric contrast extends to the level of the rectum. A large stool burden within the rectal fall. Extensive colonic diverticulosis without evidence of diverticulitis on this noncontrast examination. Small hiatal hernia. Normal noncontrast appearance of the terminal ileum and retrocecal appendix. No pneumoperitoneum, pneumatosis or portal venous gas. Vascular/Lymphatic: Normal caliber of the abdominal aorta. No bulky retroperitoneal, mesenteric, pelvic or inguinal lymphadenopathy on this noncontrast examination. Reproductive: The prostate is enlarged measuring 6.6  x 4.0 cm Other: Post left-sided inguinal hernia repair. Musculoskeletal: No acute or aggressive osseous abnormalities. Moderate multilevel lumbar spine DDD, worse at L4-L5 and L5-S1 with disc space height loss, endplate irregularity and sclerosis. IMPRESSION: 1. Re- demonstrated urinary bladder diverticuli with irregular thickening primarily involving the posterior cranial aspect of the urinary bladder wall, unchanged to minimally progressed since the 01/2014 examination - while potentially secondary to chronic bladder outlet obstruction, an underlying lesion is not excluded on the basis of this examination. If not recently performed, further evaluation with urine cytology and/or cystoscopy could be performed as indicated. 2. Prostatomegaly. 3. Interval increase in size of the now approximately 2.7 cm hypo attenuating lesion arising from the superior pole of the right kidney, previously, 1.9 cm - while potentially representative of increased size of a benign renal cyst, this lesion is incompletely characterized on this noncontrast examination. Further evaluation with renal ultrasound could be performed as clinically indicated. 4. Suspected punctate (approximately 2 mm) urinary bladder stones. 5. Unchanged punctate (approximately 0.3 cm) nonobstructing right-sided renal stone. 6. Extensive colonic diverticulosis without evidence of diverticulitis. 7. Large colonic stool burden within the rectal vault. 8. Small hiatal hernia. Electronically Signed   By: Sandi Mariscal M.D.   On: 07/10/2015 15:40    Assessment & Plan:   Zekiah was seen today for hypertension.  Diagnoses and all orders for this visit:  Essential hypertension- his blood pressure is well-controlled, electrolytes and renal function are normal. -     Comprehensive metabolic panel; Future  Hyperlipidemia with target LDL less than 100- he is achieved his LDL goal is doing well on the statin. -     Comprehensive metabolic panel; Future -     Lipid  panel; Future  Atypical chest pain- his chest pain is not consistent with angina and his EKG is negative for LVH or ischemia. Offered him reassurance. -     EKG 12-Lead   I have discontinued Mr. Buffkin's Diclofenac Sodium. I am also having him maintain his tamsulosin, vitamin B-12, ALPRAZolam, Fish Oil, omeprazole, Doxepin HCl, rosuvastatin, and amLODipine-benazepril.  No orders of the defined types were placed in this encounter.    Follow-up: Return in about 6 months (around 01/20/2017).  Scarlette Calico, MD

## 2016-07-23 ENCOUNTER — Encounter: Payer: Self-pay | Admitting: Internal Medicine

## 2016-07-23 ENCOUNTER — Other Ambulatory Visit (INDEPENDENT_AMBULATORY_CARE_PROVIDER_SITE_OTHER): Payer: PPO

## 2016-07-23 DIAGNOSIS — I10 Essential (primary) hypertension: Secondary | ICD-10-CM | POA: Diagnosis not present

## 2016-07-23 DIAGNOSIS — E785 Hyperlipidemia, unspecified: Secondary | ICD-10-CM

## 2016-07-23 LAB — LIPID PANEL
CHOLESTEROL: 151 mg/dL (ref 0–200)
HDL: 37.7 mg/dL — AB (ref >39.00)
LDL Cholesterol: 107 mg/dL — ABNORMAL HIGH (ref 0–99)
NONHDL: 113.79
TRIGLYCERIDES: 36 mg/dL (ref 0.0–149.0)
Total CHOL/HDL Ratio: 4
VLDL: 7.2 mg/dL (ref 0.0–40.0)

## 2016-07-23 LAB — COMPREHENSIVE METABOLIC PANEL
ALK PHOS: 42 U/L (ref 39–117)
ALT: 12 U/L (ref 0–53)
AST: 13 U/L (ref 0–37)
Albumin: 4.1 g/dL (ref 3.5–5.2)
BILIRUBIN TOTAL: 0.4 mg/dL (ref 0.2–1.2)
BUN: 23 mg/dL (ref 6–23)
CALCIUM: 9 mg/dL (ref 8.4–10.5)
CO2: 29 mEq/L (ref 19–32)
Chloride: 106 mEq/L (ref 96–112)
Creatinine, Ser: 0.79 mg/dL (ref 0.40–1.50)
GFR: 100.82 mL/min (ref >60.00)
GLUCOSE: 107 mg/dL — AB (ref 70–99)
Potassium: 3.7 mEq/L (ref 3.5–5.1)
Sodium: 143 mEq/L (ref 135–145)
TOTAL PROTEIN: 6.3 g/dL (ref 6.0–8.3)

## 2016-07-28 DIAGNOSIS — M7021 Olecranon bursitis, right elbow: Secondary | ICD-10-CM | POA: Diagnosis not present

## 2016-08-16 DIAGNOSIS — M7021 Olecranon bursitis, right elbow: Secondary | ICD-10-CM | POA: Diagnosis not present

## 2016-08-23 DIAGNOSIS — N200 Calculus of kidney: Secondary | ICD-10-CM | POA: Diagnosis not present

## 2016-08-23 DIAGNOSIS — N401 Enlarged prostate with lower urinary tract symptoms: Secondary | ICD-10-CM | POA: Diagnosis not present

## 2016-08-23 DIAGNOSIS — N35013 Post-traumatic anterior urethral stricture: Secondary | ICD-10-CM | POA: Diagnosis not present

## 2016-08-23 DIAGNOSIS — N281 Cyst of kidney, acquired: Secondary | ICD-10-CM | POA: Diagnosis not present

## 2016-09-01 ENCOUNTER — Other Ambulatory Visit: Payer: Self-pay | Admitting: Internal Medicine

## 2016-09-01 DIAGNOSIS — F411 Generalized anxiety disorder: Secondary | ICD-10-CM

## 2016-09-01 DIAGNOSIS — K2100 Gastro-esophageal reflux disease with esophagitis, without bleeding: Secondary | ICD-10-CM

## 2016-09-27 ENCOUNTER — Other Ambulatory Visit: Payer: Self-pay | Admitting: Internal Medicine

## 2016-09-27 DIAGNOSIS — F411 Generalized anxiety disorder: Secondary | ICD-10-CM

## 2016-09-28 ENCOUNTER — Other Ambulatory Visit: Payer: Self-pay | Admitting: Internal Medicine

## 2016-09-28 DIAGNOSIS — F411 Generalized anxiety disorder: Secondary | ICD-10-CM

## 2016-09-28 NOTE — Telephone Encounter (Addendum)
Faxed to [facility].  

## 2016-10-14 DIAGNOSIS — L304 Erythema intertrigo: Secondary | ICD-10-CM | POA: Diagnosis not present

## 2016-10-16 DIAGNOSIS — L853 Xerosis cutis: Secondary | ICD-10-CM | POA: Diagnosis not present

## 2016-10-16 DIAGNOSIS — L300 Nummular dermatitis: Secondary | ICD-10-CM | POA: Diagnosis not present

## 2016-10-16 DIAGNOSIS — L299 Pruritus, unspecified: Secondary | ICD-10-CM | POA: Diagnosis not present

## 2016-12-10 ENCOUNTER — Other Ambulatory Visit: Payer: Self-pay | Admitting: Internal Medicine

## 2016-12-24 ENCOUNTER — Other Ambulatory Visit: Payer: Self-pay | Admitting: Internal Medicine

## 2017-02-08 DIAGNOSIS — M71572 Other bursitis, not elsewhere classified, left ankle and foot: Secondary | ICD-10-CM | POA: Diagnosis not present

## 2017-02-08 DIAGNOSIS — M21612 Bunion of left foot: Secondary | ICD-10-CM | POA: Diagnosis not present

## 2017-02-24 DIAGNOSIS — L723 Sebaceous cyst: Secondary | ICD-10-CM | POA: Diagnosis not present

## 2017-02-24 DIAGNOSIS — L821 Other seborrheic keratosis: Secondary | ICD-10-CM | POA: Diagnosis not present

## 2017-02-24 DIAGNOSIS — L308 Other specified dermatitis: Secondary | ICD-10-CM | POA: Diagnosis not present

## 2017-02-24 DIAGNOSIS — Z85828 Personal history of other malignant neoplasm of skin: Secondary | ICD-10-CM | POA: Diagnosis not present

## 2017-02-24 DIAGNOSIS — D1801 Hemangioma of skin and subcutaneous tissue: Secondary | ICD-10-CM | POA: Diagnosis not present

## 2017-02-28 DIAGNOSIS — N35012 Post-traumatic membranous urethral stricture: Secondary | ICD-10-CM | POA: Diagnosis not present

## 2017-02-28 DIAGNOSIS — N400 Enlarged prostate without lower urinary tract symptoms: Secondary | ICD-10-CM | POA: Diagnosis not present

## 2017-02-28 DIAGNOSIS — N323 Diverticulum of bladder: Secondary | ICD-10-CM | POA: Diagnosis not present

## 2017-02-28 DIAGNOSIS — N200 Calculus of kidney: Secondary | ICD-10-CM | POA: Diagnosis not present

## 2017-02-28 DIAGNOSIS — N281 Cyst of kidney, acquired: Secondary | ICD-10-CM | POA: Diagnosis not present

## 2017-02-28 DIAGNOSIS — N3289 Other specified disorders of bladder: Secondary | ICD-10-CM | POA: Diagnosis not present

## 2017-03-02 ENCOUNTER — Other Ambulatory Visit: Payer: Self-pay | Admitting: Internal Medicine

## 2017-03-02 DIAGNOSIS — K2100 Gastro-esophageal reflux disease with esophagitis, without bleeding: Secondary | ICD-10-CM

## 2017-03-04 NOTE — Progress Notes (Addendum)
Subjective:   Mark Jordan is a 77 y.o. male who presents for Medicare Annual/Subsequent preventive examination.  Review of Systems:  No ROS.  Medicare Wellness Visit. Additional risk factors are reflected in the social history.  Cardiac Risk Factors include: advanced age (>65men, >28 women);dyslipidemia;hypertension;male gender Sleep patterns: feels rested on waking, gets up 1-2 times nightly to void and sleeps 7 hours nightly.   Home Safety/Smoke Alarms: Feels safe in home. Smoke alarms in place.  Living environment; residence and Firearm Safety: 2-story house, no firearms. Lives alone, no needs for DME, good support system Seat Belt Safety/Bike Helmet: Wears seat belt.   PSA-  Lab Results  Component Value Date   PSA 0.80 02/07/2013   PSA 1.03 06/19/2012   PSA 0.95 07/21/2011       Objective:    Vitals: BP 138/68   Pulse 64   Resp 18   Ht 5\' 8"  (1.727 m)   Wt 160 lb (72.6 kg)   SpO2 98%   BMI 24.33 kg/m   Body mass index is 24.33 kg/m.  Advanced Directives 03/07/2017 03/02/2016 06/01/2013  Does Patient Have a Medical Advance Directive? Yes Yes Patient has advance directive, copy not in chart;Patient has advance directive, copy in chart  Type of Advance Directive [place];Living will [place];Living will [place];Living will  Copy of [place] in Chart? No - copy requested No - copy requested Copy requested from family    Tobacco Social History   Tobacco Use  Smoking Status Never Smoker  Smokeless Tobacco Never Used     Counseling given: Not Answered   Past Medical History:  Diagnosis Date  . Allergic rhinitis   . Anxiety   . Chronic low back pain   . Diverticulosis of colon   . GERD (gastroesophageal reflux disease)   . HTN (hypertension)   . Hypercholesterolemia   . Kidney calculus   . PUD (peptic ulcer disease)    Past Surgical History:  Procedure Laterality Date  .  COLONOSCOPY  2014  . INGUINAL HERNIA REPAIR     left  . LEG SURGERY    . ROTATOR CUFF REPAIR  5/08   left ; Dr Onnie Graham  . TOTAL SHOULDER ARTHROPLASTY  8/11   after fall w/ prox humerus fx   Family History  Problem Relation Age of Onset  . Heart failure Father   . Pneumonia Mother   . Diabetes Neg Hx   . Stroke Neg Hx   . Heart attack Neg Hx    Social History   Socioeconomic History  . Marital status: Widowed    Spouse name: Jamian Andujo  . Number of children: 3  . Years of education: Not on file  . Highest education level: Not on file  Social Needs  . Financial resource strain: Not hard at all  . Food insecurity - worry: Never true  . Food insecurity - inability: Never true  . Transportation needs - medical: No  . Transportation needs - non-medical: No  Occupational History  . Occupation: Information systems manager: [employer]  Tobacco Use  . Smoking status: Never Smoker  . Smokeless tobacco: Never Used  Substance and Sexual Activity  . Alcohol use: No  . Drug use: No  . Sexual activity: Yes    Partners: Female  Other Topics Concern  . Not on file  Social History Narrative   Does not exercise   1  cup coffee a day   4 siblings all in good health       Outpatient Encounter Medications as of 03/07/2017  Medication Sig  . ALPRAZolam (XANAX) 0.5 MG tablet TAKE 1/2 TO 1 TABLET BY MOUTH 3 TIMES DAILY AS NEEDED FOR ANXIETY.  Marland Kitchen amLODipine-benazepril (LOTREL) 10-20 MG capsule Take 1 capsule by mouth daily.  . Doxepin HCl (SILENOR) 3 MG TABS Take 1 tablet (3 mg total) by mouth at bedtime as needed.  . gabapentin (NEURONTIN) 300 MG capsule TAKE ONE CAPSULE BY MOUTH AT BEDTIME  . Omega-3 Fatty Acids (FISH OIL) 1000 MG CPDR Take 1,000 mcg by mouth daily.  Marland Kitchen omeprazole (PRILOSEC) 40 MG capsule TAKE ONE (1) CAPSULE EACH DAY  . rosuvastatin (CRESTOR) 10 MG tablet Take 1 tablet (10 mg total) by mouth daily.  . Tamsulosin HCl (FLOMAX) 0.4 MG CAPS Take 0.4 mg by mouth daily after  supper.   . vitamin B-12 (CYANOCOBALAMIN) 1000 MCG tablet Take 1,000 mcg by mouth daily. Reported on 03/12/2015   No facility-administered encounter medications on file as of 03/07/2017.     Activities of Daily Living In your present state of health, do you have any difficulty performing the following activities: 03/07/2017  Hearing? N  Vision? N  Difficulty concentrating or making decisions? N  Walking or climbing stairs? N  Dressing or bathing? N  Doing errands, shopping? N  Preparing Food and eating ? N  Using the Toilet? N  In the past six months, have you accidently leaked urine? N  Do you have problems with loss of bowel control? N  Managing your Medications? N  Managing your Finances? N  Housekeeping or managing your Housekeeping? N  Some recent data might be hidden    Patient Care Team: Janith Lima, MD as PCP - General (Internal Medicine) Richmond Campbell, MD as Consulting Physician (Gastroenterology) Kristopher Oppenheim (Dentistry) Myrlene Broker, MD (Urology) Pa, [place] (Podiatry)   Assessment:   This is a routine wellness examination for Mark Jordan. Physical assessment deferred to PCP.   Exercise Activities and Dietary recommendations Current Exercise Habits: The patient does not participate in regular exercise at present, Exercise limited by: None identified Diet (meal preparation, eat out, water intake, caffeinated beverages, dairy products, fruits and vegetables): in general, a "healthy" diet  , well balanced     Reviewed heart healthy and diabetic diet, encouraged patient to increase daily water intake. Diet education was attached to patient's AVS.  Goals    . Patient Stated     Increase physical activity by walking more and riding my bike. Monitor sugar, carbohydrates, and salt in my diet. Enjoy life, family and church.       Fall Risk Fall Risk  03/07/2017 03/02/2016 01/22/2014 01/14/2014 06/01/2013  Falls in the past year? No No No No No  Risk for fall  due to : - - - - Impaired vision;Medication side effect    Depression Screen PHQ 2/9 Scores 03/07/2017 03/02/2016 01/22/2014 06/19/2012  PHQ - 2 Score 0 0 0 0  PHQ- 9 Score 0 - - -    Cognitive Function MMSE - Mini Mental State Exam 03/07/2017  Orientation to time 5  Orientation to Place 5  Registration 3  Attention/ Calculation 5  Recall 2  Language- name 2 objects 2  Language- repeat 1  Language- follow 3 step command 3  Language- read & follow direction 1  Write a sentence 1  Copy design 1  Total  score 29        Immunization History  Administered Date(s) Administered  . Influenza Split 10/18/2011  . Influenza Whole 12/05/2009  . Influenza, High Dose Seasonal PF 03/02/2016  . Influenza,inj,Quad PF,6+ Mos 11/01/2014  . Pneumococcal Conjugate-13 03/12/2015  . Pneumococcal Polysaccharide-23 04/03/2001, 03/31/2009  . Td 04/03/2001  . Tdap 07/21/2011   Screening Tests Health Maintenance  Topic Date Due  . INFLUENZA VACCINE  09/01/2016  . TETANUS/TDAP  07/20/2021  . PNA vac Low Risk Adult  Completed      Plan:     Patient will ask to receive flu shot 03/21/17, not given today due to patient's son has been sick and patient has had cold-like symptoms for the last few days.   Continue doing brain stimulating activities (puzzles, reading, adult coloring books, staying active) to keep memory sharp.   Continue to eat heart healthy diet (full of fruits, vegetables, whole grains, lean protein, water--limit salt, fat, and sugar intake) and increase physical activity as tolerated.   I have personally reviewed and noted the following in the patient's chart:   . Medical and social history . Use of alcohol, tobacco or illicit drugs  . Current medications and supplements . Functional ability and status . Nutritional status . Physical activity . Advanced directives . List of other physicians . Vitals . Screenings to include cognitive, depression, and falls . Referrals and  appointments  In addition, I have reviewed and discussed with patient certain preventive protocols, quality metrics, and best practice recommendations. A written personalized care plan for preventive services as well as general preventive health recommendations were provided to patient.   Medical screening examination/treatment/procedure(s) were performed by non-physician practitioner and as supervising physician I was immediately available for consultation/collaboration. I agree with above. Scarlette Calico, MD   Michiel [place], RN  03/07/2017

## 2017-03-07 ENCOUNTER — Ambulatory Visit (INDEPENDENT_AMBULATORY_CARE_PROVIDER_SITE_OTHER): Payer: Medicare HMO | Admitting: *Deleted

## 2017-03-07 VITALS — BP 138/68 | HR 64 | Resp 18 | Ht 68.0 in | Wt 160.0 lb

## 2017-03-07 DIAGNOSIS — Z0000 Encounter for general adult medical examination without abnormal findings: Secondary | ICD-10-CM | POA: Diagnosis not present

## 2017-03-07 NOTE — Patient Instructions (Addendum)
Continue doing brain stimulating activities (puzzles, reading, adult coloring books, staying active) to keep memory sharp.   Continue to eat heart healthy diet (full of fruits, vegetables, whole grains, lean protein, water--limit salt, fat, and sugar intake) and increase physical activity as tolerated.   Mr. Mark Jordan , Thank you for taking time to come for your Medicare Wellness Visit. I appreciate your ongoing commitment to your health goals. Please review the following plan we discussed and let me know if I can assist you in the future.   These are the goals we discussed: Goals    . Patient Stated     Increase physical activity by walking more and riding my bike. Monitor sugar, carbohydrates, and salt in my diet. Enjoy life, family and church.       This is a list of the screening recommended for you and due dates:  Health Maintenance  Topic Date Due  . Flu Shot  09/01/2016  . Tetanus Vaccine  07/20/2021  . Pneumonia vaccines  Completed    DASH Eating Plan DASH stands for "Dietary Approaches to Stop Hypertension." The DASH eating plan is a healthy eating plan that has been shown to reduce high blood pressure (hypertension). It may also reduce your risk for type 2 diabetes, heart disease, and stroke. The DASH eating plan may also help with weight loss. What are tips for following this plan? General guidelines  Avoid eating more than 2,300 mg (milligrams) of salt (sodium) a day. If you have hypertension, you may need to reduce your sodium intake to 1,500 mg a day.  Limit alcohol intake to no more than 1 drink a day for nonpregnant women and 2 drinks a day for men. One drink equals 12 oz of beer, 5 oz of wine, or 1 oz of hard liquor.  Work with your health care provider to maintain a healthy body weight or to lose weight. Ask what an ideal weight is for you.  Get at least 30 minutes of exercise that causes your heart to beat faster (aerobic exercise) most days of the week. Activities  may include walking, swimming, or biking.  Work with your health care provider or diet and nutrition specialist (dietitian) to adjust your eating plan to your individual calorie needs. Reading food labels  Check food labels for the amount of sodium per serving. Choose foods with less than 5 percent of the Daily Value of sodium. Generally, foods with less than 300 mg of sodium per serving fit into this eating plan.  To find whole grains, look for the word "whole" as the first word in the ingredient list. Shopping  Buy products labeled as "low-sodium" or "no salt added."  Buy fresh foods. Avoid canned foods and premade or frozen meals. Cooking  Avoid adding salt when cooking. Use salt-free seasonings or herbs instead of table salt or sea salt. Check with your health care provider or pharmacist before using salt substitutes.  Do not fry foods. Cook foods using healthy methods such as baking, boiling, grilling, and broiling instead.  Cook with heart-healthy oils, such as olive, canola, soybean, or sunflower oil. Meal planning   Eat a balanced diet that includes: ? 5 or more servings of fruits and vegetables each day. At each meal, try to fill half of your plate with fruits and vegetables. ? Up to 6-8 servings of whole grains each day. ? Less than 6 oz of lean meat, poultry, or fish each day. A 3-oz serving of meat is about the  same size as a deck of cards. One egg equals 1 oz. ? 2 servings of low-fat dairy each day. ? A serving of nuts, seeds, or beans 5 times each week. ? Heart-healthy fats. Healthy fats called Omega-3 fatty acids are found in foods such as flaxseeds and coldwater fish, like sardines, salmon, and mackerel.  Limit how much you eat of the following: ? Canned or prepackaged foods. ? Food that is high in trans fat, such as fried foods. ? Food that is high in saturated fat, such as fatty meat. ? Sweets, desserts, sugary drinks, and other foods with added sugar. ? Full-fat  dairy products.  Do not salt foods before eating.  Try to eat at least 2 vegetarian meals each week.  Eat more home-cooked food and less restaurant, buffet, and fast food.  When eating at a restaurant, ask that your food be prepared with less salt or no salt, if possible. What foods are recommended? The items listed may not be a complete list. Talk with your dietitian about what dietary choices are best for you. Grains Whole-grain or whole-wheat bread. Whole-grain or whole-wheat pasta. Brown rice. [place]. Bulgur. Whole-grain and low-sodium cereals. Pita bread. Low-fat, low-sodium crackers. Whole-wheat flour tortillas. Vegetables Fresh or frozen vegetables (raw, steamed, roasted, or grilled). Low-sodium or reduced-sodium tomato and vegetable juice. Low-sodium or reduced-sodium tomato sauce and tomato paste. Low-sodium or reduced-sodium canned vegetables. Fruits All fresh, dried, or frozen fruit. Canned fruit in natural juice (without added sugar). Meat and other protein foods Skinless chicken or [place]. Ground chicken or [place]. Pork with fat trimmed off. Fish and seafood. Egg whites. Dried beans, peas, or lentils. Unsalted nuts, nut butters, and seeds. Unsalted canned beans. Lean cuts of beef with fat trimmed off. Low-sodium, lean deli meat. Dairy Low-fat (1%) or fat-free (skim) milk. Fat-free, low-fat, or reduced-fat cheeses. Nonfat, low-sodium ricotta or cottage cheese. Low-fat or nonfat yogurt. Low-fat, low-sodium cheese. Fats and oils Soft margarine without trans fats. Vegetable oil. Low-fat, reduced-fat, or light mayonnaise and salad dressings (reduced-sodium). Canola, safflower, olive, soybean, and sunflower oils. Avocado. Seasoning and other foods Herbs. Spices. Seasoning mixes without salt. Unsalted popcorn and pretzels. Fat-free sweets. What foods are not recommended? The items listed may not be a complete list. Talk with your dietitian about what dietary choices are best  for you. Grains Baked goods made with fat, such as croissants, muffins, or some breads. Dry pasta or rice meal packs. Vegetables Creamed or fried vegetables. Vegetables in a cheese sauce. Regular canned vegetables (not low-sodium or reduced-sodium). Regular canned tomato sauce and paste (not low-sodium or reduced-sodium). Regular tomato and vegetable juice (not low-sodium or reduced-sodium). Angie Fava. Olives. Fruits Canned fruit in a light or heavy syrup. Fried fruit. Fruit in cream or butter sauce. Meat and other protein foods Fatty cuts of meat. Ribs. Fried meat. Berniece Salines. Sausage. Bologna and other processed lunch meats. Salami. Fatback. Hotdogs. Bratwurst. Salted nuts and seeds. Canned beans with added salt. Canned or smoked fish. Whole eggs or egg yolks. Chicken or [place] with skin. Dairy Whole or 2% milk, cream, and half-and-half. Whole or full-fat cream cheese. Whole-fat or sweetened yogurt. Full-fat cheese. Nondairy creamers. Whipped toppings. Processed cheese and cheese spreads. Fats and oils Butter. Stick margarine. Lard. Shortening. Ghee. Bacon fat. Tropical oils, such as coconut, palm kernel, or palm oil. Seasoning and other foods Salted popcorn and pretzels. Onion salt, garlic salt, seasoned salt, table salt, and sea salt. Worcestershire sauce. Tartar sauce. Barbecue sauce. Teriyaki sauce. Soy sauce, including  reduced-sodium. Steak sauce. Canned and packaged gravies. Fish sauce. Oyster sauce. Cocktail sauce. Horseradish that you find on the shelf. Ketchup. Mustard. Meat flavorings and tenderizers. Bouillon cubes. Hot sauce and Tabasco sauce. Premade or packaged marinades. Premade or packaged taco seasonings. Relishes. Regular salad dressings. Where to find more information:  National Heart, Lung, and [place]: https://wilson-eaton.com/  American Heart Association: www.heart.org Summary  The DASH eating plan is a healthy eating plan that has been shown to reduce high blood pressure  (hypertension). It may also reduce your risk for type 2 diabetes, heart disease, and stroke.  With the DASH eating plan, you should limit salt (sodium) intake to 2,300 mg a day. If you have hypertension, you may need to reduce your sodium intake to 1,500 mg a day.  When on the DASH eating plan, aim to eat more fresh fruits and vegetables, whole grains, lean proteins, low-fat dairy, and heart-healthy fats.  Work with your health care provider or diet and nutrition specialist (dietitian) to adjust your eating plan to your individual calorie needs. This information is not intended to replace advice given to you by your health care provider. Make sure you discuss any questions you have with your health care provider. Document Released: 01/07/2011 Document Revised: 01/12/2016 Document Reviewed: 01/12/2016 Elsevier Interactive Patient Education  2018 [employer].  Carbohydrate Counting for Diabetes Mellitus, Adult Carbohydrate counting is a method for keeping track of how many carbohydrates you eat. Eating carbohydrates naturally increases the amount of sugar (glucose) in the blood. Counting how many carbohydrates you eat helps keep your blood glucose within normal limits, which helps you manage your diabetes (diabetes mellitus). It is important to know how many carbohydrates you can safely have in each meal. This is different for every person. A diet and nutrition specialist (registered dietitian) can help you make a meal plan and calculate how many carbohydrates you should have at each meal and snack. Carbohydrates are found in the following foods:  Grains, such as breads and cereals.  Dried beans and soy products.  Starchy vegetables, such as potatoes, peas, and corn.  Fruit and fruit juices.  Milk and yogurt.  Sweets and snack foods, such as cake, cookies, candy, chips, and soft drinks.  How do I count carbohydrates? There are two ways to count carbohydrates in food. You can use either  of the methods or a combination of both. Reading "Nutrition Facts" on packaged food The "Nutrition Facts" list is included on the labels of almost all packaged foods and beverages in the [place] It includes:  The serving size.  Information about nutrients in each serving, including the grams (g) of carbohydrate per serving.  To use the "Nutrition Facts":  Decide how many servings you will have.  Multiply the number of servings by the number of carbohydrates per serving.  The resulting number is the total amount of carbohydrates that you will be having.  Learning standard serving sizes of other foods When you eat foods containing carbohydrates that are not packaged or do not include "Nutrition Facts" on the label, you need to measure the servings in order to count the amount of carbohydrates:  Measure the foods that you will eat with a food scale or measuring cup, if needed.  Decide how many standard-size servings you will eat.  Multiply the number of servings by 15. Most carbohydrate-rich foods have about 15 g of carbohydrates per serving. ? For example, if you eat 8 oz (170 g) of strawberries, you will have eaten  2 servings and 30 g of carbohydrates (2 servings x 15 g = 30 g).  For foods that have more than one food mixed, such as soups and casseroles, you must count the carbohydrates in each food that is included.  The following list contains standard serving sizes of common carbohydrate-rich foods. Each of these servings has about 15 g of carbohydrates:   hamburger bun or  English muffin.   oz (15 mL) syrup.   oz (14 g) jelly.  1 slice of bread.  1 six-inch tortilla.  3 oz (85 g) cooked rice or pasta.  4 oz (113 g) cooked dried beans.  4 oz (113 g) starchy vegetable, such as peas, corn, or potatoes.  4 oz (113 g) hot cereal.  4 oz (113 g) mashed potatoes or  of a large baked potato.  4 oz (113 g) canned or frozen fruit.  4 oz (120 mL) fruit juice.  4-6  crackers.  6 chicken nuggets.  6 oz (170 g) unsweetened dry cereal.  6 oz (170 g) plain fat-free yogurt or yogurt sweetened with artificial sweeteners.  8 oz (240 mL) milk.  8 oz (170 g) fresh fruit or one small piece of fruit.  24 oz (680 g) popped popcorn.  Example of carbohydrate counting Sample meal  3 oz (85 g) chicken breast.  6 oz (170 g) brown rice.  4 oz (113 g) corn.  8 oz (240 mL) milk.  8 oz (170 g) strawberries with sugar-free whipped topping. Carbohydrate calculation 1. Identify the foods that contain carbohydrates: ? Rice. ? Corn. ? Milk. ? Strawberries. 2. Calculate how many servings you have of each food: ? 2 servings rice. ? 1 serving corn. ? 1 serving milk. ? 1 serving strawberries. 3. Multiply each number of servings by 15 g: ? 2 servings rice x 15 g = 30 g. ? 1 serving corn x 15 g = 15 g. ? 1 serving milk x 15 g = 15 g. ? 1 serving strawberries x 15 g = 15 g. 4. Add together all of the amounts to find the total grams of carbohydrates eaten: ? 30 g + 15 g + 15 g + 15 g = 75 g of carbohydrates total. This information is not intended to replace advice given to you by your health care provider. Make sure you discuss any questions you have with your health care provider. Document Released: 01/18/2005 Document Revised: 08/08/2015 Document Reviewed: 07/02/2015 Elsevier Interactive Patient Education  2018 [employer].  Diabetes Mellitus and Nutrition When you have diabetes (diabetes mellitus), it is very important to have healthy eating habits because your blood sugar (glucose) levels are greatly affected by what you eat and drink. Eating healthy foods in the appropriate amounts, at about the same times every day, can help you:  Control your blood glucose.  Lower your risk of heart disease.  Improve your blood pressure.  Reach or maintain a healthy weight.  Every person with diabetes is different, and each person has different needs for a meal  plan. Your health care provider may recommend that you work with a diet and nutrition specialist (dietitian) to make a meal plan that is best for you. Your meal plan may vary depending on factors such as:  The calories you need.  The medicines you take.  Your weight.  Your blood glucose, blood pressure, and cholesterol levels.  Your activity level.  Other health conditions you have, such as heart or kidney disease.  How do  carbohydrates affect me? Carbohydrates affect your blood glucose level more than any other type of food. Eating carbohydrates naturally increases the amount of glucose in your blood. Carbohydrate counting is a method for keeping track of how many carbohydrates you eat. Counting carbohydrates is important to keep your blood glucose at a healthy level, especially if you use insulin or take certain oral diabetes medicines. It is important to know how many carbohydrates you can safely have in each meal. This is different for every person. Your dietitian can help you calculate how many carbohydrates you should have at each meal and for snack. Foods that contain carbohydrates include:  Bread, cereal, rice, pasta, and crackers.  Potatoes and corn.  Peas, beans, and lentils.  Milk and yogurt.  Fruit and juice.  Desserts, such as cakes, cookies, ice cream, and candy.  How does alcohol affect me? Alcohol can cause a sudden decrease in blood glucose (hypoglycemia), especially if you use insulin or take certain oral diabetes medicines. Hypoglycemia can be a life-threatening condition. Symptoms of hypoglycemia (sleepiness, dizziness, and confusion) are similar to symptoms of having too much alcohol. If your health care provider says that alcohol is safe for you, follow these guidelines:  Limit alcohol intake to no more than 1 drink per day for nonpregnant women and 2 drinks per day for men. One drink equals 12 oz of beer, 5 oz of wine, or 1 oz of hard liquor.  Do not drink  on an empty stomach.  Keep yourself hydrated with water, diet soda, or unsweetened iced tea.  Keep in mind that regular soda, juice, and other mixers may contain a lot of sugar and must be counted as carbohydrates.  What are tips for following this plan? Reading food labels  Start by checking the serving size on the label. The amount of calories, carbohydrates, fats, and other nutrients listed on the label are based on one serving of the food. Many foods contain more than one serving per package.  Check the total grams (g) of carbohydrates in one serving. You can calculate the number of servings of carbohydrates in one serving by dividing the total carbohydrates by 15. For example, if a food has 30 g of total carbohydrates, it would be equal to 2 servings of carbohydrates.  Check the number of grams (g) of saturated and trans fats in one serving. Choose foods that have low or no amount of these fats.  Check the number of milligrams (mg) of sodium in one serving. Most people should limit total sodium intake to less than 2,300 mg per day.  Always check the nutrition information of foods labeled as "low-fat" or "nonfat". These foods may be higher in added sugar or refined carbohydrates and should be avoided.  Talk to your dietitian to identify your daily goals for nutrients listed on the label. Shopping  Avoid buying canned, premade, or processed foods. These foods tend to be high in fat, sodium, and added sugar.  Shop around the outside edge of the grocery store. This includes fresh fruits and vegetables, bulk grains, fresh meats, and fresh dairy. Cooking  Use low-heat cooking methods, such as baking, instead of high-heat cooking methods like deep frying.  Cook using healthy oils, such as olive, canola, or sunflower oil.  Avoid cooking with butter, cream, or high-fat meats. Meal planning  Eat meals and snacks regularly, preferably at the same times every day. Avoid going long periods  of time without eating.  Eat foods high in fiber, such  as fresh fruits, vegetables, beans, and whole grains. Talk to your dietitian about how many servings of carbohydrates you can eat at each meal.  Eat 4-6 ounces of lean protein each day, such as lean meat, chicken, fish, eggs, or tofu. 1 ounce is equal to 1 ounce of meat, chicken, or fish, 1 egg, or 1/4 cup of tofu.  Eat some foods each day that contain healthy fats, such as avocado, nuts, seeds, and fish. Lifestyle   Check your blood glucose regularly.  Exercise at least 30 minutes 5 or more days each week, or as told by your health care provider.  Take medicines as told by your health care provider.  Do not use any products that contain nicotine or tobacco, such as cigarettes and e-cigarettes. If you need help quitting, ask your health care provider.  Work with a Social worker or diabetes educator to identify strategies to manage stress and any emotional and social challenges. What are some questions to ask my health care provider?  Do I need to meet with a diabetes educator?  Do I need to meet with a dietitian?  What number can I call if I have questions?  When are the best times to check my blood glucose? Where to find more information:  American Diabetes Association: diabetes.org/food-and-fitness/food  Academy of Nutrition and Dietetics: PokerClues.dk  [employer] of Diabetes and Digestive and Kidney Diseases (NIH): ContactWire.be Summary  A healthy meal plan will help you control your blood glucose and maintain a healthy lifestyle.  Working with a diet and nutrition specialist (dietitian) can help you make a meal plan that is best for you.  Keep in mind that carbohydrates and alcohol have immediate effects on your blood glucose levels. It is important to count carbohydrates and to use alcohol  carefully. This information is not intended to replace advice given to you by your health care provider. Make sure you discuss any questions you have with your health care provider. Document Released: 10/15/2004 Document Revised: 02/23/2016 Document Reviewed: 02/23/2016 Elsevier Interactive Patient Education  [employer].

## 2017-03-18 ENCOUNTER — Encounter: Payer: PPO | Admitting: Internal Medicine

## 2017-03-21 ENCOUNTER — Other Ambulatory Visit (INDEPENDENT_AMBULATORY_CARE_PROVIDER_SITE_OTHER): Payer: Medicare HMO

## 2017-03-21 ENCOUNTER — Ambulatory Visit (INDEPENDENT_AMBULATORY_CARE_PROVIDER_SITE_OTHER): Payer: Medicare HMO | Admitting: Internal Medicine

## 2017-03-21 ENCOUNTER — Encounter: Payer: Self-pay | Admitting: Internal Medicine

## 2017-03-21 VITALS — BP 130/66 | HR 57 | Temp 97.6°F | Resp 16 | Ht 68.0 in | Wt 163.0 lb

## 2017-03-21 DIAGNOSIS — N401 Enlarged prostate with lower urinary tract symptoms: Principal | ICD-10-CM

## 2017-03-21 DIAGNOSIS — Z0000 Encounter for general adult medical examination without abnormal findings: Secondary | ICD-10-CM | POA: Diagnosis not present

## 2017-03-21 DIAGNOSIS — R739 Hyperglycemia, unspecified: Secondary | ICD-10-CM

## 2017-03-21 DIAGNOSIS — Z23 Encounter for immunization: Secondary | ICD-10-CM

## 2017-03-21 DIAGNOSIS — N138 Other obstructive and reflux uropathy: Secondary | ICD-10-CM

## 2017-03-21 DIAGNOSIS — I10 Essential (primary) hypertension: Secondary | ICD-10-CM | POA: Diagnosis not present

## 2017-03-21 LAB — CBC WITH DIFFERENTIAL/PLATELET
BASOS ABS: 0 10*3/uL (ref 0.0–0.1)
BASOS PCT: 0.6 % (ref 0.0–3.0)
EOS ABS: 0.1 10*3/uL (ref 0.0–0.7)
Eosinophils Relative: 1.2 % (ref 0.0–5.0)
HCT: 46.9 % (ref 39.0–52.0)
HEMOGLOBIN: 15.7 g/dL (ref 13.0–17.0)
Lymphocytes Relative: 29.6 % (ref 12.0–46.0)
Lymphs Abs: 1.6 10*3/uL (ref 0.7–4.0)
MCHC: 33.5 g/dL (ref 30.0–36.0)
MCV: 92.9 fl (ref 78.0–100.0)
Monocytes Absolute: 0.4 10*3/uL (ref 0.1–1.0)
Monocytes Relative: 7.6 % (ref 3.0–12.0)
Neutro Abs: 3.3 10*3/uL (ref 1.4–7.7)
Neutrophils Relative %: 61 % (ref 43.0–77.0)
Platelets: 188 10*3/uL (ref 150.0–400.0)
RBC: 5.05 Mil/uL (ref 4.22–5.81)
RDW: 13.1 % (ref 11.5–15.5)
WBC: 5.3 10*3/uL (ref 4.0–10.5)

## 2017-03-21 LAB — BASIC METABOLIC PANEL
BUN: 25 mg/dL — ABNORMAL HIGH (ref 6–23)
CHLORIDE: 106 meq/L (ref 96–112)
CO2: 30 mEq/L (ref 19–32)
Calcium: 9.1 mg/dL (ref 8.4–10.5)
Creatinine, Ser: 0.79 mg/dL (ref 0.40–1.50)
GFR: 100.65 mL/min (ref >60.00)
GLUCOSE: 106 mg/dL — AB (ref 70–99)
POTASSIUM: 4.4 meq/L (ref 3.5–5.1)
SODIUM: 141 meq/L (ref 135–145)

## 2017-03-21 LAB — HEMOGLOBIN A1C: Hgb A1c MFr Bld: 6.1 % (ref 4.6–6.5)

## 2017-03-21 NOTE — Progress Notes (Signed)
Subjective:  Patient ID: Mark Jordan, male    DOB: 01-13-1941  Age: 77 y.o. MRN: [id number]  CC: Hypertension and Annual Exam   HPI Dragan Pao Haffey presents for a CPX.  He tells me his blood pressure has been well controlled.  He has a rare sensation of feeling woozy but did not dizziness, lightheadedness, or near syncope.  He is very active and denies CP, DOE, palpitations, edema, or fatigue. He recently saw his urologist and is very upset that a PSA lab test was not ordered.  He wants that to be checked today.  Past Medical History:  Diagnosis Date  . Allergic rhinitis   . Anxiety   . Chronic low back pain   . Diverticulosis of colon   . GERD (gastroesophageal reflux disease)   . HTN (hypertension)   . Hypercholesterolemia   . Kidney calculus   . PUD (peptic ulcer disease)    Past Surgical History:  Procedure Laterality Date  . COLONOSCOPY  2014  . INGUINAL HERNIA REPAIR     left  . LEG SURGERY    . ROTATOR CUFF REPAIR  5/08   left ; Dr Onnie Graham  . TOTAL SHOULDER ARTHROPLASTY  8/11   after fall w/ prox humerus fx    reports that  has never smoked. he has never used smokeless tobacco. He reports that he does not drink alcohol or use drugs. family history includes Heart failure in his father; Pneumonia in his mother. No Known Allergies  Outpatient Medications Prior to Visit  Medication Sig Dispense Refill  . ALPRAZolam (XANAX) 0.5 MG tablet TAKE 1/2 TO 1 TABLET BY MOUTH 3 TIMES DAILY AS NEEDED FOR ANXIETY. 90 tablet 2  . amLODipine-benazepril (LOTREL) 10-20 MG capsule Take 1 capsule by mouth daily. 90 capsule 0  . Doxepin HCl (SILENOR) 3 MG TABS Take 1 tablet (3 mg total) by mouth at bedtime as needed. 90 tablet 3  . gabapentin (NEURONTIN) 300 MG capsule TAKE ONE CAPSULE BY MOUTH AT BEDTIME 30 capsule 3  . Omega-3 Fatty Acids (FISH OIL) 1000 MG CPDR Take 1,000 mcg by mouth daily.    Marland Kitchen omeprazole (PRILOSEC) 40 MG capsule TAKE ONE (1) CAPSULE EACH DAY 90 capsule 1  .  rosuvastatin (CRESTOR) 10 MG tablet Take 1 tablet (10 mg total) by mouth daily. 90 tablet 3  . Tamsulosin HCl (FLOMAX) 0.4 MG CAPS Take 0.4 mg by mouth daily after supper.     . triamcinolone cream (KENALOG) 0.1 %     . vitamin B-12 (CYANOCOBALAMIN) 1000 MCG tablet Take 1,000 mcg by mouth daily. Reported on 03/12/2015     No facility-administered medications prior to visit.     ROS Review of Systems  Constitutional: Negative.  Negative for diaphoresis and fatigue.  HENT: Negative.   Eyes: Negative for visual disturbance.  Respiratory: Negative for cough, chest tightness, shortness of breath and wheezing.   Cardiovascular: Negative for chest pain, palpitations and leg swelling.  Gastrointestinal: Negative for abdominal pain, constipation, diarrhea, nausea and vomiting.  Endocrine: Negative.   Genitourinary: Negative for difficulty urinating.  Musculoskeletal: Negative.  Negative for arthralgias and myalgias.  Skin: Negative.   Allergic/Immunologic: Negative.   Neurological: Positive for dizziness. Negative for weakness, light-headedness and numbness.  Hematological: Negative for adenopathy. Does not bruise/bleed easily.  Psychiatric/Behavioral: Positive for sleep disturbance. Negative for decreased concentration, self-injury and suicidal ideas. The patient is not nervous/anxious.     Objective:  BP 130/66 (BP Location: Left Arm, Patient Position:  Sitting, Cuff Size: Normal)   Pulse (!) 57   Temp 97.6 F (36.4 C) (Oral)   Resp 16   Ht 5\' 8"  (1.727 m)   Wt 163 lb (73.9 kg)   SpO2 98%   BMI 24.78 kg/m   BP Readings from Last 3 Encounters:  03/21/17 130/66  03/07/17 138/68  07/21/16 110/78    Wt Readings from Last 3 Encounters:  03/21/17 163 lb (73.9 kg)  03/07/17 160 lb (72.6 kg)  07/21/16 162 lb 4 oz (73.6 kg)    Physical Exam  Constitutional: He is oriented to person, place, and time. No distress.  HENT:  Mouth/Throat: Oropharynx is clear and moist. No oropharyngeal  exudate.  Eyes: Conjunctivae are normal. Left eye exhibits no discharge. No scleral icterus.  Neck: Normal range of motion. Neck supple. No JVD present. No thyromegaly present.  Cardiovascular: Normal rate, regular rhythm and normal heart sounds. Exam reveals no gallop.  No murmur heard. EKG---  Sinus  Rhythm  -Old anterior infarct   possible septal Q-waves.   -  Nonspecific T-abnormality.   ABNORMAL - no change from the prior EKG  Pulmonary/Chest: Effort normal and breath sounds normal. No respiratory distress. He has no wheezes. He has no rales.  Abdominal: Soft. Bowel sounds are normal. He exhibits no distension and no mass. There is no tenderness.  Musculoskeletal: Normal range of motion. He exhibits no edema, tenderness or deformity.  Lymphadenopathy:    He has no cervical adenopathy.  Neurological: He is alert and oriented to person, place, and time.  Skin: Skin is warm and dry. No rash noted. He is not diaphoretic. No erythema. No pallor.  Psychiatric: He has a normal mood and affect. His behavior is normal. Judgment and thought content normal.  Vitals reviewed.   Lab Results  Component Value Date   WBC 5.3 03/21/2017   HGB 15.7 03/21/2017   HCT 46.9 03/21/2017   PLT 188.0 03/21/2017   GLUCOSE 106 (H) 03/21/2017   CHOL 151 07/23/2016   TRIG 36.0 07/23/2016   HDL 37.70 (L) 07/23/2016   LDLDIRECT 144.2 02/07/2013   LDLCALC 107 (H) 07/23/2016   ALT 12 07/23/2016   AST 13 07/23/2016   NA 141 03/21/2017   K 4.4 03/21/2017   CL 106 03/21/2017   CREATININE 0.79 03/21/2017   BUN 25 (H) 03/21/2017   CO2 30 03/21/2017   TSH 2.50 03/17/2016   PSA 1.3 03/22/2017   INR 0.97 09/10/2009   HGBA1C 6.1 03/21/2017    Ct Abdomen Pelvis Wo Contrast  Result Date: 07/10/2015 CLINICAL DATA:  Pelvic pain for 1 year.  Bladder wall thickening. EXAM: CT ABDOMEN AND PELVIS WITHOUT CONTRAST TECHNIQUE: Multidetector CT imaging of the abdomen and pelvis was performed following the standard  protocol without IV contrast. COMPARISON:  CT abdomen pelvis -01/30/2014 FINDINGS: The lack of intravenous contrast limits the ability to evaluate solid abdominal organs. Lower chest: Limited visualization of the lower thorax demonstrates tortuosity of the descending thoracic aorta. Minimal subsegmental atelectasis within the imaged caudal aspect of the lingula, unchanged since the 2015 examination. No new focal airspace opacities. No pleural effusion. Normal heart size.  No pericardial effusion. Hepatobiliary: Normal hepatic contour. Normal noncontrast appearance of the gallbladder given degree distention. No definite radiopaque gallstones. No ascites. Pancreas: Normal noncontrast appearance of the pancreas. Spleen: Normal noncontrast appearance of the spleen Adrenals/Urinary Tract: Punctate (approximately 3 mm) nonobstructing stone within the interpolar aspect of the right kidney (image 32, series 3, unchanged since the  2015 examination. No left-sided renal stones. No renal stones are seen along the expected course of either ureter. Partially exophytic lesion arising from the superior pole of the right kidney has increased in size in the interval, currently measuring 2.0 x 2.7 cm, previously, 1.4 x 1.9 cm. This lesion is incompletely characterized on this noncontrast examination. Re- demonstrated markedly abnormal appearance of the urinary bladder. Suspected minimal increase in size of the dominant now approximately 6.1 x 2.7 cm diverticulum arising from the right-sided the urinary bladder, previously, 4.7 x 2.5 cm. Additionally, there has been interval increase in size of dominant diverticulum arising from the superior aspect of the left side of the dome of the urinary bladder, currently measuring 2.4 x 2.1 cm (image 58, series 3), previously, 1.9 x 1.5 cm. Irregular wall thickening involving primarily the posterior cranial aspect of the urinary bladder wall (representative images 61, series 3, coronal image 57,  series 601) appears grossly unchanged since the 01/2014 examination. There are 2 punctate opacities lying dependently within the urinary bladder (image 69, series 3) which are favored to represent bladder stones. Normal appearance of the bilateral adrenal glands. Stomach/Bowel: Ingested enteric contrast extends to the level of the rectum. A large stool burden within the rectal fall. Extensive colonic diverticulosis without evidence of diverticulitis on this noncontrast examination. Small hiatal hernia. Normal noncontrast appearance of the terminal ileum and retrocecal appendix. No pneumoperitoneum, pneumatosis or portal venous gas. Vascular/Lymphatic: Normal caliber of the abdominal aorta. No bulky retroperitoneal, mesenteric, pelvic or inguinal lymphadenopathy on this noncontrast examination. Reproductive: The prostate is enlarged measuring 6.6 x 4.0 cm Other: Post left-sided inguinal hernia repair. Musculoskeletal: No acute or aggressive osseous abnormalities. Moderate multilevel lumbar spine DDD, worse at L4-L5 and L5-S1 with disc space height loss, endplate irregularity and sclerosis. IMPRESSION: 1. Re- demonstrated urinary bladder diverticuli with irregular thickening primarily involving the posterior cranial aspect of the urinary bladder wall, unchanged to minimally progressed since the 01/2014 examination - while potentially secondary to chronic bladder outlet obstruction, an underlying lesion is not excluded on the basis of this examination. If not recently performed, further evaluation with urine cytology and/or cystoscopy could be performed as indicated. 2. Prostatomegaly. 3. Interval increase in size of the now approximately 2.7 cm hypo attenuating lesion arising from the superior pole of the right kidney, previously, 1.9 cm - while potentially representative of increased size of a benign renal cyst, this lesion is incompletely characterized on this noncontrast examination. Further evaluation with renal  ultrasound could be performed as clinically indicated. 4. Suspected punctate (approximately 2 mm) urinary bladder stones. 5. Unchanged punctate (approximately 0.3 cm) nonobstructing right-sided renal stone. 6. Extensive colonic diverticulosis without evidence of diverticulitis. 7. Large colonic stool burden within the rectal vault. 8. Small hiatal hernia. Electronically Signed   By: Sandi Mariscal M.D.   On: 07/10/2015 15:40    Assessment & Plan:   Cruzito was seen today for hypertension and annual exam.  Diagnoses and all orders for this visit:  Essential hypertension- His blood pressure is well controlled.  He has mild dizziness but his EKG is negative for any changes suspicious for ischemia or LVH.  His electrolytes and renal function are normal.  Will continue the combination of an ACE inhibitor and calcium channel blocker. -     EKG 12-Lead -     CBC with Differential/Platelet; Future -     Basic metabolic panel; Future  Hyperglycemia- His A1c is at 6.1%.  He has prediabetes.  Medical therapy  is not indicated. -     Basic metabolic panel; Future -     Hemoglobin A1c; Future  BPH with obstruction/lower urinary tract symptoms- His PSA is low so I am not concerned about prostate cancer.  His symptoms are well controlled with tamsulosin. -     PSA, total and free; Future  Need for influenza vaccination -     Flu vaccine HIGH DOSE PF (Fluzone High dose)  Routine general medical examination at a health care facility   I am having Trixie Deis maintain his tamsulosin, vitamin B-12, Fish Oil, Doxepin HCl, rosuvastatin, ALPRAZolam, gabapentin, amLODipine-benazepril, omeprazole, and triamcinolone cream.  No orders of the defined types were placed in this encounter.  See AVS for instructions about healthy living and anticipatory guidance.  Follow-up: Return in about 3 months (around 06/18/2017).  Scarlette Calico, MD

## 2017-03-21 NOTE — Patient Instructions (Signed)

## 2017-03-22 ENCOUNTER — Encounter: Payer: Self-pay | Admitting: Internal Medicine

## 2017-03-22 LAB — PSA, TOTAL AND FREE
PSA, % Free: 38 % (calc) (ref >25)
PSA, Free: 0.5 ng/mL
PSA, Total: 1.3 ng/mL (ref <=4.0)

## 2017-03-22 LAB — PSA: PSA: 1.3

## 2017-03-24 DIAGNOSIS — Z23 Encounter for immunization: Secondary | ICD-10-CM | POA: Insufficient documentation

## 2017-03-24 DIAGNOSIS — Z0000 Encounter for general adult medical examination without abnormal findings: Secondary | ICD-10-CM | POA: Insufficient documentation

## 2017-03-24 NOTE — Assessment & Plan Note (Signed)

## 2017-04-05 ENCOUNTER — Other Ambulatory Visit: Payer: Self-pay | Admitting: Internal Medicine

## 2017-04-11 ENCOUNTER — Telehealth: Payer: Self-pay | Admitting: Family Medicine

## 2017-04-11 ENCOUNTER — Encounter: Payer: Self-pay | Admitting: Family Medicine

## 2017-04-11 ENCOUNTER — Other Ambulatory Visit (INDEPENDENT_AMBULATORY_CARE_PROVIDER_SITE_OTHER): Payer: Medicare HMO

## 2017-04-11 ENCOUNTER — Ambulatory Visit (INDEPENDENT_AMBULATORY_CARE_PROVIDER_SITE_OTHER): Payer: Medicare HMO | Admitting: Family Medicine

## 2017-04-11 VITALS — BP 140/90 | HR 62 | Wt 161.0 lb

## 2017-04-11 DIAGNOSIS — E785 Hyperlipidemia, unspecified: Secondary | ICD-10-CM | POA: Diagnosis not present

## 2017-04-11 DIAGNOSIS — R109 Unspecified abdominal pain: Secondary | ICD-10-CM | POA: Diagnosis not present

## 2017-04-11 LAB — LIPID PANEL
CHOL/HDL RATIO: 3
Cholesterol: 149 mg/dL (ref 0–200)
HDL: 43.3 mg/dL (ref >39.00)
LDL CALC: 99 mg/dL (ref 0–99)
NONHDL: 106.06
TRIGLYCERIDES: 35 mg/dL (ref 0.0–149.0)
VLDL: 7 mg/dL (ref 0.0–40.0)

## 2017-04-11 LAB — COMPREHENSIVE METABOLIC PANEL
ALT: 14 U/L (ref 0–53)
AST: 13 U/L (ref 0–37)
Albumin: 4.4 g/dL (ref 3.5–5.2)
Alkaline Phosphatase: 45 U/L (ref 39–117)
BUN: 20 mg/dL (ref 6–23)
CALCIUM: 9.8 mg/dL (ref 8.4–10.5)
CO2: 30 meq/L (ref 19–32)
CREATININE: 0.72 mg/dL (ref 0.40–1.50)
Chloride: 104 mEq/L (ref 96–112)
GFR: 112.01 mL/min (ref >60.00)
GLUCOSE: 112 mg/dL — AB (ref 70–99)
Potassium: 4.3 mEq/L (ref 3.5–5.1)
Sodium: 140 mEq/L (ref 135–145)
Total Bilirubin: 0.6 mg/dL (ref 0.2–1.2)
Total Protein: 7.1 g/dL (ref 6.0–8.3)

## 2017-04-11 MED ORDER — COLCHICINE 0.6 MG PO TABS: 0.6000 mg | ORAL_TABLET | Freq: Two times a day (BID) | ORAL | 2 refills | Status: DC

## 2017-04-11 NOTE — Patient Instructions (Addendum)
We will call you with the results from today.  

## 2017-04-11 NOTE — Assessment & Plan Note (Signed)
Seems MSK in nature.  - counseled on supportive care  - f/u PRN

## 2017-04-11 NOTE — Assessment & Plan Note (Signed)
Would like this labs check today  - CMP and Lipid panel.

## 2017-04-11 NOTE — Telephone Encounter (Signed)
Left VM for patient. If he calls back please have him speak with a nurse/CMA and inform this cholesterol and liver blood tests looked normal.   If any questions then please take the best time and phone number to call and I will try to call him back.   Rosemarie Ax, MD [facility] 04/11/2017, 5:28 PM

## 2017-04-11 NOTE — Progress Notes (Signed)
Mark Jordan - 77 y.o. male MRN [id number]  Date of birth: 04-07-1940  SUBJECTIVE:  Including CC & ROS.  Chief Complaint  Patient presents with  . Flank Pain    startede a couple weeks ago     Mark Jordan is a 77 y.o. male that is  Presenting with left flank pain. This is been acute in nature. It is very mild and intermittent. He denies any rash. He does have a history of shingles and this does not feel anything similar to that. He denies any changes in his bowel movements. Has not had take any medications for this. He would like to have his liver enzymes and cholesterol check today area he has concern that this may be associated with those values..   Review of Systems  Constitutional: Negative for fever.  HENT: Negative for congestion.   Respiratory: Negative for cough.   Cardiovascular: Negative for leg swelling.  Gastrointestinal: Positive for abdominal pain.  Musculoskeletal: Negative for gait problem.  Skin: Negative for color change.    HISTORY: Past Medical, Surgical, Social, and Family History Reviewed & Updated per EMR.   Pertinent Historical Findings include:  Past Medical History:  Diagnosis Date  . Allergic rhinitis   . Anxiety   . Chronic low back pain   . Diverticulosis of colon   . GERD (gastroesophageal reflux disease)   . HTN (hypertension)   . Hypercholesterolemia   . Kidney calculus   . PUD (peptic ulcer disease)     Past Surgical History:  Procedure Laterality Date  . COLONOSCOPY  2014  . INGUINAL HERNIA REPAIR     left  . LEG SURGERY    . ROTATOR CUFF REPAIR  5/08   left ; Dr Onnie Graham  . TOTAL SHOULDER ARTHROPLASTY  8/11   after fall w/ prox humerus fx    No Known Allergies  Family History  Problem Relation Age of Onset  . Heart failure Father   . Pneumonia Mother   . Diabetes Neg Hx   . Stroke Neg Hx   . Heart attack Neg Hx      Social History   Socioeconomic History  . Marital status: Widowed    Spouse name: Arie Powell  .  Number of children: 3  . Years of education: Not on file  . Highest education level: Not on file  Social Needs  . Financial resource strain: Not hard at all  . Food insecurity - worry: Never true  . Food insecurity - inability: Never true  . Transportation needs - medical: No  . Transportation needs - non-medical: No  Occupational History  . Occupation: Information systems manager: [employer]  Tobacco Use  . Smoking status: Never Smoker  . Smokeless tobacco: Never Used  Substance and Sexual Activity  . Alcohol use: No  . Drug use: No  . Sexual activity: Yes    Partners: Female  Other Topics Concern  . Not on file  Social History Narrative   Does not exercise   1 cup coffee a day   4 siblings all in good health        PHYSICAL EXAM:  VS: BP 140/90 (BP Location: Right Arm, Patient Position: Sitting, Cuff Size: Normal)   Pulse 62   Wt 161 lb (73 kg)   BMI 24.48 kg/m  Physical Exam Gen: NAD, alert, cooperative with exam, well-appearing ENT: normal lips, normal nasal mucosa,  Eye: normal EOM, normal conjunctiva and lids CV:  no edema, +2 pedal pulses   Resp: no accessory muscle use, non-labored,  GI: no masses or tenderness, no hernia, soft, nondistended   Skin: no rashes, no areas of induration  Neuro: normal tone, normal sensation to touch Psych:  normal insight, alert and oriented MSK: normal gait, normal strength     ASSESSMENT & PLAN:   Flank pain Seems MSK in nature.  - counseled on supportive care  - f/u PRN   Hyperlipidemia with target LDL less than 100 Would like this labs check today  - CMP and Lipid panel.

## 2017-04-12 NOTE — Telephone Encounter (Signed)
Pt returning call about labs his best number is his cell he would like a call back today he is in a meeting at 3 today but its fine to call him at [phone]

## 2017-04-12 NOTE — Telephone Encounter (Signed)
Informed of his lab results.   Rosemarie Ax, MD [facility] 04/12/2017, 5:07 PM

## 2017-04-12 NOTE — Telephone Encounter (Signed)
Pt states the best time for him is around 1:30pm and to try and call his cell for results.

## 2017-05-24 ENCOUNTER — Other Ambulatory Visit: Payer: Self-pay | Admitting: Internal Medicine

## 2017-05-24 DIAGNOSIS — F411 Generalized anxiety disorder: Secondary | ICD-10-CM

## 2017-06-02 DIAGNOSIS — J206 Acute bronchitis due to rhinovirus: Secondary | ICD-10-CM | POA: Diagnosis not present

## 2017-06-02 DIAGNOSIS — R05 Cough: Secondary | ICD-10-CM | POA: Diagnosis not present

## 2017-06-02 DIAGNOSIS — J309 Allergic rhinitis, unspecified: Secondary | ICD-10-CM | POA: Diagnosis not present

## 2017-07-15 DIAGNOSIS — R197 Diarrhea, unspecified: Secondary | ICD-10-CM | POA: Diagnosis not present

## 2017-07-15 DIAGNOSIS — K219 Gastro-esophageal reflux disease without esophagitis: Secondary | ICD-10-CM | POA: Diagnosis not present

## 2017-07-15 DIAGNOSIS — Z86010 Personal history of colonic polyps: Secondary | ICD-10-CM | POA: Insufficient documentation

## 2017-07-15 DIAGNOSIS — K921 Melena: Secondary | ICD-10-CM | POA: Diagnosis not present

## 2017-07-27 ENCOUNTER — Encounter: Payer: Self-pay | Admitting: Sports Medicine

## 2017-07-27 ENCOUNTER — Ambulatory Visit: Payer: Medicare HMO | Admitting: Sports Medicine

## 2017-07-27 ENCOUNTER — Ambulatory Visit (INDEPENDENT_AMBULATORY_CARE_PROVIDER_SITE_OTHER): Payer: Medicare HMO

## 2017-07-27 ENCOUNTER — Other Ambulatory Visit: Payer: Self-pay | Admitting: Sports Medicine

## 2017-07-27 VITALS — BP 131/76 | HR 58 | Temp 97.2°F

## 2017-07-27 DIAGNOSIS — L84 Corns and callosities: Secondary | ICD-10-CM

## 2017-07-27 DIAGNOSIS — M79675 Pain in left toe(s): Secondary | ICD-10-CM

## 2017-07-27 DIAGNOSIS — M779 Enthesopathy, unspecified: Secondary | ICD-10-CM

## 2017-07-27 DIAGNOSIS — M2042 Other hammer toe(s) (acquired), left foot: Secondary | ICD-10-CM

## 2017-07-27 DIAGNOSIS — M7752 Other enthesopathy of left foot: Secondary | ICD-10-CM

## 2017-07-27 NOTE — Progress Notes (Signed)
   Subjective:    Patient ID: Mark Jordan, male    DOB: [birth year], 77 y.o.   MRN: [id number]  HPI    Review of Systems  Musculoskeletal: Positive for joint swelling.  All other systems reviewed and are negative.      Objective:   Physical Exam        Assessment & Plan:

## 2017-07-27 NOTE — Progress Notes (Signed)
Subjective: Mark Jordan is a 77 y.o. male patient who presents to office for evaluation of Left foot pain secondary to callus skin at the 3rd toe. Patient complains of pain at the lesion present at Left 3rd toe that has gotten worse over the last month. Patient has tried seeing other doctors who trimmed it and the pain came back after 3 months and has tried corn pad/otc remover with no relief in symptoms. Patient denies any other pedal complaints. Admits past surgery to toe. Pain worse when in shoe.  Review of Systems  Musculoskeletal: Positive for joint pain.  All other systems reviewed and are negative.    Patient Active Problem List   Diagnosis Date Noted  . Flank pain 04/11/2017  . Need for influenza vaccination 03/24/2017  . Routine general medical examination at a health care facility 03/24/2017  . Arthralgia of right acromioclavicular joint 04/20/2016  . Lumbar radicular pain 10/08/2015  . Cervical radiculitis 10/08/2015  . GAD (generalized anxiety disorder) 07/03/2015  . Hyperglycemia 11/01/2014  . Bladder wall thickening 02/01/2014  . Insomnia, persistent 10/29/2013  . 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 DJD (degenerative joint disease) 02/21/2012  . Hyperlipidemia with target LDL less than 100 11/22/2006  . Essential hypertension 11/22/2006  . ALLERGIC RHINITIS 11/22/2006  . GERD 11/22/2006    Current Outpatient Medications on File Prior to Visit  Medication Sig Dispense Refill  . ALPRAZolam (XANAX) 0.5 MG tablet TAKE 1/2 TO 1 TABLET BY MOUTH 3 TIMES DAILY AS NEEDED FOR ANXIETY 90 tablet 2  . amLODipine-benazepril (LOTREL) 10-20 MG capsule TAKE ONE CAPSULE BY MOUTH DAILY 90 capsule 1  . Doxepin HCl (SILENOR) 3 MG TABS Take 1 tablet (3 mg total) by mouth at bedtime as needed. (Patient not taking: Reported on 04/11/2017) 90 tablet 3  . gabapentin (NEURONTIN) 300 MG capsule TAKE ONE CAPSULE BY MOUTH AT  BEDTIME 30 capsule 3  . Omega-3 Fatty Acids (FISH OIL) 1000 MG CPDR Take 1,000 mcg by mouth daily.    Marland Kitchen omeprazole (PRILOSEC) 40 MG capsule TAKE ONE (1) CAPSULE EACH DAY 90 capsule 1  . rosuvastatin (CRESTOR) 10 MG tablet Take 1 tablet (10 mg total) by mouth daily. 90 tablet 3  . Tamsulosin HCl (FLOMAX) 0.4 MG CAPS Take 0.4 mg by mouth daily after supper.     . triamcinolone cream (KENALOG) 0.1 %     . vitamin B-12 (CYANOCOBALAMIN) 1000 MCG tablet Take 1,000 mcg by mouth daily. Reported on 03/12/2015     No current facility-administered medications on file prior to visit.     No Known Allergies  Objective:  General: Alert and oriented x3 in no acute distress  Dermatology: Keratotic lesion present lateral 3rd toe on left with skin lines transversing the lesion, pain is present with direct pressure to the lesion with a central nucleated core noted, no webspace macerations, no ecchymosis bilateral, surgical scars well healed, all nails x 10 are thick but well manicured.  Vascular: Dorsalis Pedis and Posterior Tibial pedal pulses 1/4, Capillary Fill Time 3 seconds, + pedal hair growth bilateral, no edema bilateral lower extremities, Temperature gradient within normal limits.  Neurology: Johney Maine sensation intact via light touch bilateral.  Musculoskeletal: Mild tenderness with palpation at the keratotic lesion site on left 3rd toe with crossover hammertoe deformity, Muscular strength 4/5 in all groups without pain or limitation on range of motion. + Bunion and hammertoe lower extremity muscular or boney deformity  noted.  Xray, Left foot Mildly decreased osseous mineralization, there is significant diffuse arthritis bunion and hammertoe deformity.  No other acute findings.  Assessment and Plan: Problem List Items Addressed This Visit    None    Visit Diagnoses    Corns and callosities    -  Primary   Relevant Orders   DG Foot Complete Left   Capsulitis of toe of left foot       Hammertoe of  left foot       Toe pain, left          -Complete examination performed X-rays reviewed -Discussed treatment options for painful corn at left third toe with hammertoe deformity -After oral consent and aseptic prep, injected a mixture containing 1 ml of 2%  plain lidocaine, 1 ml 0.5% plain marcaine at the inflamed corn area at the left third toe and then mechanically debrided using a sterile chisel blade the corn. Patient tolerated the procedure and debridement of corn without complication -Dispensed toe cushion -Advised good supportive shoes and inserts -Patient to return to office in 1 month for possible re-discussion of surgical consult discussed with patient possible correction of hammertoe versus amputation of toe.  Landis Martins, DPM

## 2017-08-25 ENCOUNTER — Ambulatory Visit: Payer: Medicare HMO | Admitting: Sports Medicine

## 2017-09-07 ENCOUNTER — Other Ambulatory Visit: Payer: Self-pay | Admitting: Internal Medicine

## 2017-09-07 DIAGNOSIS — K2100 Gastro-esophageal reflux disease with esophagitis, without bleeding: Secondary | ICD-10-CM

## 2017-09-12 DIAGNOSIS — N35012 Post-traumatic membranous urethral stricture: Secondary | ICD-10-CM | POA: Diagnosis not present

## 2017-09-12 DIAGNOSIS — Z09 Encounter for follow-up examination after completed treatment for conditions other than malignant neoplasm: Secondary | ICD-10-CM | POA: Diagnosis not present

## 2017-09-12 DIAGNOSIS — N400 Enlarged prostate without lower urinary tract symptoms: Secondary | ICD-10-CM | POA: Diagnosis not present

## 2017-09-12 DIAGNOSIS — Z87442 Personal history of urinary calculi: Secondary | ICD-10-CM | POA: Diagnosis not present

## 2017-09-12 DIAGNOSIS — N200 Calculus of kidney: Secondary | ICD-10-CM | POA: Diagnosis not present

## 2017-09-12 DIAGNOSIS — N323 Diverticulum of bladder: Secondary | ICD-10-CM | POA: Diagnosis not present

## 2017-09-12 DIAGNOSIS — N281 Cyst of kidney, acquired: Secondary | ICD-10-CM | POA: Diagnosis not present

## 2017-09-20 DIAGNOSIS — L578 Other skin changes due to chronic exposure to nonionizing radiation: Secondary | ICD-10-CM | POA: Diagnosis not present

## 2017-09-20 DIAGNOSIS — D1801 Hemangioma of skin and subcutaneous tissue: Secondary | ICD-10-CM | POA: Diagnosis not present

## 2017-09-20 DIAGNOSIS — L821 Other seborrheic keratosis: Secondary | ICD-10-CM | POA: Diagnosis not present

## 2017-10-11 ENCOUNTER — Other Ambulatory Visit: Payer: Self-pay | Admitting: Internal Medicine

## 2017-10-20 ENCOUNTER — Other Ambulatory Visit: Payer: Self-pay | Admitting: Internal Medicine

## 2017-11-29 DIAGNOSIS — L02212 Cutaneous abscess of back [any part, except buttock]: Secondary | ICD-10-CM | POA: Diagnosis not present

## 2017-11-29 DIAGNOSIS — L0390 Cellulitis, unspecified: Secondary | ICD-10-CM | POA: Diagnosis not present

## 2017-12-08 ENCOUNTER — Other Ambulatory Visit: Payer: Self-pay | Admitting: Internal Medicine

## 2017-12-08 DIAGNOSIS — F411 Generalized anxiety disorder: Secondary | ICD-10-CM

## 2018-02-28 DIAGNOSIS — H2513 Age-related nuclear cataract, bilateral: Secondary | ICD-10-CM | POA: Diagnosis not present

## 2018-03-02 DIAGNOSIS — H90A31 Mixed conductive and sensorineural hearing loss, unilateral, right ear with restricted hearing on the contralateral side: Secondary | ICD-10-CM | POA: Diagnosis not present

## 2018-03-02 DIAGNOSIS — H6121 Impacted cerumen, right ear: Secondary | ICD-10-CM | POA: Diagnosis not present

## 2018-03-16 DIAGNOSIS — H608X1 Other otitis externa, right ear: Secondary | ICD-10-CM | POA: Diagnosis not present

## 2018-03-16 DIAGNOSIS — H6123 Impacted cerumen, bilateral: Secondary | ICD-10-CM | POA: Diagnosis not present

## 2018-03-20 ENCOUNTER — Other Ambulatory Visit: Payer: Self-pay | Admitting: Internal Medicine

## 2018-03-20 DIAGNOSIS — K2100 Gastro-esophageal reflux disease with esophagitis, without bleeding: Secondary | ICD-10-CM

## 2018-03-31 DIAGNOSIS — H6122 Impacted cerumen, left ear: Secondary | ICD-10-CM | POA: Diagnosis not present

## 2018-05-03 ENCOUNTER — Encounter: Payer: Medicare HMO | Admitting: Internal Medicine

## 2018-05-25 ENCOUNTER — Other Ambulatory Visit: Payer: Self-pay | Admitting: Internal Medicine

## 2018-05-25 DIAGNOSIS — F411 Generalized anxiety disorder: Secondary | ICD-10-CM

## 2018-05-30 ENCOUNTER — Other Ambulatory Visit: Payer: Self-pay

## 2018-05-30 ENCOUNTER — Encounter: Payer: Self-pay | Admitting: Internal Medicine

## 2018-05-30 ENCOUNTER — Ambulatory Visit (INDEPENDENT_AMBULATORY_CARE_PROVIDER_SITE_OTHER): Payer: Medicare HMO | Admitting: Internal Medicine

## 2018-05-30 ENCOUNTER — Other Ambulatory Visit (INDEPENDENT_AMBULATORY_CARE_PROVIDER_SITE_OTHER): Payer: Medicare HMO

## 2018-05-30 VITALS — BP 128/72 | HR 58 | Temp 98.0°F | Resp 16 | Ht 68.0 in | Wt 153.0 lb

## 2018-05-30 DIAGNOSIS — E785 Hyperlipidemia, unspecified: Secondary | ICD-10-CM | POA: Diagnosis not present

## 2018-05-30 DIAGNOSIS — R109 Unspecified abdominal pain: Secondary | ICD-10-CM | POA: Diagnosis not present

## 2018-05-30 DIAGNOSIS — Z0000 Encounter for general adult medical examination without abnormal findings: Secondary | ICD-10-CM | POA: Diagnosis not present

## 2018-05-30 DIAGNOSIS — R7303 Prediabetes: Secondary | ICD-10-CM | POA: Diagnosis not present

## 2018-05-30 DIAGNOSIS — N401 Enlarged prostate with lower urinary tract symptoms: Secondary | ICD-10-CM | POA: Diagnosis not present

## 2018-05-30 DIAGNOSIS — R3129 Other microscopic hematuria: Secondary | ICD-10-CM | POA: Insufficient documentation

## 2018-05-30 DIAGNOSIS — I10 Essential (primary) hypertension: Secondary | ICD-10-CM | POA: Diagnosis not present

## 2018-05-30 DIAGNOSIS — N138 Other obstructive and reflux uropathy: Secondary | ICD-10-CM | POA: Diagnosis not present

## 2018-05-30 DIAGNOSIS — D696 Thrombocytopenia, unspecified: Secondary | ICD-10-CM | POA: Diagnosis not present

## 2018-05-30 DIAGNOSIS — G8929 Other chronic pain: Secondary | ICD-10-CM | POA: Insufficient documentation

## 2018-05-30 LAB — URINALYSIS, ROUTINE W REFLEX MICROSCOPIC
Bilirubin Urine: NEGATIVE
Ketones, ur: NEGATIVE
Leukocytes,Ua: NEGATIVE
Nitrite: NEGATIVE
Specific Gravity, Urine: 1.015 (ref 1.000–1.030)
Total Protein, Urine: NEGATIVE
Urine Glucose: NEGATIVE
Urobilinogen, UA: 0.2 (ref 0.0–1.0)
pH: 7 (ref 5.0–8.0)

## 2018-05-30 LAB — LIPID PANEL
Cholesterol: 186 mg/dL (ref 0–200)
HDL: 37.5 mg/dL — ABNORMAL LOW (ref >39.00)
LDL Cholesterol: 139 mg/dL — ABNORMAL HIGH (ref 0–99)
NonHDL: 148.55
Total CHOL/HDL Ratio: 5
Triglycerides: 48 mg/dL (ref 0.0–149.0)
VLDL: 9.6 mg/dL (ref 0.0–40.0)

## 2018-05-30 LAB — CBC WITH DIFFERENTIAL/PLATELET
Basophils Absolute: 0 10*3/uL (ref 0.0–0.1)
Basophils Relative: 0.6 % (ref 0.0–3.0)
Eosinophils Absolute: 0.1 10*3/uL (ref 0.0–0.7)
Eosinophils Relative: 1.2 % (ref 0.0–5.0)
HCT: 43.2 % (ref 39.0–52.0)
Hemoglobin: 14.5 g/dL (ref 13.0–17.0)
Lymphocytes Relative: 33.4 % (ref 12.0–46.0)
Lymphs Abs: 1.5 10*3/uL (ref 0.7–4.0)
MCHC: 33.7 g/dL (ref 30.0–36.0)
MCV: 91.8 fl (ref 78.0–100.0)
Monocytes Absolute: 0.3 10*3/uL (ref 0.1–1.0)
Monocytes Relative: 7 % (ref 3.0–12.0)
Neutro Abs: 2.7 10*3/uL (ref 1.4–7.7)
Neutrophils Relative %: 57.8 % (ref 43.0–77.0)
Platelets: 173 10*3/uL (ref 150.0–400.0)
RBC: 4.7 Mil/uL (ref 4.22–5.81)
RDW: 14.3 % (ref 11.5–15.5)
WBC: 4.6 10*3/uL (ref 4.0–10.5)

## 2018-05-30 LAB — HEMOGLOBIN A1C: Hgb A1c MFr Bld: 6.1 % (ref 4.6–6.5)

## 2018-05-30 LAB — COMPREHENSIVE METABOLIC PANEL
ALT: 14 U/L (ref 0–53)
AST: 15 U/L (ref 0–37)
Albumin: 4.3 g/dL (ref 3.5–5.2)
Alkaline Phosphatase: 51 U/L (ref 39–117)
BUN: 18 mg/dL (ref 6–23)
CO2: 29 mEq/L (ref 19–32)
Calcium: 8.7 mg/dL (ref 8.4–10.5)
Chloride: 105 mEq/L (ref 96–112)
Creatinine, Ser: 0.66 mg/dL (ref 0.40–1.50)
GFR: 116.18 mL/min (ref >60.00)
Glucose, Bld: 94 mg/dL (ref 70–99)
Potassium: 4.1 mEq/L (ref 3.5–5.1)
Sodium: 141 mEq/L (ref 135–145)
Total Bilirubin: 0.8 mg/dL (ref 0.2–1.2)
Total Protein: 6.6 g/dL (ref 6.0–8.3)

## 2018-05-30 LAB — TSH: TSH: 2.2 u[IU]/mL (ref 0.35–4.50)

## 2018-05-30 LAB — PSA: PSA: 0.97 ng/mL (ref 0.10–4.00)

## 2018-05-30 NOTE — Assessment & Plan Note (Signed)

## 2018-05-30 NOTE — Patient Instructions (Signed)

## 2018-05-30 NOTE — Progress Notes (Signed)
Subjective:  Patient ID: Mark Jordan, male    DOB: 02-08-1940  Age: 78 y.o. MRN: [id number]  CC: Annual Exam and Hypertension   HPI [name] presents for a CPX.  He complains of a 1 year history of intermittent left flank soreness and now he thinks he is losing weight.  He denies nausea, vomiting, dysuria, hematuria, rash, dizziness, or lightheadedness.  Outpatient Medications Prior to Visit  Medication Sig Dispense Refill   ALPRAZolam (XANAX) 0.5 MG tablet TAKE 1/2-1 TABLET BY MOUTH ONCE DAILY ATBEDTIME AS NEEDED ONLY. 30 tablet 2   amLODipine-benazepril (LOTREL) 10-20 MG capsule TAKE 1 CAPSULE BY MOUTH ONCE DAILY 90 capsule 0   Doxepin HCl (SILENOR) 3 MG TABS Take 1 tablet (3 mg total) by mouth at bedtime as needed. 90 tablet 3   gabapentin (NEURONTIN) 300 MG capsule TAKE ONE CAPSULE BY MOUTH AT BEDTIME 30 capsule 3   Omega-3 Fatty Acids (FISH OIL) 1000 MG CPDR Take 1,000 mcg by mouth daily.     omeprazole (PRILOSEC) 40 MG capsule TAKE ONE CAPSULE BY MOUTH DAILY 90 capsule 1   Tamsulosin HCl (FLOMAX) 0.4 MG CAPS Take 0.4 mg by mouth daily after supper.      No facility-administered medications prior to visit.     ROS Review of Systems  Constitutional: Positive for unexpected weight change. Negative for appetite change, diaphoresis and fatigue.  HENT: Negative.  Negative for trouble swallowing.   Eyes: Negative for visual disturbance.  Respiratory: Negative for cough, chest tightness, shortness of breath and wheezing.   Cardiovascular: Negative.  Negative for chest pain, palpitations and leg swelling.  Gastrointestinal: Negative for abdominal pain, constipation, diarrhea, nausea and vomiting.  Genitourinary: Positive for flank pain. Negative for hematuria, penile pain, scrotal swelling, testicular pain and urgency.  Musculoskeletal: Negative for arthralgias.  Skin: Negative.   Neurological: Negative.  Negative for dizziness, weakness, light-headedness and  headaches.  Hematological: Negative for adenopathy. Does not bruise/bleed easily.  Psychiatric/Behavioral: Negative.     Objective:  BP 128/72    Pulse (!) 58    Temp 98 F (36.7 C) (Oral)    Resp 16    Ht 5\' 8"  (1.727 m)    Wt 153 lb (69.4 kg)    SpO2 96%    BMI 23.26 kg/m   BP Readings from Last 3 Encounters:  05/30/18 128/72  07/27/17 131/76  04/11/17 140/90    Wt Readings from Last 3 Encounters:  05/30/18 153 lb (69.4 kg)  04/11/17 161 lb (73 kg)  03/21/17 163 lb (73.9 kg)    Physical Exam Vitals signs reviewed.  Constitutional:      Appearance: He is not ill-appearing or diaphoretic.  HENT:     Nose: Nose normal.     Mouth/Throat:     Pharynx: No oropharyngeal exudate or posterior oropharyngeal erythema.  Eyes:     General: No scleral icterus.    Conjunctiva/sclera: Conjunctivae normal.  Neck:     Musculoskeletal: Normal range of motion and neck supple.  Cardiovascular:     Rate and Rhythm: Normal rate and regular rhythm.     Heart sounds: No murmur. No gallop.   Pulmonary:     Effort: Pulmonary effort is normal. No respiratory distress.     Breath sounds: Normal breath sounds. No wheezing, rhonchi or rales.  Abdominal:     General: Abdomen is flat. Bowel sounds are normal.     Palpations: Abdomen is soft.     Tenderness: There is  no abdominal tenderness. There is no right CVA tenderness or left CVA tenderness.     Hernia: No hernia is present.  Musculoskeletal: Normal range of motion.        General: No swelling.     Right lower leg: No edema.     Left lower leg: No edema.  Lymphadenopathy:     Cervical: No cervical adenopathy.  Skin:    General: Skin is warm and dry.     Coloration: Skin is not pale.     Findings: No rash.  Neurological:     General: No focal deficit present.  Psychiatric:        Mood and Affect: Mood normal.     Lab Results  Component Value Date   WBC 4.6 05/30/2018   HGB 14.5 05/30/2018   HCT 43.2 05/30/2018   PLT 173.0  05/30/2018   GLUCOSE 94 05/30/2018   CHOL 186 05/30/2018   TRIG 48.0 05/30/2018   HDL 37.50 (L) 05/30/2018   LDLDIRECT 144.2 02/07/2013   LDLCALC 139 (H) 05/30/2018   ALT 14 05/30/2018   AST 15 05/30/2018   NA 141 05/30/2018   K 4.1 05/30/2018   CL 105 05/30/2018   CREATININE 0.66 05/30/2018   BUN 18 05/30/2018   CO2 29 05/30/2018   TSH 2.20 05/30/2018   PSA 0.97 05/30/2018   INR 0.97 09/10/2009   HGBA1C 6.1 05/30/2018    Ct Abdomen Pelvis Wo Contrast  Result Date: 07/10/2015 CLINICAL DATA:  Pelvic pain for 1 year.  Bladder wall thickening. EXAM: CT ABDOMEN AND PELVIS WITHOUT CONTRAST TECHNIQUE: Multidetector CT imaging of the abdomen and pelvis was performed following the standard protocol without IV contrast. COMPARISON:  CT abdomen pelvis -01/30/2014 FINDINGS: The lack of intravenous contrast limits the ability to evaluate solid abdominal organs. Lower chest: Limited visualization of the lower thorax demonstrates tortuosity of the descending thoracic aorta. Minimal subsegmental atelectasis within the imaged caudal aspect of the lingula, unchanged since the 2015 examination. No new focal airspace opacities. No pleural effusion. Normal heart size.  No pericardial effusion. Hepatobiliary: Normal hepatic contour. Normal noncontrast appearance of the gallbladder given degree distention. No definite radiopaque gallstones. No ascites. Pancreas: Normal noncontrast appearance of the pancreas. Spleen: Normal noncontrast appearance of the spleen Adrenals/Urinary Tract: Punctate (approximately 3 mm) nonobstructing stone within the interpolar aspect of the right kidney (image 32, series 3, unchanged since the 2015 examination. No left-sided renal stones. No renal stones are seen along the expected course of either ureter. Partially exophytic lesion arising from the superior pole of the right kidney has increased in size in the interval, currently measuring 2.0 x 2.7 cm, previously, 1.4 x 1.9 cm. This  lesion is incompletely characterized on this noncontrast examination. Re- demonstrated markedly abnormal appearance of the urinary bladder. Suspected minimal increase in size of the dominant now approximately 6.1 x 2.7 cm diverticulum arising from the right-sided the urinary bladder, previously, 4.7 x 2.5 cm. Additionally, there has been interval increase in size of dominant diverticulum arising from the superior aspect of the left side of the dome of the urinary bladder, currently measuring 2.4 x 2.1 cm (image 58, series 3), previously, 1.9 x 1.5 cm. Irregular wall thickening involving primarily the posterior cranial aspect of the urinary bladder wall (representative images 61, series 3, coronal image 57, series 601) appears grossly unchanged since the 01/2014 examination. There are 2 punctate opacities lying dependently within the urinary bladder (image 69, series 3) which are favored to represent bladder  stones. Normal appearance of the bilateral adrenal glands. Stomach/Bowel: Ingested enteric contrast extends to the level of the rectum. A large stool burden within the rectal fall. Extensive colonic diverticulosis without evidence of diverticulitis on this noncontrast examination. Small hiatal hernia. Normal noncontrast appearance of the terminal ileum and retrocecal appendix. No pneumoperitoneum, pneumatosis or portal venous gas. Vascular/Lymphatic: Normal caliber of the abdominal aorta. No bulky retroperitoneal, mesenteric, pelvic or inguinal lymphadenopathy on this noncontrast examination. Reproductive: The prostate is enlarged measuring 6.6 x 4.0 cm Other: Post left-sided inguinal hernia repair. Musculoskeletal: No acute or aggressive osseous abnormalities. Moderate multilevel lumbar spine DDD, worse at L4-L5 and L5-S1 with disc space height loss, endplate irregularity and sclerosis. IMPRESSION: 1. Re- demonstrated urinary bladder diverticuli with irregular thickening primarily involving the posterior cranial  aspect of the urinary bladder wall, unchanged to minimally progressed since the 01/2014 examination - while potentially secondary to chronic bladder outlet obstruction, an underlying lesion is not excluded on the basis of this examination. If not recently performed, further evaluation with urine cytology and/or cystoscopy could be performed as indicated. 2. Prostatomegaly. 3. Interval increase in size of the now approximately 2.7 cm hypo attenuating lesion arising from the superior pole of the right kidney, previously, 1.9 cm - while potentially representative of increased size of a benign renal cyst, this lesion is incompletely characterized on this noncontrast examination. Further evaluation with renal ultrasound could be performed as clinically indicated. 4. Suspected punctate (approximately 2 mm) urinary bladder stones. 5. Unchanged punctate (approximately 0.3 cm) nonobstructing right-sided renal stone. 6. Extensive colonic diverticulosis without evidence of diverticulitis. 7. Large colonic stool burden within the rectal vault. 8. Small hiatal hernia. Electronically Signed   By: Sandi Mariscal M.D.   On: 07/10/2015 15:40    Assessment & Plan:   Kriss was seen today for annual exam and hypertension.  Diagnoses and all orders for this visit:  Essential hypertension- His blood pressure is adequately well controlled.  Electrolytes and renal function are normal. -     CBC with Differential/Platelet; Future -     Comprehensive metabolic panel; Future -     Urinalysis, Routine w reflex microscopic; Future -     TSH; Future  Prediabetes- His A1c remains stable at 6.1%.  Medical therapy is not indicated. -     Comprehensive metabolic panel; Future -     Hemoglobin A1c; Future  Hyperlipidemia with target LDL less than 100- He has an elevated ASCVD risk score.  I have asked him if he is willing to target start taking a statin for CV risk reduction. -     Lipid panel; Future -     Comprehensive metabolic  panel; Future -     TSH; Future  Chronic left flank pain- He has chronic left flank pain and blood in the urine.  I have asked him to undergo CT scan of the abdomen and pelvis with contrast to see if there is a renal mass, renal cyst, stone, or bladder lesion. -     DG ABD ACUTE 2+V W 1V CHEST; Future -     CT Abdomen Pelvis W Contrast; Future  BPH with obstruction/lower urinary tract symptoms - He has no symptoms that need to be treated.  His PSA remains normal which is reassuring that he does not have prostate cancer. -     PSA; Future  Other microscopic hematuria- See above -     CT Abdomen Pelvis W Contrast; Future   I am having Eissa  [place] Shi maintain his tamsulosin, Fish Oil, Doxepin HCl, gabapentin, omeprazole, ALPRAZolam, and amLODipine-benazepril.  No orders of the defined types were placed in this encounter.    Follow-up: Return in about 6 months (around 11/29/2018).  Scarlette Calico, MD

## 2018-05-31 ENCOUNTER — Other Ambulatory Visit: Payer: Self-pay | Admitting: Internal Medicine

## 2018-05-31 DIAGNOSIS — E785 Hyperlipidemia, unspecified: Secondary | ICD-10-CM

## 2018-05-31 MED ORDER — ROSUVASTATIN CALCIUM 10 MG PO TABS: 10.0000 mg | ORAL_TABLET | Freq: Every day | ORAL | 1 refills | Status: DC

## 2018-06-02 ENCOUNTER — Telehealth: Payer: Self-pay | Admitting: Internal Medicine

## 2018-06-02 ENCOUNTER — Telehealth: Payer: Self-pay

## 2018-06-02 NOTE — Telephone Encounter (Signed)
Called pt to confirm appt. No travel No cough No fever No contact Pam Savayah Waltrip, CMA 

## 2018-06-02 NOTE — Telephone Encounter (Signed)
Called Dr. Ronnald Ramp office today 13:34 06/02/2018 to verify if this needed to be a hematuria protocol or regular A/P. Spk w/Tammy(?) who was messaging Dr. Ronnald Ramp for clarification. They will c/b so pt does not drink if not necessary.

## 2018-06-02 NOTE — Telephone Encounter (Signed)
Imaging center has called and states that office notes for this test ordered says "no hematuria", but when the actual test was ordered, it was ordered as hematuria w/ renal parenchymal suspected----so do you want hematuria protocol or just basic abdomen pelvis with contrast----ct tech at imaging center needs to clarify order----we need to call imaging today if at all possible, but we definitely need to answer this by Monday morning at 7:30am so that she can tell the patient not to drink (if needed for test you want)----call Janelle at [phone].  Patient is scheduled for 9:30am Monday morning for imaging test.---routing to dr Ronnald Ramp, please advise, thanks

## 2018-06-03 NOTE — Telephone Encounter (Signed)
The patient denied hematuria but there was blood in his urine.  This is called microscopic hematuria.  Go ahead and do the scan that was ordered.

## 2018-06-05 ENCOUNTER — Encounter: Payer: Self-pay | Admitting: Internal Medicine

## 2018-06-05 ENCOUNTER — Ambulatory Visit (INDEPENDENT_AMBULATORY_CARE_PROVIDER_SITE_OTHER)
Admission: RE | Admit: 2018-06-05 | Discharge: 2018-06-05 | Disposition: A | Discharge status: Home / Self Care | Payer: Medicare HMO | Source: Ambulatory Visit | Attending: Internal Medicine | Admitting: Internal Medicine

## 2018-06-05 ENCOUNTER — Other Ambulatory Visit: Payer: Self-pay

## 2018-06-05 DIAGNOSIS — R3129 Other microscopic hematuria: Secondary | ICD-10-CM | POA: Diagnosis not present

## 2018-06-05 DIAGNOSIS — G8929 Other chronic pain: Secondary | ICD-10-CM

## 2018-06-05 DIAGNOSIS — R109 Unspecified abdominal pain: Secondary | ICD-10-CM

## 2018-06-05 DIAGNOSIS — N210 Calculus in bladder: Secondary | ICD-10-CM | POA: Diagnosis not present

## 2018-06-05 MED ORDER — IOHEXOL 300 MG/ML  SOLN
100.0000 mL | Freq: Once | INTRAMUSCULAR | Status: AC | PRN
  Administered 2018-06-05: 100 mL via INTRAVENOUS

## 2018-06-05 NOTE — Telephone Encounter (Signed)
Mark Jordan/imaging center advised, they will do test as ordered

## 2018-06-06 NOTE — Telephone Encounter (Signed)
Dr. Rosana Hoes the urologist has scheduled an appt with the Pt but its way out in August. Dr Rosana Hoes advised the Pt that if Dr. Ronnald Ramp called the office letting them know that the Pt needs to be seen or worked in sooner they may be able to move the appt up. / please advise

## 2018-06-07 ENCOUNTER — Other Ambulatory Visit: Payer: Medicare HMO

## 2018-06-08 NOTE — Telephone Encounter (Signed)
Emailed pt with appt info

## 2018-06-19 DIAGNOSIS — N35012 Post-traumatic membranous urethral stricture: Secondary | ICD-10-CM | POA: Diagnosis not present

## 2018-06-19 DIAGNOSIS — N200 Calculus of kidney: Secondary | ICD-10-CM | POA: Diagnosis not present

## 2018-06-19 DIAGNOSIS — N281 Cyst of kidney, acquired: Secondary | ICD-10-CM | POA: Diagnosis not present

## 2018-06-19 DIAGNOSIS — N323 Diverticulum of bladder: Secondary | ICD-10-CM | POA: Diagnosis not present

## 2018-06-19 DIAGNOSIS — N400 Enlarged prostate without lower urinary tract symptoms: Secondary | ICD-10-CM | POA: Diagnosis not present

## 2018-07-26 ENCOUNTER — Other Ambulatory Visit: Payer: Self-pay | Admitting: Internal Medicine

## 2018-07-26 DIAGNOSIS — F411 Generalized anxiety disorder: Secondary | ICD-10-CM

## 2018-08-06 DIAGNOSIS — H04203 Unspecified epiphora, bilateral lacrimal glands: Secondary | ICD-10-CM | POA: Diagnosis not present

## 2018-08-06 DIAGNOSIS — H9209 Otalgia, unspecified ear: Secondary | ICD-10-CM | POA: Diagnosis not present

## 2018-08-06 DIAGNOSIS — Z20828 Contact with and (suspected) exposure to other viral communicable diseases: Secondary | ICD-10-CM | POA: Diagnosis not present

## 2018-08-06 DIAGNOSIS — J020 Streptococcal pharyngitis: Secondary | ICD-10-CM | POA: Diagnosis not present

## 2018-08-06 DIAGNOSIS — R05 Cough: Secondary | ICD-10-CM | POA: Diagnosis not present

## 2018-08-06 DIAGNOSIS — R1319 Other dysphagia: Secondary | ICD-10-CM | POA: Diagnosis not present

## 2018-08-06 DIAGNOSIS — R0982 Postnasal drip: Secondary | ICD-10-CM | POA: Diagnosis not present

## 2018-08-06 DIAGNOSIS — J029 Acute pharyngitis, unspecified: Secondary | ICD-10-CM | POA: Diagnosis not present

## 2018-08-22 ENCOUNTER — Encounter: Payer: Self-pay | Admitting: Internal Medicine

## 2018-08-22 ENCOUNTER — Other Ambulatory Visit: Payer: Self-pay

## 2018-08-22 ENCOUNTER — Ambulatory Visit (INDEPENDENT_AMBULATORY_CARE_PROVIDER_SITE_OTHER): Payer: Medicare HMO | Admitting: Internal Medicine

## 2018-08-22 ENCOUNTER — Ambulatory Visit: Payer: Self-pay | Admitting: *Deleted

## 2018-08-22 DIAGNOSIS — I10 Essential (primary) hypertension: Secondary | ICD-10-CM

## 2018-08-22 DIAGNOSIS — R7303 Prediabetes: Secondary | ICD-10-CM

## 2018-08-22 DIAGNOSIS — R202 Paresthesia of skin: Secondary | ICD-10-CM | POA: Insufficient documentation

## 2018-08-22 NOTE — Patient Instructions (Signed)
It appears likely that you have a very mild nerve irritation starting at the left neck, with the tingling to the arm coming from that  Please continue to monitor this and consider a visit with Dr Ronnald Ramp if you think the pain or weakness worsens and you may need an MRI of the neck  Please continue all other medications as before, and refills have been done if requested.  Please have the pharmacy call with any other refills you may need.  Please keep your appointments with your specialists as you may have planned

## 2018-08-22 NOTE — Progress Notes (Signed)
Subjective:    Patient ID: Mark Jordan, male    DOB: 1940/04/13, 78 y.o.   MRN: [id number]  HPI  Here to f/u with c/o left post lat neck pain mild intermittent sharp and dull x 1 wk with some radiation to the left upper back and somewhat the left shoulder. Did have left shoulder surgury several years ago and wondering if could be related.  Pt denies bowel or bladder change, fever, wt loss, worsening UE or LE pain/numbness/weakness, gait change or falls except for some tingling to the upper left arm as well.  No loss grip strength.  Worse to lie down at night, hard to find a right position, wakes him up sometimes, hard to see the left sided blind spot while driving.  No fever, chills. Pt denies chest pain, increased sob or doe, wheezing, orthopnea, PND, increased LE swelling, palpitations, dizziness or syncope.   Pt denies polydipsia, polyuria, or low sugar symptoms such as weakness or confusion improved with po intake.  Pt states overall good compliance with meds, trying to follow lower cholesterol, diabetic diet, wt overall stable but little exercise however.    Past Medical History:  Diagnosis Date  . Allergic rhinitis   . Anxiety   . Chronic low back pain   . Diverticulosis of colon   . GERD (gastroesophageal reflux disease)   . HTN (hypertension)   . Hypercholesterolemia   . Kidney calculus   . PUD (peptic ulcer disease)    Past Surgical History:  Procedure Laterality Date  . COLONOSCOPY  2014  . INGUINAL HERNIA REPAIR     left  . LEG SURGERY    . ROTATOR CUFF REPAIR  5/08   left ; Dr Onnie Graham  . TOTAL SHOULDER ARTHROPLASTY  8/11   after fall w/ prox humerus fx    reports that he has never smoked. He has never used smokeless tobacco. He reports that he does not drink alcohol or use drugs. family history includes Heart failure in his father; Pneumonia in his mother. No Known Allergies Current Outpatient Medications on File Prior to Visit  Medication Sig Dispense Refill  .  ALPRAZolam (XANAX) 0.5 MG tablet TAKE 1/2 TO 1 TABLET BY MOUTH 3 TIMES DAILY AS NEEDED FOR ANXIETY 90 tablet 2  . amLODipine-benazepril (LOTREL) 10-20 MG capsule TAKE 1 CAPSULE BY MOUTH ONCE DAILY 90 capsule 0  . Doxepin HCl (SILENOR) 3 MG TABS Take 1 tablet (3 mg total) by mouth at bedtime as needed. 90 tablet 3  . gabapentin (NEURONTIN) 300 MG capsule TAKE ONE CAPSULE BY MOUTH AT BEDTIME 30 capsule 3  . Omega-3 Fatty Acids (FISH OIL) 1000 MG CPDR Take 1,000 mcg by mouth daily.    Marland Kitchen omeprazole (PRILOSEC) 40 MG capsule TAKE ONE CAPSULE BY MOUTH DAILY 90 capsule 1  . rosuvastatin (CRESTOR) 10 MG tablet Take 1 tablet (10 mg total) by mouth daily. 90 tablet 1  . Tamsulosin HCl (FLOMAX) 0.4 MG CAPS Take 0.4 mg by mouth daily after supper.      No current facility-administered medications on file prior to visit.    Review of Systems  Constitutional: Negative for other unusual diaphoresis or sweats HENT: Negative for ear discharge or swelling Eyes: Negative for other worsening visual disturbances Respiratory: Negative for stridor or other swelling  Gastrointestinal: Negative for worsening distension or other blood Genitourinary: Negative for retention or other urinary change Musculoskeletal: Negative for other MSK pain or swelling Skin: Negative for color change or other new  lesions Neurological: Negative for worsening tremors and other numbness  Psychiatric/Behavioral: Negative for worsening agitation or other fatigue All other system neg per pt    Objective:   Physical Exam BP 118/78   Pulse 62   Temp 97.8 F (36.6 C) (Oral)   Ht 5\' 8"  (1.727 m)   Wt 152 lb (68.9 kg)   SpO2 96%   BMI 23.11 kg/m  VS noted,  Constitutional: Pt appears in NAD HENT: Head: NCAT.  Right Ear: External ear normal.  Left Ear: External ear normal.  Eyes: . Pupils are equal, round, and reactive to light. Conjunctivae and EOM are normal Nose: without d/c or deformity Neck: Neck supple. Gross normal ROM  except for horizontal turning to the left, mild tender left paracervical muscular tenderness and left trapezoid tender without swelling or rash Cardiovascular: Normal rate and regular rhythm.   Pulmonary/Chest: Effort normal and breath sounds without rales or wheezing.  Neurological: Pt is alert. At baseline orientation, motor 5/5 intact, sens intact, cn 2-12 intact Skin: Skin is warm. No rashes, other new lesions, no LE edema Psychiatric: Pt behavior is normal without agitation  No other exam findings Lab Results  Component Value Date   WBC 4.6 05/30/2018   HGB 14.5 05/30/2018   HCT 43.2 05/30/2018   PLT 173.0 05/30/2018   GLUCOSE 94 05/30/2018   CHOL 186 05/30/2018   TRIG 48.0 05/30/2018   HDL 37.50 (L) 05/30/2018   LDLDIRECT 144.2 02/07/2013   LDLCALC 139 (H) 05/30/2018   ALT 14 05/30/2018   AST 15 05/30/2018   NA 141 05/30/2018   K 4.1 05/30/2018   CL 105 05/30/2018   CREATININE 0.66 05/30/2018   BUN 18 05/30/2018   CO2 29 05/30/2018   TSH 2.20 05/30/2018   PSA 0.97 05/30/2018   INR 0.97 09/10/2009   HGBA1C 6.1 05/30/2018      Assessment & Plan:

## 2018-08-22 NOTE — Telephone Encounter (Signed)
He is seeing you doc today.

## 2018-08-22 NOTE — Telephone Encounter (Signed)
Pt calling with complaints of left arm tingling for the last 2-3 weeks off and on. Pt also states that since Saturday he gets dizzy at times. Pt's reports that his BP this am was 123/65 HR-71. Pt states he is at work currently and has dizzy spells with getting up. Feels like I did with bleeding ulcers but that happened about 20 years ago.Pt states he has not had dark colored stools and not constipated. Pt states that he had left shoulder surgery several years ago and does not have pain. Pt states he also had scratchy throat 2 weeks ago and was prescribed Penicillin after going to the walk in clinic at [facility]. Pt was tested for COVID-19 which was negative.Pt advised to seek treatment in the ED if symptoms became worse or severe. Understanding verbalized. Call transferred to [place] in the office to schedule appt.  Reason for Disposition . Taking a medicine that could cause dizziness (e.g., blood pressure medications, diuretics)  Answer Assessment - Initial Assessment Questions 1. SYMPTOM: "What is the main symptom you are concerned about?" (e.g., weakness, numbness)     Tingling in left arm 2. ONSET: "When did this start?" (minutes, hours, days; while sleeping)     2-3 weeks ago 3. LAST NORMAL: "When was the last time you were normal (no symptoms)?"     2-3 weeks ago 4. PATTERN "Does this come and go, or has it been constant since it started?"  "Is it present now?"     Comes and goes 5. CARDIAC SYMPTOMS: "Have you had any of the following symptoms: chest pain, difficulty breathing, palpitations?"     No, chest pain occasionally but not currently 6. NEUROLOGIC SYMPTOMS: "Have you had any of the following symptoms: headache, dizziness, vision loss, double vision, changes in speech, unsteady on your feet?"     No 7. OTHER SYMPTOMS: "Do you have any other symptoms?"     No 8. PREGNANCY: "Is there any chance you are pregnant?" "When was your last menstrual period?"     n/a  Answer Assessment -  Initial Assessment Questions 1. DESCRIPTION: "Describe your dizziness."     Feels dizzy off and on 2. LIGHTHEADED: "Do you feel lightheaded?" (e.g., somewhat faint, woozy, weak upon standing)     Yes, feels woozy 3. VERTIGO: "Do you feel like either you or the room is spinning or tilting?" (i.e. vertigo)     No 4. SEVERITY: "How bad is it?"  "Do you feel like you are going to faint?" "Can you stand and walk?"   - MILD - walking normally   - MODERATE - interferes with normal activities (e.g., work, school)    - SEVERE - unable to stand, requires support to walk, feels like passing out now.      mild 5. ONSET:  "When did the dizziness begin?"     saturday 6. AGGRAVATING FACTORS: "Does anything make it worse?" (e.g., standing, change in head position)     standing 7. HEART RATE: "Can you tell me your heart rate?" "How many beats in 15 seconds?"  (Note: not all patients can do this)      71 8. CAUSE: "What do you think is causing the dizziness?"     unknown 9. RECURRENT SYMPTOM: "Have you had dizziness before?" If so, ask: "When was the last time?" "What happened that time?"     No 10. OTHER SYMPTOMS: "Do you have any other symptoms?" (e.g., fever, chest pain, vomiting, diarrhea, bleeding)  Chest pains occasionally but not currently 11. PREGNANCY: "Is there any chance you are pregnant?" "When was your last menstrual period?"       n/a  Protocols used: DIZZINESS - LIGHTHEADEDNESS-A-AH, NEUROLOGIC DEFICIT-A-AH

## 2018-08-26 ENCOUNTER — Encounter: Payer: Self-pay | Admitting: Internal Medicine

## 2018-08-26 NOTE — Assessment & Plan Note (Signed)
With left post lateral neck pain and tender, mild, but no other neuro changes; d/w pt pain control, muscle relaxer, PT and/or MRI for the neck but after discussion decided he did not want specific tx at this time

## 2018-08-26 NOTE — Assessment & Plan Note (Signed)
stable overall by history and exam, recent data reviewed with pt, and pt to continue medical treatment as before,  to f/u any worsening symptoms or concerns  

## 2018-09-13 ENCOUNTER — Other Ambulatory Visit: Payer: Self-pay | Admitting: Internal Medicine

## 2018-09-13 DIAGNOSIS — K2100 Gastro-esophageal reflux disease with esophagitis, without bleeding: Secondary | ICD-10-CM

## 2018-09-30 ENCOUNTER — Other Ambulatory Visit: Payer: Self-pay

## 2018-09-30 ENCOUNTER — Emergency Department (HOSPITAL_BASED_OUTPATIENT_CLINIC_OR_DEPARTMENT_OTHER)
Admission: EM | Admit: 2018-09-30 | Discharge: 2018-09-30 | Disposition: A | Discharge status: Home / Self Care | Payer: Medicare HMO | Attending: Emergency Medicine | Admitting: Emergency Medicine

## 2018-09-30 ENCOUNTER — Encounter (HOSPITAL_BASED_OUTPATIENT_CLINIC_OR_DEPARTMENT_OTHER): Payer: Self-pay | Admitting: Emergency Medicine

## 2018-09-30 ENCOUNTER — Emergency Department (HOSPITAL_BASED_OUTPATIENT_CLINIC_OR_DEPARTMENT_OTHER): Payer: Medicare HMO

## 2018-09-30 DIAGNOSIS — R0789 Other chest pain: Secondary | ICD-10-CM | POA: Insufficient documentation

## 2018-09-30 DIAGNOSIS — R7303 Prediabetes: Secondary | ICD-10-CM | POA: Diagnosis not present

## 2018-09-30 DIAGNOSIS — R079 Chest pain, unspecified: Secondary | ICD-10-CM | POA: Diagnosis not present

## 2018-09-30 DIAGNOSIS — I10 Essential (primary) hypertension: Secondary | ICD-10-CM | POA: Diagnosis not present

## 2018-09-30 DIAGNOSIS — E782 Mixed hyperlipidemia: Secondary | ICD-10-CM | POA: Insufficient documentation

## 2018-09-30 DIAGNOSIS — R001 Bradycardia, unspecified: Secondary | ICD-10-CM | POA: Diagnosis not present

## 2018-09-30 DIAGNOSIS — Z79899 Other long term (current) drug therapy: Secondary | ICD-10-CM | POA: Diagnosis not present

## 2018-09-30 LAB — CBC
HCT: 47.1 % (ref 39.0–52.0)
Hemoglobin: 14.8 g/dL (ref 13.0–17.0)
MCH: 30 pg (ref 26.0–34.0)
MCHC: 31.4 g/dL (ref 30.0–36.0)
MCV: 95.5 fL (ref 80.0–100.0)
Platelets: 195 K/uL (ref 150–400)
RBC: 4.93 MIL/uL (ref 4.22–5.81)
RDW: 13 % (ref 11.5–15.5)
WBC: 6.1 K/uL (ref 4.0–10.5)
nRBC: 0 % (ref 0.0–0.2)

## 2018-09-30 LAB — BASIC METABOLIC PANEL WITH GFR
Anion gap: 10 (ref 5–15)
BUN: 23 mg/dL (ref 8–23)
CO2: 26 mmol/L (ref 22–32)
Calcium: 9.1 mg/dL (ref 8.9–10.3)
Chloride: 104 mmol/L (ref 98–111)
Creatinine, Ser: 0.82 mg/dL (ref 0.61–1.24)
GFR calc Af Amer: >60 mL/min
GFR calc non Af Amer: >60 mL/min
Glucose, Bld: 110 mg/dL — ABNORMAL HIGH (ref 70–99)
Potassium: 3.8 mmol/L (ref 3.5–5.1)
Sodium: 140 mmol/L (ref 135–145)

## 2018-09-30 LAB — TROPONIN I (HIGH SENSITIVITY)
Troponin I (High Sensitivity): 5 ng/L
Troponin I (High Sensitivity): 6 ng/L

## 2018-09-30 NOTE — ED Provider Notes (Signed)
[facility] EMERGENCY DEPARTMENT Provider Note   CSN: [id number] Arrival date & time: 09/30/18  1551     History   Chief Complaint Chief Complaint  Patient presents with   Chest Pain    HPI Mark Jordan is a 78 y.o. male.     The history is provided by the patient.  Chest Pain Pain location:  L chest Pain quality: sharp   Pain radiates to:  Does not radiate Pain severity:  Moderate Onset quality:  Sudden Duration: seconds. Timing:  Sporadic Progression:  Worsening Chronicity:  Recurrent Context comment:  Occurs at anytime.  not associated with anything Relieved by:  None tried Worsened by:  Nothing Ineffective treatments:  None tried Associated symptoms: no abdominal pain, no anorexia, no cough, no diaphoresis, no dizziness, no dysphagia, no fever, no headache, no lower extremity edema, no nausea, no near-syncope, no palpitations, no shortness of breath, no vomiting and no weakness   Associated symptoms comment:  Has had complete shoulder reconstruction on the left and states will intermittently get numbness and tingling in the left arm and in the shoulder blade but is not associated with the chest pain.  States that the shoulder issues have been ongoing for years since the reconstruction 8 years ago.  Also states the chest pending has been present for years and last just seconds but has been happening more frequently lately.  Last episode was at 2 PM today that lasted for seconds.  This occurred at rest while he was driving. Risk factors: high cholesterol, hypertension and male sex   Risk factors: no coronary artery disease, no diabetes mellitus, no prior DVT/PE, no smoking and no surgery   Risk factors comment:  No family history of cardiac disease   Past Medical History:  Diagnosis Date   Allergic rhinitis    Anxiety    Chronic low back pain    Diverticulosis of colon    GERD (gastroesophageal reflux disease)    HTN (hypertension)     Hypercholesterolemia    Kidney calculus    PUD (peptic ulcer disease)     Patient Active Problem List   Diagnosis Date Noted   Arm paresthesia, left 08/22/2018   Chronic left flank pain 05/30/2018   Other microscopic hematuria 05/30/2018   Routine general medical examination at a health care facility 03/24/2017   Arthralgia of right acromioclavicular joint 04/20/2016   Lumbar radicular pain 10/08/2015   Cervical radiculitis 10/08/2015   GAD (generalized anxiety disorder) 07/03/2015   Prediabetes 11/01/2014   Insomnia, persistent 10/29/2013   Osteoarthritis of right wrist 06/01/2013   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 Authorizing Provider  ALPRAZolam Duanne Moron) 0.5 MG tablet TAKE 1/2 TO 1 TABLET BY MOUTH 3 TIMES DAILY AS NEEDED FOR ANXIETY 07/26/18   Janith Lima, MD  amLODipine-benazepril (LOTREL)  10-20 MG capsule TAKE 1 CAPSULE BY MOUTH ONCE DAILY 05/25/18   Janith Lima, MD  Doxepin HCl (SILENOR) 3 MG TABS Take 1 tablet (3 mg total) by mouth at bedtime as needed. 03/17/16   Janith Lima, MD  gabapentin (NEURONTIN) 300 MG capsule TAKE ONE CAPSULE BY MOUTH AT BEDTIME 10/11/17   Janith Lima, MD  Omega-3 Fatty Acids (FISH OIL) 1000 MG CPDR Take 1,000 mcg by mouth daily.    [provider]  omeprazole (PRILOSEC) 40 MG capsule TAKE ONE CAPSULE BY MOUTH DAILY 09/13/18   Janith Lima,  MD  rosuvastatin (CRESTOR) 10 MG tablet Take 1 tablet (10 mg total) by mouth daily. 05/31/18   Janith Lima, MD  Tamsulosin HCl (FLOMAX) 0.4 MG CAPS Take 0.4 mg by mouth daily after supper.  12/01/10   [provider]    Family History Family History  Problem Relation Age of Onset   Heart failure Father    Pneumonia Mother    Diabetes Neg Hx    Stroke Neg Hx    Heart attack Neg Hx     Social History Social History   Tobacco Use   Smoking status: Never Smoker   Smokeless tobacco: Never Used  Substance Use Topics   Alcohol use: No   Drug use: No     Allergies   Patient has no known allergies.   Review of Systems Review of Systems  Constitutional: Negative for diaphoresis and fever.  HENT: Negative for trouble swallowing.   Respiratory: Negative for cough and shortness of breath.   Cardiovascular: Positive for chest pain. Negative for palpitations and near-syncope.  Gastrointestinal: Negative for abdominal pain, anorexia, nausea and vomiting.  Neurological: Negative for dizziness, weakness and headaches.  All other systems reviewed and are negative.    Physical Exam Updated Vital Signs BP (!) 153/89    Pulse 61    Temp 99.1 F (37.3 C) (Oral)    Resp 15    Ht 5\' 8"  (1.727 m)    Wt 70.3 kg    SpO2 96%    BMI 23.57 kg/m   Physical Exam Vitals signs and nursing note reviewed.  Constitutional:      General: He is not in acute distress.    Appearance: He is well-developed and normal weight.  HENT:     Head: Normocephalic and atraumatic.  Eyes:     Conjunctiva/sclera: Conjunctivae normal.     Pupils: Pupils are equal, round, and reactive to light.  Neck:     Musculoskeletal: Normal range of motion and neck supple.  Cardiovascular:     Rate and Rhythm: Normal rate and regular rhythm.     Heart sounds: No murmur.  Pulmonary:     Effort: Pulmonary effort is normal. No respiratory distress.     Breath sounds: Normal breath sounds. No wheezing  or rales.  Chest:     Chest wall: No mass, deformity or tenderness.  Abdominal:     General: There is no distension.     Palpations: Abdomen is soft.     Tenderness: There is no abdominal tenderness. There is no guarding or rebound.  Musculoskeletal: Normal range of motion.        General: No tenderness.     Comments: Tenderness with palpation of the left shoulder and range of motion.  Also mild tenderness with palpation of the scapula and trapezius.  No pain reproduced with palpation in the chest  Skin:    General:  Skin is warm and dry.     Findings: No erythema or rash.  Neurological:     General: No focal deficit present.     Mental Status: He is alert and oriented to person, place, and time.  Psychiatric:        Mood and Affect: Mood normal.        Behavior: Behavior normal.      ED Treatments / Results  Labs (all labs ordered are listed, but only abnormal results are displayed) Labs Reviewed  BASIC METABOLIC PANEL - Abnormal; Notable for the following components:      Result Value   Glucose, Bld 110 (*)    All other components within normal limits  CBC  TROPONIN I (HIGH SENSITIVITY)  TROPONIN I (HIGH SENSITIVITY)    EKG EKG Interpretation  Date/Time:  Saturday September 30 2018 16:00:28 EDT Ventricular Rate:  58 PR Interval:  172 QRS Duration: 86 QT Interval:  422 QTC Calculation: 414 R Axis:   64 Text Interpretation:  Sinus bradycardia Otherwise normal ECG No significant change since last tracing Confirmed by Blanchie Dessert [phone]) on 09/30/2018 4:31:27 PM   Radiology Dg Chest 2 View  Result Date: 09/30/2018 CLINICAL DATA:  Left-sided chest pain. EXAM: CHEST - 2 VIEW COMPARISON:  March 24, 2013 FINDINGS: A tortuous thoracic aorta is stable. The heart, hila, and mediastinum are unchanged. No pneumothorax. No nodules or masses. No focal infiltrates. IMPRESSION: No active cardiopulmonary disease. Electronically Signed   By: Dorise Bullion III M.D   On:  09/30/2018 16:14    Procedures Procedures (including critical care time)  Medications Ordered in ED Medications - No data to display   Initial Impression / Assessment and Plan / ED Course  I have reviewed the triage vital signs and the nursing notes.  Pertinent labs & imaging results that were available during my care of the patient were reviewed by me and considered in my medical decision making (see chart for details).       78 year old male presenting today with atypical chest pain that is been present intermittently for a year but is been happening more frequently in the last week.  The pain is nonexertional and has no other associated symptoms.  He denies any fever, cough or shortness of breath.  The pain lasts for seconds.  He has seen his regular doctor and gone to [facility] for this pain and has had normal EKGs but states he has never had his blood checked.  He is well-appearing today and pain is not reproducible.  Low risk well's and unlikely to be PE. He is not having pain currently and EKG is normal.  CBC, BMP and initial troponin with no concerning findings.  Heart score of 3. Patient's chest x-Tavita is within normal limits.  We will do a delta troponin.  Feel that it is most likely musculoskeletal but did recommend he follow-up with Dr. Ronnald Ramp and possibly be evaluated by cardiologist in the future.  Repeat troponin is 6 and delta troponin is 1.  Low suspicion that this is ACS but again did encourage patient to follow-up with his PCP however feel reasonable that he can be discharged home today.  Final Clinical Impressions(s) / ED Diagnoses   Final diagnoses:  Atypical chest pain    ED Discharge Orders    None       Blanchie Dessert, MD 09/30/18 1929

## 2018-09-30 NOTE — ED Triage Notes (Signed)
Intermittent chest pain x 1 month, radiates into L arm. No pain at present.

## 2018-09-30 NOTE — ED Notes (Signed)
Pt describes CP that is intermittent and lasts a few seconds at a time. Pt describes it as "a crazy pain. Not sharp. Not dull."

## 2018-10-02 ENCOUNTER — Other Ambulatory Visit: Payer: Self-pay | Admitting: Internal Medicine

## 2018-10-04 DIAGNOSIS — R300 Dysuria: Secondary | ICD-10-CM | POA: Diagnosis not present

## 2018-10-13 DIAGNOSIS — N323 Diverticulum of bladder: Secondary | ICD-10-CM | POA: Diagnosis not present

## 2018-10-13 DIAGNOSIS — N200 Calculus of kidney: Secondary | ICD-10-CM | POA: Diagnosis not present

## 2018-10-13 DIAGNOSIS — N35012 Post-traumatic membranous urethral stricture: Secondary | ICD-10-CM | POA: Diagnosis not present

## 2018-10-13 DIAGNOSIS — N281 Cyst of kidney, acquired: Secondary | ICD-10-CM | POA: Diagnosis not present

## 2018-10-13 DIAGNOSIS — N401 Enlarged prostate with lower urinary tract symptoms: Secondary | ICD-10-CM | POA: Diagnosis not present

## 2018-10-13 DIAGNOSIS — R3914 Feeling of incomplete bladder emptying: Secondary | ICD-10-CM | POA: Diagnosis not present

## 2018-10-13 DIAGNOSIS — N138 Other obstructive and reflux uropathy: Secondary | ICD-10-CM | POA: Diagnosis not present

## 2018-12-01 ENCOUNTER — Telehealth: Payer: Self-pay | Admitting: Internal Medicine

## 2018-12-01 NOTE — Telephone Encounter (Signed)
lvm for pt to call back.   RE: PCP is not here today. He will not return until Monday. We need more that 1 business day notice for refill requests.

## 2018-12-01 NOTE — Telephone Encounter (Signed)
Patient requesting RX for antibiotic as he has dental appointment on Monday 11/2. He is requesting a call back from [place] when sent, if possible.

## 2018-12-05 ENCOUNTER — Other Ambulatory Visit: Payer: Self-pay | Admitting: Internal Medicine

## 2018-12-05 DIAGNOSIS — F411 Generalized anxiety disorder: Secondary | ICD-10-CM

## 2018-12-11 DIAGNOSIS — L0390 Cellulitis, unspecified: Secondary | ICD-10-CM | POA: Diagnosis not present

## 2018-12-11 DIAGNOSIS — L02212 Cutaneous abscess of back [any part, except buttock]: Secondary | ICD-10-CM | POA: Diagnosis not present

## 2018-12-12 ENCOUNTER — Other Ambulatory Visit: Payer: Self-pay | Admitting: Internal Medicine

## 2018-12-12 DIAGNOSIS — E785 Hyperlipidemia, unspecified: Secondary | ICD-10-CM

## 2019-01-11 ENCOUNTER — Ambulatory Visit (INDEPENDENT_AMBULATORY_CARE_PROVIDER_SITE_OTHER): Payer: Medicare HMO | Admitting: Sports Medicine

## 2019-01-11 ENCOUNTER — Other Ambulatory Visit: Payer: Self-pay | Admitting: Sports Medicine

## 2019-01-11 ENCOUNTER — Other Ambulatory Visit: Payer: Self-pay

## 2019-01-11 ENCOUNTER — Encounter: Payer: Self-pay | Admitting: Sports Medicine

## 2019-01-11 DIAGNOSIS — L84 Corns and callosities: Secondary | ICD-10-CM | POA: Diagnosis not present

## 2019-01-11 DIAGNOSIS — M2042 Other hammer toe(s) (acquired), left foot: Secondary | ICD-10-CM

## 2019-01-11 DIAGNOSIS — M7752 Other enthesopathy of left foot: Secondary | ICD-10-CM | POA: Diagnosis not present

## 2019-01-11 DIAGNOSIS — M79675 Pain in left toe(s): Secondary | ICD-10-CM | POA: Diagnosis not present

## 2019-01-11 MED ORDER — TRIAMCINOLONE ACETONIDE 10 MG/ML IJ SUSP
10.0000 mg | Freq: Once | INTRAMUSCULAR | Status: AC
  Administered 2019-01-11: 13:00:00 10 mg

## 2019-01-11 NOTE — Progress Notes (Signed)
Subjective: Mark Jordan is a 78 y.o. male patient who returns to office for follow-up evaluation of Left foot pain secondary to callus skin at the 3rd toe. Patient reports that the last treatment really helped and states that now it is flared up and over the last month there has been a stinging and burning pain worse with shoes pain is 5 out of 10. A patient denies any changes with medical history or any new problems or injuries to the affected area.  Patient Active Problem List   Diagnosis Date Noted  . Arm paresthesia, left 08/22/2018  . Chronic left flank pain 05/30/2018  . Other microscopic hematuria 05/30/2018  . Routine general medical examination at a health care facility 03/24/2017  . Arthralgia of right acromioclavicular joint 04/20/2016  . Lumbar radicular pain 10/08/2015  . Cervical radiculitis 10/08/2015  . GAD (generalized anxiety disorder) 07/03/2015  . Prediabetes 11/01/2014  . Insomnia, persistent 10/29/2013  . 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 DJD (degenerative joint disease) 02/21/2012  . Hyperlipidemia with target LDL less than 100 11/22/2006  . Essential hypertension 11/22/2006  . ALLERGIC RHINITIS 11/22/2006  . GERD 11/22/2006    Current Outpatient Medications on File Prior to Visit  Medication Sig Dispense Refill  . finasteride (PROSCAR) 5 MG tablet Take by mouth.    . ALPRAZolam (XANAX) 0.5 MG tablet TAKE 1/2-1 TABLET BY MOUTH ONCE DAILY ATBEDTIME AS NEEDED ONLY. 30 tablet 5  . amLODipine-benazepril (LOTREL) 10-20 MG capsule TAKE 1 CAPSULE BY MOUTH ONCE DAILY 90 capsule 0  . Doxepin HCl (SILENOR) 3 MG TABS Take 1 tablet (3 mg total) by mouth at bedtime as needed. 90 tablet 3  . gabapentin (NEURONTIN) 300 MG capsule TAKE ONE CAPSULE BY MOUTH AT BEDTIME 30 capsule 3  . Omega-3 Fatty Acids (FISH OIL) 1000 MG CPDR Take 1,000 mcg by mouth daily.    Marland Kitchen omeprazole (PRILOSEC) 40 MG capsule TAKE  ONE CAPSULE BY MOUTH DAILY 90 capsule 1  . rosuvastatin (CRESTOR) 10 MG tablet TAKE ONE (1) TABLET ONCE DAILY 90 tablet 1  . Tamsulosin HCl (FLOMAX) 0.4 MG CAPS Take 0.4 mg by mouth daily after supper.      No current facility-administered medications on file prior to visit.    No Known Allergies  Objective:  General: Alert and oriented x3 in no acute distress  Dermatology: Keratotic lesion present lateral 3rd toe on left with skin lines transversing the lesion, pain is present with direct pressure to the lesion with a central nucleated core noted, no webspace macerations, no ecchymosis bilateral, surgical scars well healed, all nails x 10 are thick but well manicured.  Vascular: Dorsalis Pedis and Posterior Tibial pedal pulses 1/4, Capillary Fill Time 3 seconds, + pedal hair growth bilateral, no edema bilateral lower extremities, Temperature gradient within normal limits.  Neurology: Johney Maine sensation intact via light touch bilateral.  Musculoskeletal: Mild tenderness with palpation at the keratotic lesion site on left 3rd toe with crossover hammertoe deformity as previously noted, Muscular strength 4/5 in all groups without pain or limitation on range of motion. + Bunion and hammertoe lower extremity muscular or boney deformity noted.  Assessment and Plan: Problem List Items Addressed This Visit    None    Visit Diagnoses    Capsulitis of toe of left foot    -  Primary   Relevant Medications   triamcinolone acetonide (KENALOG) 10 MG/ML injection 10 mg (Completed) (Start on  01/11/2019 12:45 PM)   Corns and callosities       Hammertoe of left foot       Toe pain, left          -Complete examination performed -Patient declined a new set of x-rays at today's visit -Discussed treatment options for painful corn at left third toe with hammertoe deformity -After oral consent and aseptic prep, injected a mixture containing 1 ml of 2%  plain lidocaine, 1 ml 0.5% plain marcaine at the  inflamed corn area at the left third toe and then mechanically debrided using a sterile chisel blade the corn. Patient tolerated the procedure and debridement of corn without complication -Dispensed toe silicone cap -Advised good supportive shoes and inserts -Patient to return to office as needed and advised patient if continues to be a problem may benefit from repeat hammertoe surgery versus amputation.  Landis Martins, DPM

## 2019-01-17 DIAGNOSIS — M5137 Other intervertebral disc degeneration, lumbosacral region: Secondary | ICD-10-CM | POA: Diagnosis not present

## 2019-01-17 DIAGNOSIS — M9903 Segmental and somatic dysfunction of lumbar region: Secondary | ICD-10-CM | POA: Diagnosis not present

## 2019-01-17 DIAGNOSIS — M5441 Lumbago with sciatica, right side: Secondary | ICD-10-CM | POA: Diagnosis not present

## 2019-01-17 DIAGNOSIS — M9904 Segmental and somatic dysfunction of sacral region: Secondary | ICD-10-CM | POA: Diagnosis not present

## 2019-01-17 DIAGNOSIS — M5136 Other intervertebral disc degeneration, lumbar region: Secondary | ICD-10-CM | POA: Diagnosis not present

## 2019-01-19 DIAGNOSIS — M5136 Other intervertebral disc degeneration, lumbar region: Secondary | ICD-10-CM | POA: Diagnosis not present

## 2019-01-19 DIAGNOSIS — M5441 Lumbago with sciatica, right side: Secondary | ICD-10-CM | POA: Diagnosis not present

## 2019-01-19 DIAGNOSIS — M9903 Segmental and somatic dysfunction of lumbar region: Secondary | ICD-10-CM | POA: Diagnosis not present

## 2019-01-19 DIAGNOSIS — M5137 Other intervertebral disc degeneration, lumbosacral region: Secondary | ICD-10-CM | POA: Diagnosis not present

## 2019-01-19 DIAGNOSIS — M9904 Segmental and somatic dysfunction of sacral region: Secondary | ICD-10-CM | POA: Diagnosis not present

## 2019-01-22 DIAGNOSIS — M5137 Other intervertebral disc degeneration, lumbosacral region: Secondary | ICD-10-CM | POA: Diagnosis not present

## 2019-01-22 DIAGNOSIS — M5441 Lumbago with sciatica, right side: Secondary | ICD-10-CM | POA: Diagnosis not present

## 2019-01-22 DIAGNOSIS — M5136 Other intervertebral disc degeneration, lumbar region: Secondary | ICD-10-CM | POA: Diagnosis not present

## 2019-01-22 DIAGNOSIS — M9903 Segmental and somatic dysfunction of lumbar region: Secondary | ICD-10-CM | POA: Diagnosis not present

## 2019-01-22 DIAGNOSIS — M9904 Segmental and somatic dysfunction of sacral region: Secondary | ICD-10-CM | POA: Diagnosis not present

## 2019-01-29 DIAGNOSIS — M9904 Segmental and somatic dysfunction of sacral region: Secondary | ICD-10-CM | POA: Diagnosis not present

## 2019-01-29 DIAGNOSIS — M5136 Other intervertebral disc degeneration, lumbar region: Secondary | ICD-10-CM | POA: Diagnosis not present

## 2019-01-29 DIAGNOSIS — M9903 Segmental and somatic dysfunction of lumbar region: Secondary | ICD-10-CM | POA: Diagnosis not present

## 2019-01-29 DIAGNOSIS — M5441 Lumbago with sciatica, right side: Secondary | ICD-10-CM | POA: Diagnosis not present

## 2019-01-29 DIAGNOSIS — M5137 Other intervertebral disc degeneration, lumbosacral region: Secondary | ICD-10-CM | POA: Diagnosis not present

## 2019-01-30 ENCOUNTER — Other Ambulatory Visit: Payer: Self-pay | Admitting: Internal Medicine

## 2019-02-05 DIAGNOSIS — M5441 Lumbago with sciatica, right side: Secondary | ICD-10-CM | POA: Diagnosis not present

## 2019-02-05 DIAGNOSIS — M9903 Segmental and somatic dysfunction of lumbar region: Secondary | ICD-10-CM | POA: Diagnosis not present

## 2019-02-05 DIAGNOSIS — M9904 Segmental and somatic dysfunction of sacral region: Secondary | ICD-10-CM | POA: Diagnosis not present

## 2019-02-05 DIAGNOSIS — M5137 Other intervertebral disc degeneration, lumbosacral region: Secondary | ICD-10-CM | POA: Diagnosis not present

## 2019-02-05 DIAGNOSIS — M5136 Other intervertebral disc degeneration, lumbar region: Secondary | ICD-10-CM | POA: Diagnosis not present

## 2019-02-06 DIAGNOSIS — L720 Epidermal cyst: Secondary | ICD-10-CM | POA: Diagnosis not present

## 2019-02-06 DIAGNOSIS — D1801 Hemangioma of skin and subcutaneous tissue: Secondary | ICD-10-CM | POA: Diagnosis not present

## 2019-02-06 DIAGNOSIS — Z85828 Personal history of other malignant neoplasm of skin: Secondary | ICD-10-CM | POA: Diagnosis not present

## 2019-02-06 DIAGNOSIS — L821 Other seborrheic keratosis: Secondary | ICD-10-CM | POA: Diagnosis not present

## 2019-02-15 DIAGNOSIS — U071 COVID-19: Secondary | ICD-10-CM | POA: Diagnosis not present

## 2019-02-17 ENCOUNTER — Other Ambulatory Visit: Payer: Self-pay

## 2019-02-17 ENCOUNTER — Emergency Department (HOSPITAL_BASED_OUTPATIENT_CLINIC_OR_DEPARTMENT_OTHER): Payer: Medicare HMO

## 2019-02-17 ENCOUNTER — Encounter (HOSPITAL_BASED_OUTPATIENT_CLINIC_OR_DEPARTMENT_OTHER): Payer: Self-pay | Admitting: Emergency Medicine

## 2019-02-17 ENCOUNTER — Emergency Department (HOSPITAL_BASED_OUTPATIENT_CLINIC_OR_DEPARTMENT_OTHER)
Admission: EM | Admit: 2019-02-17 | Discharge: 2019-02-17 | Disposition: A | Discharge status: Home / Self Care | Payer: Medicare HMO | Attending: Emergency Medicine | Admitting: Emergency Medicine

## 2019-02-17 DIAGNOSIS — R079 Chest pain, unspecified: Secondary | ICD-10-CM | POA: Diagnosis not present

## 2019-02-17 DIAGNOSIS — I10 Essential (primary) hypertension: Secondary | ICD-10-CM | POA: Diagnosis not present

## 2019-02-17 DIAGNOSIS — Z79899 Other long term (current) drug therapy: Secondary | ICD-10-CM | POA: Insufficient documentation

## 2019-02-17 DIAGNOSIS — M549 Dorsalgia, unspecified: Secondary | ICD-10-CM | POA: Insufficient documentation

## 2019-02-17 DIAGNOSIS — Z96619 Presence of unspecified artificial shoulder joint: Secondary | ICD-10-CM | POA: Diagnosis not present

## 2019-02-17 DIAGNOSIS — R0602 Shortness of breath: Secondary | ICD-10-CM | POA: Diagnosis not present

## 2019-02-17 DIAGNOSIS — R05 Cough: Secondary | ICD-10-CM

## 2019-02-17 DIAGNOSIS — R059 Cough, unspecified: Secondary | ICD-10-CM

## 2019-02-17 DIAGNOSIS — R0789 Other chest pain: Secondary | ICD-10-CM | POA: Diagnosis not present

## 2019-02-17 LAB — BASIC METABOLIC PANEL
Anion gap: 8 (ref 5–15)
BUN: 21 mg/dL (ref 8–23)
CO2: 26 mmol/L (ref 22–32)
Calcium: 8.5 mg/dL — ABNORMAL LOW (ref 8.9–10.3)
Chloride: 104 mmol/L (ref 98–111)
Creatinine, Ser: 0.56 mg/dL — ABNORMAL LOW (ref 0.61–1.24)
GFR calc Af Amer: >60 mL/min (ref >60)
GFR calc non Af Amer: >60 mL/min (ref >60)
Glucose, Bld: 117 mg/dL — ABNORMAL HIGH (ref 70–99)
Potassium: 3.8 mmol/L (ref 3.5–5.1)
Sodium: 138 mmol/L (ref 135–145)

## 2019-02-17 LAB — CBC WITH DIFFERENTIAL/PLATELET
Abs Immature Granulocytes: 0.01 10*3/uL (ref 0.00–0.07)
Basophils Absolute: 0 10*3/uL (ref 0.0–0.1)
Basophils Relative: 0 %
Eosinophils Absolute: 0 10*3/uL (ref 0.0–0.5)
Eosinophils Relative: 1 %
HCT: 41.8 % (ref 39.0–52.0)
Hemoglobin: 13.8 g/dL (ref 13.0–17.0)
Immature Granulocytes: 0 %
Lymphocytes Relative: 25 %
Lymphs Abs: 0.9 10*3/uL (ref 0.7–4.0)
MCH: 30.9 pg (ref 26.0–34.0)
MCHC: 33 g/dL (ref 30.0–36.0)
MCV: 93.5 fL (ref 80.0–100.0)
Monocytes Absolute: 0.2 10*3/uL (ref 0.1–1.0)
Monocytes Relative: 6 %
Neutro Abs: 2.5 10*3/uL (ref 1.7–7.7)
Neutrophils Relative %: 68 %
Platelets: 132 10*3/uL — ABNORMAL LOW (ref 150–400)
RBC: 4.47 MIL/uL (ref 4.22–5.81)
RDW: 12.8 % (ref 11.5–15.5)
WBC: 3.6 10*3/uL — ABNORMAL LOW (ref 4.0–10.5)
nRBC: 0 % (ref 0.0–0.2)

## 2019-02-17 LAB — TROPONIN I (HIGH SENSITIVITY): Troponin I (High Sensitivity): 6 ng/L (ref <18)

## 2019-02-17 LAB — D-DIMER, QUANTITATIVE: D-Dimer, Quant: 0.35 ug/mL-FEU (ref 0.00–0.50)

## 2019-02-17 MED ORDER — CYCLOBENZAPRINE HCL 5 MG PO TABS: 10.0000 mg | ORAL_TABLET | Freq: Two times a day (BID) | ORAL | 0 refills | Status: DC | PRN

## 2019-02-17 MED ORDER — FENTANYL CITRATE (PF) 100 MCG/2ML IJ SOLN: 50.0000 ug | Freq: Once | INTRAMUSCULAR | Status: DC

## 2019-02-17 MED ORDER — ACETAMINOPHEN 325 MG PO TABS
650.0000 mg | ORAL_TABLET | Freq: Once | ORAL | Status: AC
  Administered 2019-02-17: 650 mg via ORAL
  Filled 2019-02-17: qty 2

## 2019-02-17 NOTE — ED Triage Notes (Signed)
Pt states that he was r/o for COVID earlier this week because of chest " aching" to his left ache. He denies any other s/s of COVID - he states that he was put on an antibiotic to help with the aching but he continues to have the ache and he would like to " know why"

## 2019-02-17 NOTE — ED Provider Notes (Signed)
[facility] EMERGENCY DEPARTMENT Provider Note   CSN: [id number] Arrival date & time: 02/17/19  1037     History Chief Complaint  Patient presents with  . Cough    r/o COVID    Mark Jordan is a 79 y.o. male.  The history is provided by the patient.  Cough Cough characteristics:  Non-productive Sputum characteristics:  Nondescript Severity:  Mild Onset quality:  Gradual Timing:  Intermittent Progression:  Waxing and waning Chronicity:  New Smoker: no   Context: upper respiratory infection   Relieved by:  Nothing Worsened by:  Nothing Associated symptoms: chest pain (right sided)   Associated symptoms: no chills, no ear pain, no fever, no rash, no shortness of breath and no sore throat   Risk factors: no recent infection        Past Medical History:  Diagnosis Date  . Allergic rhinitis   . Anxiety   . Chronic low back pain   . Diverticulosis of colon   . GERD (gastroesophageal reflux disease)   . HTN (hypertension)   . Hypercholesterolemia   . Kidney calculus   . PUD (peptic ulcer disease)     Patient Active Problem List   Diagnosis Date Noted  . Arm paresthesia, left 08/22/2018  . Chronic left flank pain 05/30/2018  . Other microscopic hematuria 05/30/2018  . Routine general medical examination at a health care facility 03/24/2017  . Arthralgia of right acromioclavicular joint 04/20/2016  . Lumbar radicular pain 10/08/2015  . Cervical radiculitis 10/08/2015  . GAD (generalized anxiety disorder) 07/03/2015  . Prediabetes 11/01/2014  . Insomnia, persistent 10/29/2013  . 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 DJD (degenerative joint disease) 02/21/2012  . Hyperlipidemia with target LDL less than 100 11/22/2006  . Essential hypertension 11/22/2006  . ALLERGIC RHINITIS 11/22/2006  . GERD 11/22/2006    Past Surgical History:  Procedure Laterality Date  . COLONOSCOPY   2014  . INGUINAL HERNIA REPAIR     left  . LEG SURGERY    . ROTATOR CUFF REPAIR  5/08   left ; Dr Onnie Graham  . TOTAL SHOULDER ARTHROPLASTY  8/11   after fall w/ prox humerus fx       Family History  Problem Relation Age of Onset  . Heart failure Father   . Pneumonia Mother   . Diabetes Neg Hx   . Stroke Neg Hx   . Heart attack Neg Hx     Social History   Tobacco Use  . Smoking status: Never Smoker  . Smokeless tobacco: Never Used  Substance Use Topics  . Alcohol use: No  . Drug use: No    Home Medications Prior to Admission medications   Medication Sig Start Date End Date Taking? Authorizing Provider  ALPRAZolam (XANAX) 0.5 MG tablet TAKE 1/2-1 TABLET BY MOUTH ONCE DAILY ATBEDTIME AS NEEDED ONLY. 12/05/18   Janith Lima, MD  amLODipine-benazepril (LOTREL) 10-20 MG capsule TAKE 1 CAPSULE BY MOUTH ONCE DAILY. 01/31/19   Janith Lima, MD  cyclobenzaprine (FLEXERIL) 5 MG tablet Take 2 tablets (10 mg total) by mouth 2 (two) times daily as needed for up to 10 doses for muscle spasms. 02/17/19   Yonathan Perrow, DO  Doxepin HCl (SILENOR) 3 MG TABS Take 1 tablet (3 mg total) by mouth at bedtime as needed. 03/17/16   Janith Lima, MD  finasteride (PROSCAR) 5 MG tablet Take by mouth. 01/08/19 04/08/19  [provider]  gabapentin (NEURONTIN) 300 MG capsule TAKE ONE CAPSULE BY MOUTH AT BEDTIME 10/11/17   Janith Lima, MD  Omega-3 Fatty Acids (FISH OIL) 1000 MG CPDR Take 1,000 mcg by mouth daily.    [provider]  omeprazole (PRILOSEC) 40 MG capsule TAKE ONE CAPSULE BY MOUTH DAILY 09/13/18   Janith Lima, MD  rosuvastatin (CRESTOR) 10 MG tablet TAKE ONE (1) TABLET ONCE DAILY 12/12/18   Janith Lima, MD  Tamsulosin HCl (FLOMAX) 0.4 MG CAPS Take 0.4 mg by mouth daily after supper.  12/01/10   [provider]    Allergies    Patient has no known allergies.  Review of Systems   Review of Systems  Constitutional: Negative for chills and fever.    HENT: Negative for ear pain and sore throat.   Eyes: Negative for pain and visual disturbance.  Respiratory: Positive for cough. Negative for shortness of breath.   Cardiovascular: Positive for chest pain (right sided). Negative for palpitations.  Gastrointestinal: Negative for abdominal pain and vomiting.  Genitourinary: Negative for dysuria and hematuria.  Musculoskeletal: Negative for arthralgias and back pain.  Skin: Negative for color change and rash.  Neurological: Negative for seizures and syncope.  All other systems reviewed and are negative.   Physical Exam Updated Vital Signs BP 133/81   Pulse (!) 55   Temp 99.1 F (37.3 C) (Oral)   Resp (!) 25   Ht 5\' 8"  (1.727 m)   Wt 69.4 kg   SpO2 94%   BMI 23.26 kg/m   Physical Exam Vitals and nursing note reviewed.  Constitutional:      Appearance: He is well-developed.  HENT:     Head: Normocephalic and atraumatic.     Nose: Nose normal.     Mouth/Throat:     Mouth: Mucous membranes are moist.  Eyes:     Extraocular Movements: Extraocular movements intact.     Conjunctiva/sclera: Conjunctivae normal.     Pupils: Pupils are equal, round, and reactive to light.  Cardiovascular:     Rate and Rhythm: Normal rate and regular rhythm.     Pulses: Normal pulses.     Heart sounds: Normal heart sounds. No murmur.  Pulmonary:     Effort: Pulmonary effort is normal. No respiratory distress.     Breath sounds: Normal breath sounds.  Abdominal:     Palpations: Abdomen is soft.     Tenderness: There is no abdominal tenderness.  Musculoskeletal:        General: Tenderness (TTP to right subscapular area) present.     Cervical back: Normal range of motion and neck supple.  Skin:    General: Skin is warm and dry.     Capillary Refill: Capillary refill takes less than 2 seconds.  Neurological:     General: No focal deficit present.     Mental Status: He is alert.  Psychiatric:        Mood and Affect: Mood normal.     ED  Results / Procedures / Treatments   Labs (all labs ordered are listed, but only abnormal results are displayed) Labs Reviewed  CBC WITH DIFFERENTIAL/PLATELET - Abnormal; Notable for the following components:      Result Value   WBC 3.6 (*)    Platelets 132 (*)    All other components within normal limits  BASIC METABOLIC PANEL - Abnormal; Notable for the following components:   Glucose, Bld 117 (*)    Creatinine, Ser 0.56 (*)  Calcium 8.5 (*)    All other components within normal limits  D-DIMER, QUANTITATIVE (NOT AT [facility])  TROPONIN I (HIGH SENSITIVITY)    EKG EKG Interpretation  Date/Time:  Saturday February 17 2019 10:58:08 EST Ventricular Rate:  72 PR Interval:    QRS Duration: 83 QT Interval:  384 QTC Calculation: 421 R Axis:   37 Text Interpretation: Sinus rhythm Abnormal R-wave progression, early transition Abnormal inferior Q waves Confirmed by Lennice Sites [phone]) on 02/17/2019 11:42:21 AM   Radiology DG Chest Portable 1 View  Result Date: 02/17/2019 CLINICAL DATA:  79 year old male with history of cough for 1 week. Shortness of breath. EXAM: PORTABLE CHEST 1 VIEW COMPARISON:  Chest x-Lynette 09/30/2018. FINDINGS: Lung volumes are normal. No consolidative airspace disease. No pleural effusions. No evidence of pulmonary edema. Heart size is normal. The patient is rotated to the right on today's exam, resulting in distortion of the mediastinal contours and reduced diagnostic sensitivity and specificity for mediastinal pathology. Atherosclerotic calcifications in the thoracic aorta. Left shoulder arthroplasty. IMPRESSION: 1. No radiographic evidence of acute cardiopulmonary disease. 2. Aortic atherosclerosis. Electronically Signed   By: Vinnie Langton M.D.   On: 02/17/2019 11:38    Procedures Procedures (including critical care time)  Medications Ordered in ED Medications  acetaminophen (TYLENOL) tablet 650 mg (650 mg Oral Given 02/17/19 1329)    ED Course  I have  reviewed the triage vital signs and the nursing notes.  Pertinent labs & imaging results that were available during my care of the patient were reviewed by me and considered in my medical decision making (see chart for details).    MDM Rules/Calculators/A&P  Quinterrius Amaurie Grigas is an 79 year old male with history of hypertension, high cholesterol who presents to the ED with chest pain, back pain.  Patient with cough for the last several days.  Awaiting a Covid test.  Has been on antibiotics for possible lung infection.  Was recently seen outpatient facility.  Continues to have persistent right-sided pain.  Denies any shortness of breath.  Has mostly right-sided symptoms.  Possibly has some reproducible tenderness in the subscapular area on the right.  Possibly MSK type pain.  EKG shows sinus rhythm.  No ischemic changes.  However will order troponin, D-dimer as concern for possible PE.  Patient otherwise does not have cardiac sounding chest pain.  Covid test has not resulted yet.  But does not have any fevers or chills.  Chest x-Donovyn done today shows no acute findings.  No infectious findings.  Overall clear x-Natale.  D-dimer is negative and doubt PE.  Troponin normal.  No cardiac sounding chest pain.  Doubt ACS.  No significant anemia, electrolyte abnormality, kidney injury otherwise.  Overall possibly musculoskeletal versus viral process.  Recommend continued self-isolation until Covid test is back.  Given return precautions and discharged from ED in good condition.  Will prescribe muscle relaxant.  This chart was dictated using voice recognition software.  Despite best efforts to proofread,  errors can occur which can change the documentation meaning.     Final Clinical Impression(s) / ED Diagnoses Final diagnoses:  Back pain, unspecified back location, unspecified back pain laterality, unspecified chronicity  Cough    Rx / DC Orders ED Discharge Orders         Ordered    cyclobenzaprine  (FLEXERIL) 5 MG tablet  2 times daily PRN     02/17/19 1332           Toran Murch, Quita Skye, DO 02/17/19 1333

## 2019-03-01 DIAGNOSIS — M5137 Other intervertebral disc degeneration, lumbosacral region: Secondary | ICD-10-CM | POA: Diagnosis not present

## 2019-03-01 DIAGNOSIS — M9904 Segmental and somatic dysfunction of sacral region: Secondary | ICD-10-CM | POA: Diagnosis not present

## 2019-03-01 DIAGNOSIS — M5136 Other intervertebral disc degeneration, lumbar region: Secondary | ICD-10-CM | POA: Diagnosis not present

## 2019-03-01 DIAGNOSIS — M5441 Lumbago with sciatica, right side: Secondary | ICD-10-CM | POA: Diagnosis not present

## 2019-03-01 DIAGNOSIS — M9903 Segmental and somatic dysfunction of lumbar region: Secondary | ICD-10-CM | POA: Diagnosis not present

## 2019-03-02 DIAGNOSIS — M5136 Other intervertebral disc degeneration, lumbar region: Secondary | ICD-10-CM | POA: Diagnosis not present

## 2019-03-02 DIAGNOSIS — M5137 Other intervertebral disc degeneration, lumbosacral region: Secondary | ICD-10-CM | POA: Diagnosis not present

## 2019-03-02 DIAGNOSIS — M5441 Lumbago with sciatica, right side: Secondary | ICD-10-CM | POA: Diagnosis not present

## 2019-03-02 DIAGNOSIS — M9903 Segmental and somatic dysfunction of lumbar region: Secondary | ICD-10-CM | POA: Diagnosis not present

## 2019-03-02 DIAGNOSIS — M9904 Segmental and somatic dysfunction of sacral region: Secondary | ICD-10-CM | POA: Diagnosis not present

## 2019-03-05 ENCOUNTER — Ambulatory Visit: Payer: Medicare HMO | Admitting: Internal Medicine

## 2019-03-05 DIAGNOSIS — M9903 Segmental and somatic dysfunction of lumbar region: Secondary | ICD-10-CM | POA: Diagnosis not present

## 2019-03-05 DIAGNOSIS — M9904 Segmental and somatic dysfunction of sacral region: Secondary | ICD-10-CM | POA: Diagnosis not present

## 2019-03-05 DIAGNOSIS — M5441 Lumbago with sciatica, right side: Secondary | ICD-10-CM | POA: Diagnosis not present

## 2019-03-05 DIAGNOSIS — M5136 Other intervertebral disc degeneration, lumbar region: Secondary | ICD-10-CM | POA: Diagnosis not present

## 2019-03-05 DIAGNOSIS — M5137 Other intervertebral disc degeneration, lumbosacral region: Secondary | ICD-10-CM | POA: Diagnosis not present

## 2019-03-07 DIAGNOSIS — M5136 Other intervertebral disc degeneration, lumbar region: Secondary | ICD-10-CM | POA: Diagnosis not present

## 2019-03-07 DIAGNOSIS — M9904 Segmental and somatic dysfunction of sacral region: Secondary | ICD-10-CM | POA: Diagnosis not present

## 2019-03-07 DIAGNOSIS — M9903 Segmental and somatic dysfunction of lumbar region: Secondary | ICD-10-CM | POA: Diagnosis not present

## 2019-03-07 DIAGNOSIS — M5137 Other intervertebral disc degeneration, lumbosacral region: Secondary | ICD-10-CM | POA: Diagnosis not present

## 2019-03-07 DIAGNOSIS — M5441 Lumbago with sciatica, right side: Secondary | ICD-10-CM | POA: Diagnosis not present

## 2019-03-08 ENCOUNTER — Ambulatory Visit (INDEPENDENT_AMBULATORY_CARE_PROVIDER_SITE_OTHER): Payer: Medicare HMO

## 2019-03-08 ENCOUNTER — Encounter: Payer: Self-pay | Admitting: Internal Medicine

## 2019-03-08 ENCOUNTER — Other Ambulatory Visit: Payer: Self-pay

## 2019-03-08 ENCOUNTER — Ambulatory Visit (INDEPENDENT_AMBULATORY_CARE_PROVIDER_SITE_OTHER): Payer: Medicare HMO | Admitting: Internal Medicine

## 2019-03-08 VITALS — BP 140/80 | HR 66 | Temp 97.9°F | Resp 16 | Ht 68.0 in | Wt 150.0 lb

## 2019-03-08 DIAGNOSIS — M546 Pain in thoracic spine: Secondary | ICD-10-CM | POA: Diagnosis not present

## 2019-03-08 DIAGNOSIS — M542 Cervicalgia: Secondary | ICD-10-CM

## 2019-03-08 DIAGNOSIS — U071 COVID-19: Secondary | ICD-10-CM | POA: Diagnosis not present

## 2019-03-08 DIAGNOSIS — R7303 Prediabetes: Secondary | ICD-10-CM | POA: Diagnosis not present

## 2019-03-08 DIAGNOSIS — R059 Cough, unspecified: Secondary | ICD-10-CM

## 2019-03-08 DIAGNOSIS — Z8616 Personal history of COVID-19: Secondary | ICD-10-CM

## 2019-03-08 DIAGNOSIS — M4803 Spinal stenosis, cervicothoracic region: Secondary | ICD-10-CM | POA: Diagnosis not present

## 2019-03-08 DIAGNOSIS — R05 Cough: Secondary | ICD-10-CM | POA: Diagnosis not present

## 2019-03-08 DIAGNOSIS — G8929 Other chronic pain: Secondary | ICD-10-CM | POA: Diagnosis not present

## 2019-03-08 DIAGNOSIS — M50323 Other cervical disc degeneration at C6-C7 level: Secondary | ICD-10-CM | POA: Diagnosis not present

## 2019-03-08 DIAGNOSIS — D696 Thrombocytopenia, unspecified: Secondary | ICD-10-CM | POA: Insufficient documentation

## 2019-03-08 LAB — CBC WITH DIFFERENTIAL/PLATELET
Basophils Absolute: 0 10*3/uL (ref 0.0–0.1)
Basophils Relative: 0.7 % (ref 0.0–3.0)
Eosinophils Absolute: 0.1 10*3/uL (ref 0.0–0.7)
Eosinophils Relative: 2.2 % (ref 0.0–5.0)
HCT: 42.5 % (ref 39.0–52.0)
Hemoglobin: 14 g/dL (ref 13.0–17.0)
Lymphocytes Relative: 32.1 % (ref 12.0–46.0)
Lymphs Abs: 1.7 10*3/uL (ref 0.7–4.0)
MCHC: 32.9 g/dL (ref 30.0–36.0)
MCV: 93.2 fl (ref 78.0–100.0)
Monocytes Absolute: 0.5 10*3/uL (ref 0.1–1.0)
Monocytes Relative: 9.1 % (ref 3.0–12.0)
Neutro Abs: 3 10*3/uL (ref 1.4–7.7)
Neutrophils Relative %: 55.9 % (ref 43.0–77.0)
Platelets: 257 10*3/uL (ref 150.0–400.0)
RBC: 4.56 Mil/uL (ref 4.22–5.81)
RDW: 13.7 % (ref 11.5–15.5)
WBC: 5.3 10*3/uL (ref 4.0–10.5)

## 2019-03-08 LAB — BASIC METABOLIC PANEL
BUN: 21 mg/dL (ref 6–23)
CO2: 29 mEq/L (ref 19–32)
Calcium: 9.5 mg/dL (ref 8.4–10.5)
Chloride: 105 mEq/L (ref 96–112)
Creatinine, Ser: 0.67 mg/dL (ref 0.40–1.50)
GFR: 113.96 mL/min (ref >60.00)
Glucose, Bld: 102 mg/dL — ABNORMAL HIGH (ref 70–99)
Potassium: 4.1 mEq/L (ref 3.5–5.1)
Sodium: 140 mEq/L (ref 135–145)

## 2019-03-08 LAB — VITAMIN B12: Vitamin B-12: 354 pg/mL (ref 211–911)

## 2019-03-08 LAB — HEMOGLOBIN A1C: Hgb A1c MFr Bld: 6.3 % (ref 4.6–6.5)

## 2019-03-08 LAB — FOLATE: Folate: >24.1 ng/mL (ref >5.9)

## 2019-03-08 NOTE — Progress Notes (Signed)
Subjective:  Patient ID: Mark Jordan, male    DOB: September 30, 1940  Age: 79 y.o. MRN: [id number]  CC: Back Pain and Cough  This visit occurred during the SARS-CoV-2 public health emergency.  Safety protocols were in place, including screening questions prior to the visit, additional usage of staff PPE, and extensive cleaning of exam room while observing appropriate contact time as indicated for disinfecting solutions.    HPI Mark Jordan presents for multiple complaints.  He tells me that a few weeks ago he was seen elsewhere and diagnosed with COVID-19 infection.  His only lingering symptom is a mild nonproductive cough.  He denies chest pain, shortness of breath, fever, chills, myalgias, or fatigue.  He also complains of a several month history of pain in his upper back/lower neck that radiates towards his right upper extremity and he has numbness and weakness in his right upper extremity.  He denies any trauma or injury.  He tells me he is getting symptom relief with over-the-counter remedies.     Outpatient Medications Prior to Visit  Medication Sig Dispense Refill  . ALPRAZolam (XANAX) 0.5 MG tablet TAKE 1/2-1 TABLET BY MOUTH ONCE DAILY ATBEDTIME AS NEEDED ONLY. 30 tablet 5  . amLODipine-benazepril (LOTREL) 10-20 MG capsule TAKE 1 CAPSULE BY MOUTH ONCE DAILY. 90 capsule 0  . cyclobenzaprine (FLEXERIL) 5 MG tablet Take 2 tablets (10 mg total) by mouth 2 (two) times daily as needed for up to 10 doses for muscle spasms. 10 tablet 0  . finasteride (PROSCAR) 5 MG tablet Take by mouth.    . gabapentin (NEURONTIN) 300 MG capsule TAKE ONE CAPSULE BY MOUTH AT BEDTIME 30 capsule 3  . Omega-3 Fatty Acids (FISH OIL) 1000 MG CPDR Take 1,000 mcg by mouth daily.    Marland Kitchen omeprazole (PRILOSEC) 40 MG capsule TAKE ONE CAPSULE BY MOUTH DAILY 90 capsule 1  . rosuvastatin (CRESTOR) 10 MG tablet TAKE ONE (1) TABLET ONCE DAILY 90 tablet 1  . Tamsulosin HCl (FLOMAX) 0.4 MG CAPS Take 0.4 mg by mouth daily after  supper.     . Doxepin HCl (SILENOR) 3 MG TABS Take 1 tablet (3 mg total) by mouth at bedtime as needed. (Patient not taking: Reported on 03/08/2019) 90 tablet 3   No facility-administered medications prior to visit.    ROS Review of Systems  Constitutional: Negative for chills, diaphoresis, fatigue and fever.  HENT: Negative.  Negative for sore throat and trouble swallowing.   Respiratory: Positive for cough. Negative for chest tightness, shortness of breath and wheezing.   Cardiovascular: Negative for chest pain, palpitations and leg swelling.  Gastrointestinal: Negative for abdominal pain, constipation, diarrhea, nausea and vomiting.  Endocrine: Negative.   Genitourinary: Negative.  Negative for difficulty urinating and hematuria.  Musculoskeletal: Positive for neck pain.  Skin: Negative.  Negative for rash.  Neurological: Positive for weakness and numbness. Negative for dizziness, light-headedness and headaches.  Hematological: Negative for adenopathy. Does not bruise/bleed easily.  Psychiatric/Behavioral: Negative.     Objective:  BP 140/80 (BP Location: Left Arm, Patient Position: Sitting, Cuff Size: Normal)   Pulse 66   Temp 97.9 F (36.6 C) (Oral)   Resp 16   Ht 5\' 8"  (1.727 m)   Wt 150 lb (68 kg)   SpO2 96%   BMI 22.81 kg/m   BP Readings from Last 3 Encounters:  03/08/19 140/80  02/17/19 133/81  09/30/18 (!) 157/94    Wt Readings from Last 3 Encounters:  03/08/19 150 lb (68  kg)  02/17/19 153 lb (69.4 kg)  09/30/18 155 lb (70.3 kg)    Physical Exam Vitals reviewed.  Constitutional:      Appearance: Normal appearance.  HENT:     Nose: Nose normal.     Mouth/Throat:     Mouth: Mucous membranes are moist.  Eyes:     General: No scleral icterus.    Conjunctiva/sclera: Conjunctivae normal.  Cardiovascular:     Rate and Rhythm: Normal rate and regular rhythm.     Heart sounds: No murmur.  Pulmonary:     Effort: Pulmonary effort is normal.     Breath  sounds: No stridor. No wheezing or rhonchi.  Abdominal:     General: Abdomen is flat. Bowel sounds are normal. There is no distension.     Palpations: Abdomen is soft. There is no hepatomegaly or splenomegaly.     Tenderness: There is no abdominal tenderness.  Musculoskeletal:        General: Normal range of motion.     Cervical back: Neck supple. Tenderness present. No edema or deformity.     Thoracic back: Tenderness present.     Right lower leg: No edema.     Left lower leg: No edema.     Comments: There is mild diffuse tenderness over the lower cervical, upper thoracic spine.  Lymphadenopathy:     Cervical: No cervical adenopathy.  Skin:    General: Skin is warm and dry.     Coloration: Skin is not pale.  Neurological:     Mental Status: He is alert and oriented to person, place, and time. Mental status is at baseline.     Motor: No weakness.     Deep Tendon Reflexes: Reflexes normal.     Lab Results  Component Value Date   WBC 5.3 03/08/2019   HGB 14.0 03/08/2019   HCT 42.5 03/08/2019   PLT 257.0 03/08/2019   GLUCOSE 102 (H) 03/08/2019   CHOL 186 05/30/2018   TRIG 48.0 05/30/2018   HDL 37.50 (L) 05/30/2018   LDLDIRECT 144.2 02/07/2013   LDLCALC 139 (H) 05/30/2018   ALT 14 05/30/2018   AST 15 05/30/2018   NA 140 03/08/2019   K 4.1 03/08/2019   CL 105 03/08/2019   CREATININE 0.67 03/08/2019   BUN 21 03/08/2019   CO2 29 03/08/2019   TSH 2.20 05/30/2018   PSA 0.97 05/30/2018   INR 0.97 09/10/2009   HGBA1C 6.3 03/08/2019    DG Chest Portable 1 View  Result Date: 02/17/2019 CLINICAL DATA:  79 year old male with history of cough for 1 week. Shortness of breath. EXAM: PORTABLE CHEST 1 VIEW COMPARISON:  Chest x-Jarmaine 09/30/2018. FINDINGS: Lung volumes are normal. No consolidative airspace disease. No pleural effusions. No evidence of pulmonary edema. Heart size is normal. The patient is rotated to the right on today's exam, resulting in distortion of the mediastinal  contours and reduced diagnostic sensitivity and specificity for mediastinal pathology. Atherosclerotic calcifications in the thoracic aorta. Left shoulder arthroplasty. IMPRESSION: 1. No radiographic evidence of acute cardiopulmonary disease. 2. Aortic atherosclerosis. Electronically Signed   By: Vinnie Langton M.D.   On: 02/17/2019 11:38   DG Chest 2 View  Result Date: 03/08/2019 CLINICAL DATA:  Cough. EXAM: CHEST - 2 VIEW COMPARISON:  February 17, 2019. FINDINGS: The heart size and mediastinal contours are within normal limits. Both lungs are clear. The visualized skeletal structures are unremarkable. IMPRESSION: No active cardiopulmonary disease. Electronically Signed   By: Bobbe Medico.D.  On: 03/08/2019 14:09   DG Cervical Spine Complete  Result Date: 03/08/2019 CLINICAL DATA:  Neck pain for 1 month without known injury. EXAM: CERVICAL SPINE - COMPLETE 4+ VIEW COMPARISON:  February 21, 2012. FINDINGS: No fracture or significant spondylolisthesis is noted. Moderate degenerative disc disease is noted at C6-7 and C7-T1 with anterior osteophyte formation. Hypertrophy of bilateral facet joints is noted, right greater than left. Mild bilateral neural foraminal stenosis is noted at C6-7 secondary to uncovertebral spurring. IMPRESSION: Moderate multilevel degenerative disc disease. Mild bilateral neural foraminal stenosis is noted at C6-7 secondary to uncovertebral spurring. Electronically Signed   By: Marijo Conception M.D.   On: 03/08/2019 14:13   DG Thoracic Spine W/Swimmers  Result Date: 03/08/2019 CLINICAL DATA:  Thoracic back pain for 1 month without known injury. EXAM: THORACIC SPINE - 3 VIEWS COMPARISON:  None. FINDINGS: There is no evidence of thoracic spine fracture. Alignment is normal. No other significant bone abnormalities are identified. IMPRESSION: Negative. Electronically Signed   By: Marijo Conception M.D.   On: 03/08/2019 14:14     Assessment & Plan:   Garth was seen today for back  pain and cough.  Diagnoses and all orders for this visit:  Chronic right-sided thoracic back pain -     DG Thoracic Spine W/Swimmers; Future  Neck pain, chronic -     DG Cervical Spine Complete; Future  Cough- His chest x-Lamario is normal.  He is recovering nicely from the recent COVID-19 infection. 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  I recommended that he see neurosurgery to consider treatment options. -     Ambulatory referral to Neurosurgery  Thrombocytopenia ([place])- His platelet count is normal now and his B12 and folate levels are normal. -     CBC with Differential/Platelet -     Vitamin B12 -     Folate  Prediabetes- His A1c is at 6.3%.  Medical therapy is not indicated. -     Hemoglobin A1c -     Basic metabolic panel   I am having Trixie Deis maintain his tamsulosin, Fish Oil, Doxepin HCl, gabapentin, omeprazole, ALPRAZolam, rosuvastatin, finasteride, amLODipine-benazepril, and cyclobenzaprine.  No orders of the defined types were placed in this encounter.    Follow-up: Return in about 3 weeks (around 03/29/2019).  Scarlette Calico, MD

## 2019-03-08 NOTE — Patient Instructions (Signed)

## 2019-03-09 ENCOUNTER — Encounter: Payer: Self-pay | Admitting: Internal Medicine

## 2019-03-09 DIAGNOSIS — M5136 Other intervertebral disc degeneration, lumbar region: Secondary | ICD-10-CM | POA: Diagnosis not present

## 2019-03-09 DIAGNOSIS — M9903 Segmental and somatic dysfunction of lumbar region: Secondary | ICD-10-CM | POA: Diagnosis not present

## 2019-03-09 DIAGNOSIS — M5137 Other intervertebral disc degeneration, lumbosacral region: Secondary | ICD-10-CM | POA: Diagnosis not present

## 2019-03-09 DIAGNOSIS — M5441 Lumbago with sciatica, right side: Secondary | ICD-10-CM | POA: Diagnosis not present

## 2019-03-09 DIAGNOSIS — M9904 Segmental and somatic dysfunction of sacral region: Secondary | ICD-10-CM | POA: Diagnosis not present

## 2019-03-12 DIAGNOSIS — M5441 Lumbago with sciatica, right side: Secondary | ICD-10-CM | POA: Diagnosis not present

## 2019-03-12 DIAGNOSIS — M9904 Segmental and somatic dysfunction of sacral region: Secondary | ICD-10-CM | POA: Diagnosis not present

## 2019-03-12 DIAGNOSIS — M5137 Other intervertebral disc degeneration, lumbosacral region: Secondary | ICD-10-CM | POA: Diagnosis not present

## 2019-03-12 DIAGNOSIS — M9903 Segmental and somatic dysfunction of lumbar region: Secondary | ICD-10-CM | POA: Diagnosis not present

## 2019-03-12 DIAGNOSIS — M5136 Other intervertebral disc degeneration, lumbar region: Secondary | ICD-10-CM | POA: Diagnosis not present

## 2019-03-14 ENCOUNTER — Other Ambulatory Visit: Payer: Self-pay | Admitting: Internal Medicine

## 2019-03-14 DIAGNOSIS — K2100 Gastro-esophageal reflux disease with esophagitis, without bleeding: Secondary | ICD-10-CM

## 2019-03-16 DIAGNOSIS — M9904 Segmental and somatic dysfunction of sacral region: Secondary | ICD-10-CM | POA: Diagnosis not present

## 2019-03-16 DIAGNOSIS — M9903 Segmental and somatic dysfunction of lumbar region: Secondary | ICD-10-CM | POA: Diagnosis not present

## 2019-03-16 DIAGNOSIS — M5136 Other intervertebral disc degeneration, lumbar region: Secondary | ICD-10-CM | POA: Diagnosis not present

## 2019-03-16 DIAGNOSIS — M5441 Lumbago with sciatica, right side: Secondary | ICD-10-CM | POA: Diagnosis not present

## 2019-03-16 DIAGNOSIS — M5137 Other intervertebral disc degeneration, lumbosacral region: Secondary | ICD-10-CM | POA: Diagnosis not present

## 2019-03-19 DIAGNOSIS — R29898 Other symptoms and signs involving the musculoskeletal system: Secondary | ICD-10-CM | POA: Diagnosis not present

## 2019-03-23 DIAGNOSIS — M25511 Pain in right shoulder: Secondary | ICD-10-CM | POA: Diagnosis not present

## 2019-03-23 DIAGNOSIS — M9903 Segmental and somatic dysfunction of lumbar region: Secondary | ICD-10-CM | POA: Diagnosis not present

## 2019-03-23 DIAGNOSIS — M9904 Segmental and somatic dysfunction of sacral region: Secondary | ICD-10-CM | POA: Diagnosis not present

## 2019-03-23 DIAGNOSIS — M5137 Other intervertebral disc degeneration, lumbosacral region: Secondary | ICD-10-CM | POA: Diagnosis not present

## 2019-03-23 DIAGNOSIS — M5136 Other intervertebral disc degeneration, lumbar region: Secondary | ICD-10-CM | POA: Diagnosis not present

## 2019-03-23 DIAGNOSIS — M5441 Lumbago with sciatica, right side: Secondary | ICD-10-CM | POA: Diagnosis not present

## 2019-03-27 DIAGNOSIS — R29898 Other symptoms and signs involving the musculoskeletal system: Secondary | ICD-10-CM | POA: Diagnosis not present

## 2019-03-27 DIAGNOSIS — M7541 Impingement syndrome of right shoulder: Secondary | ICD-10-CM | POA: Diagnosis not present

## 2019-04-03 ENCOUNTER — Other Ambulatory Visit: Payer: Self-pay | Admitting: Internal Medicine

## 2019-04-03 DIAGNOSIS — F411 Generalized anxiety disorder: Secondary | ICD-10-CM

## 2019-04-09 DIAGNOSIS — G540 Brachial plexus disorders: Secondary | ICD-10-CM | POA: Diagnosis not present

## 2019-04-09 DIAGNOSIS — R29898 Other symptoms and signs involving the musculoskeletal system: Secondary | ICD-10-CM | POA: Diagnosis not present

## 2019-04-30 ENCOUNTER — Other Ambulatory Visit: Payer: Self-pay | Admitting: Internal Medicine

## 2019-05-14 DIAGNOSIS — N35012 Post-traumatic membranous urethral stricture: Secondary | ICD-10-CM | POA: Diagnosis not present

## 2019-05-14 DIAGNOSIS — N281 Cyst of kidney, acquired: Secondary | ICD-10-CM | POA: Diagnosis not present

## 2019-05-14 DIAGNOSIS — N323 Diverticulum of bladder: Secondary | ICD-10-CM | POA: Diagnosis not present

## 2019-05-14 DIAGNOSIS — N200 Calculus of kidney: Secondary | ICD-10-CM | POA: Diagnosis not present

## 2019-05-14 DIAGNOSIS — N400 Enlarged prostate without lower urinary tract symptoms: Secondary | ICD-10-CM | POA: Diagnosis not present

## 2019-06-11 DIAGNOSIS — N281 Cyst of kidney, acquired: Secondary | ICD-10-CM | POA: Diagnosis not present

## 2019-06-11 DIAGNOSIS — N35012 Post-traumatic membranous urethral stricture: Secondary | ICD-10-CM | POA: Diagnosis not present

## 2019-06-11 DIAGNOSIS — N200 Calculus of kidney: Secondary | ICD-10-CM | POA: Diagnosis not present

## 2019-06-11 DIAGNOSIS — N400 Enlarged prostate without lower urinary tract symptoms: Secondary | ICD-10-CM | POA: Diagnosis not present

## 2019-06-11 DIAGNOSIS — R339 Retention of urine, unspecified: Secondary | ICD-10-CM | POA: Diagnosis not present

## 2019-06-11 DIAGNOSIS — N323 Diverticulum of bladder: Secondary | ICD-10-CM | POA: Diagnosis not present

## 2019-06-14 ENCOUNTER — Other Ambulatory Visit: Payer: Self-pay | Admitting: Internal Medicine

## 2019-06-14 DIAGNOSIS — E785 Hyperlipidemia, unspecified: Secondary | ICD-10-CM

## 2019-06-25 ENCOUNTER — Other Ambulatory Visit: Payer: Self-pay | Admitting: Internal Medicine

## 2019-06-25 DIAGNOSIS — F411 Generalized anxiety disorder: Secondary | ICD-10-CM

## 2019-07-12 ENCOUNTER — Encounter: Payer: Self-pay | Admitting: Sports Medicine

## 2019-07-12 ENCOUNTER — Other Ambulatory Visit: Payer: Self-pay

## 2019-07-12 ENCOUNTER — Ambulatory Visit: Payer: Medicare HMO | Admitting: Sports Medicine

## 2019-07-12 DIAGNOSIS — I739 Peripheral vascular disease, unspecified: Secondary | ICD-10-CM | POA: Diagnosis not present

## 2019-07-12 DIAGNOSIS — M2042 Other hammer toe(s) (acquired), left foot: Secondary | ICD-10-CM

## 2019-07-12 DIAGNOSIS — L84 Corns and callosities: Secondary | ICD-10-CM | POA: Diagnosis not present

## 2019-07-12 DIAGNOSIS — M79675 Pain in left toe(s): Secondary | ICD-10-CM

## 2019-07-12 NOTE — Progress Notes (Signed)
Subjective: Mark Jordan is a 79 y.o. male patient who returns to office for follow-up evaluation of Left foot pain secondary to callus skin at the 3rd toe and ball of foot. Patient reports that last trimmed helped for about 6 months. No other issues noted.   Patient Active Problem List   Diagnosis Date Noted  . Chronic right-sided thoracic back pain 03/08/2019  . Neck pain, chronic 03/08/2019  . Cough 03/08/2019  . COVID-19 virus infection 03/08/2019  . Spinal stenosis of cervicothoracic region 03/08/2019  . Thrombocytopenia ([place]) 03/08/2019  . Arm paresthesia, left 08/22/2018  . Chronic left flank pain 05/30/2018  . Other microscopic hematuria 05/30/2018  . History of adenomatous polyp of colon 07/15/2017  . Routine general medical examination at a health care facility 03/24/2017  . Arthralgia of right acromioclavicular joint 04/20/2016  . Lumbar radicular pain 10/08/2015  . Cervical radiculitis 10/08/2015  . Hammer toe of second toe of left foot 08/12/2015  . GAD (generalized anxiety disorder) 07/03/2015  . Prediabetes 11/01/2014  . Insomnia, persistent 10/29/2013  . Drusen, retina, bilateral 09/03/2013  . Senile nuclear sclerosis, bilateral 09/03/2013  . 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 DJD (degenerative joint disease) 02/21/2012  . Hyperlipidemia with target LDL less than 100 11/22/2006  . Essential hypertension 11/22/2006  . ALLERGIC RHINITIS 11/22/2006  . GERD 11/22/2006    Current Outpatient Medications on File Prior to Visit  Medication Sig Dispense Refill  . finasteride (PROSCAR) 5 MG tablet TAKE ONE (1) TABLET ONCE DAILY    . ALPRAZolam (XANAX) 0.5 MG tablet TAKE 1/2-1 TABLET BY MOUTH ONCE DAILY ATBEDTIME AS NEEDED ONLY. 30 tablet 3  . amLODipine-benazepril (LOTREL) 10-20 MG capsule TAKE 1 CAPSULE BY MOUTH ONCE DAILY. 90 capsule 0  . cyclobenzaprine (FLEXERIL) 5 MG tablet Take 2 tablets (10  mg total) by mouth 2 (two) times daily as needed for up to 10 doses for muscle spasms. 10 tablet 0  . Doxepin HCl (SILENOR) 3 MG TABS Take 1 tablet (3 mg total) by mouth at bedtime as needed. (Patient not taking: Reported on 03/08/2019) 90 tablet 3  . gabapentin (NEURONTIN) 300 MG capsule TAKE ONE CAPSULE BY MOUTH AT BEDTIME 30 capsule 3  . Omega-3 Fatty Acids (FISH OIL) 1000 MG CPDR Take 1,000 mcg by mouth daily.    Marland Kitchen omeprazole (PRILOSEC) 40 MG capsule TAKE ONE CAPSULE BY MOUTH DAILY 90 capsule 1  . rosuvastatin (CRESTOR) 10 MG tablet TAKE ONE (1) TABLET ONCE DAILY 90 tablet 0  . Tamsulosin HCl (FLOMAX) 0.4 MG CAPS Take 0.4 mg by mouth daily after supper.      No current facility-administered medications on file prior to visit.    No Known Allergies  Objective:  General: Alert and oriented x3 in no acute distress  Dermatology: Keratotic lesion present lateral 3rd toe and sub met 4 on left with skin lines transversing the lesion, pain is present with direct pressure to the lesion with a central nucleated core noted, no webspace macerations, no ecchymosis bilateral, surgical scars well healed, all nails x 10 are thick but well manicured.  Vascular: Dorsalis Pedis and Posterior Tibial pedal pulses 1/4, Capillary Fill Time 3 seconds, + pedal hair growth bilateral, varicositises and trace edema bilateral lower extremities, Temperature gradient within normal limits.  Neurology: Johney Maine sensation intact via light touch bilateral.  Musculoskeletal: Mild tenderness with palpation at the keratotic lesion site on left sub met 4 and 3rd  toe with crossover hammertoe deformity as previously noted, Muscular strength 4/5 in all groups without pain or limitation on range of motion. + Bunion and hammertoe lower extremity muscular or boney deformity noted.  Assessment and Plan: Problem List Items Addressed This Visit    None    Visit Diagnoses    Corns and callosities    -  Primary   Hammertoe of left foot        Toe pain, left       PVD (peripheral vascular disease) ([place])         -Complete examination performed -Patient declined a new set of x-rays at today's visit -Discussed treatment options for painful corn at left third toe with hammertoe deformity -Mechanically debrided keratosis x2 plantar left forefoot and left third toe using a sterile chisel blade without incident -Dispensed dancer pad and applied to the left shoe -Advised good supportive shoes and inserts daily -Continue with daily skin emollients and foot miracle cream -Patient to return to office as scheduled in 6 months for routine callus care.  Landis Martins, DPM

## 2019-07-26 DIAGNOSIS — H2513 Age-related nuclear cataract, bilateral: Secondary | ICD-10-CM | POA: Diagnosis not present

## 2019-08-09 ENCOUNTER — Encounter: Payer: Self-pay | Admitting: Internal Medicine

## 2019-08-09 ENCOUNTER — Ambulatory Visit (INDEPENDENT_AMBULATORY_CARE_PROVIDER_SITE_OTHER): Payer: Medicare HMO | Admitting: Internal Medicine

## 2019-08-09 ENCOUNTER — Other Ambulatory Visit: Payer: Self-pay

## 2019-08-09 ENCOUNTER — Ambulatory Visit (INDEPENDENT_AMBULATORY_CARE_PROVIDER_SITE_OTHER): Payer: Medicare HMO

## 2019-08-09 VITALS — BP 122/78 | HR 59 | Temp 98.2°F | Ht 68.0 in | Wt 155.0 lb

## 2019-08-09 DIAGNOSIS — Z0000 Encounter for general adult medical examination without abnormal findings: Secondary | ICD-10-CM | POA: Diagnosis not present

## 2019-08-09 DIAGNOSIS — R0789 Other chest pain: Secondary | ICD-10-CM

## 2019-08-09 DIAGNOSIS — E785 Hyperlipidemia, unspecified: Secondary | ICD-10-CM | POA: Diagnosis not present

## 2019-08-09 DIAGNOSIS — K2100 Gastro-esophageal reflux disease with esophagitis, without bleeding: Secondary | ICD-10-CM

## 2019-08-09 DIAGNOSIS — R079 Chest pain, unspecified: Secondary | ICD-10-CM

## 2019-08-09 DIAGNOSIS — I10 Essential (primary) hypertension: Secondary | ICD-10-CM

## 2019-08-09 DIAGNOSIS — D696 Thrombocytopenia, unspecified: Secondary | ICD-10-CM | POA: Diagnosis not present

## 2019-08-09 DIAGNOSIS — R7303 Prediabetes: Secondary | ICD-10-CM | POA: Diagnosis not present

## 2019-08-09 MED ORDER — DEXILANT 60 MG PO CPDR: 60.0000 mg | DELAYED_RELEASE_CAPSULE | Freq: Every day | ORAL | 1 refills | Status: DC

## 2019-08-09 NOTE — Patient Instructions (Signed)

## 2019-08-09 NOTE — Progress Notes (Signed)
Subjective:  Patient ID: Mark Jordan, male    DOB: 31-Oct-1940  Age: 79 y.o. MRN: [id number]  CC: Annual Exam  This visit occurred during the SARS-CoV-2 public health emergency.  Safety protocols were in place, including screening questions prior to the visit, additional usage of staff PPE, and extensive cleaning of exam room while observing appropriate contact time as indicated for disinfecting solutions.    HPI [name] presents for a CPX.  He complains of a 22-month history of burning sensation over his left chest and upper abdomen area.  He has heartburn but he denies odynophagia or dysphagia.  The pain is not exertional.  He denies DOE, cough, hemoptysis, loss of appetite, weight loss, dysuria, hematuria, rash, or diaphoresis.  He saw a urologist about 6 weeks ago and the dipstick of the urine was negative for blood.  Outpatient Medications Prior to Visit  Medication Sig Dispense Refill  . ALPRAZolam (XANAX) 0.5 MG tablet TAKE 1/2-1 TABLET BY MOUTH ONCE DAILY ATBEDTIME AS NEEDED ONLY. 30 tablet 3  . amLODipine-benazepril (LOTREL) 10-20 MG capsule TAKE 1 CAPSULE BY MOUTH ONCE DAILY. 90 capsule 0  . cyclobenzaprine (FLEXERIL) 5 MG tablet Take 2 tablets (10 mg total) by mouth 2 (two) times daily as needed for up to 10 doses for muscle spasms. 10 tablet 0  . Doxepin HCl (SILENOR) 3 MG TABS Take 1 tablet (3 mg total) by mouth at bedtime as needed. 90 tablet 3  . finasteride (PROSCAR) 5 MG tablet TAKE ONE (1) TABLET ONCE DAILY    . gabapentin (NEURONTIN) 300 MG capsule TAKE ONE CAPSULE BY MOUTH AT BEDTIME 30 capsule 3  . Omega-3 Fatty Acids (FISH OIL) 1000 MG CPDR Take 1,000 mcg by mouth daily.    . rosuvastatin (CRESTOR) 10 MG tablet TAKE ONE (1) TABLET ONCE DAILY 90 tablet 0  . Tamsulosin HCl (FLOMAX) 0.4 MG CAPS Take 0.4 mg by mouth daily after supper.     Marland Kitchen omeprazole (PRILOSEC) 40 MG capsule TAKE ONE CAPSULE BY MOUTH DAILY 90 capsule 1   No facility-administered medications  prior to visit.    ROS Review of Systems  Constitutional: Positive for unexpected weight change (wt gain). Negative for appetite change, chills, diaphoresis and fatigue.  HENT: Negative.  Negative for trouble swallowing and voice change.   Eyes: Negative for visual disturbance.  Respiratory: Negative for cough, chest tightness, shortness of breath and wheezing.   Cardiovascular: Positive for chest pain. Negative for palpitations and leg swelling.  Gastrointestinal: Positive for abdominal pain. Negative for abdominal distention, blood in stool, constipation, diarrhea, nausea and vomiting.  Genitourinary: Positive for flank pain. Negative for difficulty urinating, dysuria and hematuria.  Musculoskeletal: Negative for arthralgias, back pain, myalgias and neck pain.  Skin: Negative.  Negative for color change and pallor.  Neurological: Negative for dizziness, weakness, light-headedness, numbness and headaches.  Hematological: Negative for adenopathy. Does not bruise/bleed easily.  Psychiatric/Behavioral: Negative.     Objective:  BP 122/78 (BP Location: Left Arm, Patient Position: Sitting, Cuff Size: Normal)   Pulse (!) 59   Temp 98.2 F (36.8 C) (Oral)   Ht 5\' 8"  (1.727 m)   Wt 155 lb (70.3 kg)   SpO2 95%   BMI 23.57 kg/m   BP Readings from Last 3 Encounters:  08/09/19 122/78  03/08/19 140/80  02/17/19 133/81    Wt Readings from Last 3 Encounters:  08/09/19 155 lb (70.3 kg)  03/08/19 150 lb (68 kg)  02/17/19 153 lb (69.4  kg)    Physical Exam Vitals reviewed.  Constitutional:      Appearance: Normal appearance. He is not ill-appearing.  HENT:     Nose: Nose normal.     Mouth/Throat:     Mouth: Mucous membranes are moist.  Eyes:     General: No scleral icterus.    Conjunctiva/sclera: Conjunctivae normal.  Cardiovascular:     Rate and Rhythm: Normal rate and regular rhythm.     Heart sounds: No murmur heard.      Comments: EKG- Sinus bradycardia, 58 bpm Otherwise  normal EKG Pulmonary:     Effort: Pulmonary effort is normal.     Breath sounds: No stridor. No wheezing, rhonchi or rales.  Abdominal:     General: Abdomen is flat. Bowel sounds are normal. There is no distension.     Palpations: Abdomen is soft. There is no hepatomegaly, splenomegaly or mass.     Tenderness: There is no abdominal tenderness.     Hernia: No hernia is present.  Musculoskeletal:        General: No tenderness. Normal range of motion.     Cervical back: Neck supple.     Right lower leg: No edema.     Left lower leg: No edema.  Lymphadenopathy:     Cervical: No cervical adenopathy.  Neurological:     General: No focal deficit present.     Mental Status: He is alert and oriented to person, place, and time. Mental status is at baseline.  Psychiatric:        Mood and Affect: Mood normal.        Behavior: Behavior normal.        Thought Content: Thought content normal.     Lab Results  Component Value Date   WBC 5.6 08/09/2019   HGB 15.8 08/09/2019   HCT 47.5 08/09/2019   PLT 212 08/09/2019   GLUCOSE 101 (H) 08/09/2019   CHOL 207 (H) 08/09/2019   TRIG 72 08/09/2019   HDL 46 08/09/2019   LDLDIRECT 144.2 02/07/2013   LDLCALC 144 (H) 08/09/2019   ALT 19 08/09/2019   AST 17 08/09/2019   NA 141 08/09/2019   K 4.8 08/09/2019   CL 105 08/09/2019   CREATININE 0.75 08/09/2019   BUN 22 08/09/2019   CO2 31 08/09/2019   TSH 2.26 08/09/2019   PSA 0.97 05/30/2018   INR 0.97 09/10/2009   HGBA1C 5.9 (H) 08/09/2019    DG Chest Portable 1 View  Result Date: 02/17/2019 CLINICAL DATA:  79 year old male with history of cough for 1 week. Shortness of breath. EXAM: PORTABLE CHEST 1 VIEW COMPARISON:  Chest x-[place] 09/30/2018. FINDINGS: Lung volumes are normal. No consolidative airspace disease. No pleural effusions. No evidence of pulmonary edema. Heart size is normal. The patient is rotated to the right on today's exam, resulting in distortion of the mediastinal contours and  reduced diagnostic sensitivity and specificity for mediastinal pathology. Atherosclerotic calcifications in the thoracic aorta. Left shoulder arthroplasty. IMPRESSION: 1. No radiographic evidence of acute cardiopulmonary disease. 2. Aortic atherosclerosis. Electronically Signed   By: Vinnie Langton M.D.   On: 02/17/2019 11:38   DG Chest 2 View  Result Date: 08/09/2019 CLINICAL DATA:  Chest pain for 2 months EXAM: CHEST - 2 VIEW COMPARISON:  03/08/2019 FINDINGS: Cardiac shadow is within normal limits. Tortuous thoracic aorta is noted. The lungs are clear. Postsurgical changes in the left shoulder are noted. No acute bony abnormality is seen. IMPRESSION: No active cardiopulmonary disease. Electronically  Signed   By: Inez Catalina M.D.   On: 08/09/2019 23:19     Assessment & Plan:   Aidenn was seen today for annual exam.  Diagnoses and all orders for this visit:  Gastroesophageal reflux disease with esophagitis without hemorrhage- Will continue the PPI. -     Discontinue: dexlansoprazole (DEXILANT) 60 MG capsule; Take 1 capsule (60 mg total) by mouth daily. -     CBC with Differential/Platelet; Future -     CBC with Differential/Platelet -     dexlansoprazole (DEXILANT) 60 MG capsule; Take 1 capsule (60 mg total) by mouth daily.  Essential hypertension- His blood pressure is adequately well controlled. -     CBC with Differential/Platelet; Future -     Basic metabolic panel; Future -     TSH; Future -     TSH -     Basic metabolic panel -     CBC with Differential/Platelet  Hyperlipidemia with target LDL less than 100- He has achieved his LDL goal and is doing well on the statin. -     Lipid panel; Future -     TSH; Future -     Hepatic function panel; Future -     Hepatic function panel -     TSH -     Lipid panel  Prediabetes- His A1c is at 5.9%.  Medical therapy is not indicated. -     Basic metabolic panel; Future -     Hemoglobin A1c; Future -     Basic metabolic panel -      Hemoglobin A1c  Routine general medical examination at a health care facility- Exam completed, labs reviewed, vaccines reviewed and updated, no cancer screenings are indicated, patient education material was given.  Thrombocytopenia ([place])- His platelet count is normal now.  Chest pain at rest- See below. -     DG Chest 2 View; Future  Atypical chest pain- He has a 70-month history of vague discomfort.  He has a paucity of other symptoms.  His labs, EKG, and x-[place] are all reassuring.  I think this is either refractory GERD or musculoskeletal pain.  Will increase the PPI.  If the discomfort does not resolve soon then will consider doing a CT scan of the area. -     EKG 12-Lead   I have discontinued Lex V. Truluck's omeprazole. I am also having him maintain his tamsulosin, Fish Oil, Doxepin HCl, gabapentin, cyclobenzaprine, amLODipine-benazepril, rosuvastatin, ALPRAZolam, finasteride, and Dexilant.  Meds ordered this encounter  Medications  . DISCONTD: dexlansoprazole (DEXILANT) 60 MG capsule    Sig: Take 1 capsule (60 mg total) by mouth daily.    Dispense:  90 capsule    Refill:  1  . dexlansoprazole (DEXILANT) 60 MG capsule    Sig: Take 1 capsule (60 mg total) by mouth daily.    Dispense:  90 capsule    Refill:  1   In addition to time spent on CPE, I spent 50 minutes in preparing to see the patient by review of recent labs, imaging and procedures, obtaining and reviewing separately obtained history, communicating with the patient and family or caregiver, ordering medications, tests or procedures, and documenting clinical information in the EHR including the differential Dx, treatment, and any further evaluation and other management of 1. Gastroesophageal reflux disease with esophagitis without hemorrhage 2. Essential hypertension 3. Hyperlipidemia with target LDL less than 100 4. Prediabetes 5. Thrombocytopenia (HCC) 6. Chest pain at rest 7. Atypical chest pain  Follow-up: Return  in about 3 months (around 11/09/2019).  Scarlette Calico, MD

## 2019-08-10 ENCOUNTER — Telehealth: Payer: Self-pay | Admitting: Internal Medicine

## 2019-08-10 LAB — HEPATIC FUNCTION PANEL
AG Ratio: 1.8 (calc) (ref 1.0–2.5)
ALT: 19 U/L (ref 9–46)
AST: 17 U/L (ref 10–35)
Albumin: 4.6 g/dL (ref 3.6–5.1)
Alkaline phosphatase (APISO): 64 U/L (ref 35–144)
Bilirubin, Direct: 0.2 mg/dL (ref 0.0–0.2)
Globulin: 2.5 g/dL (calc) (ref 1.9–3.7)
Indirect Bilirubin: 0.6 mg/dL (calc) (ref 0.2–1.2)
Total Bilirubin: 0.8 mg/dL (ref 0.2–1.2)
Total Protein: 7.1 g/dL (ref 6.1–8.1)

## 2019-08-10 LAB — CBC WITH DIFFERENTIAL/PLATELET
Absolute Monocytes: 420 cells/uL (ref 200–950)
Basophils Absolute: 28 cells/uL (ref 0–200)
Basophils Relative: 0.5 %
Eosinophils Absolute: 129 cells/uL (ref 15–500)
Eosinophils Relative: 2.3 %
HCT: 47.5 % (ref 38.5–50.0)
Hemoglobin: 15.8 g/dL (ref 13.2–17.1)
Lymphs Abs: 1462 cells/uL (ref 850–3900)
MCH: 31 pg (ref 27.0–33.0)
MCHC: 33.3 g/dL (ref 32.0–36.0)
MCV: 93.3 fL (ref 80.0–100.0)
MPV: 11 fL (ref 7.5–12.5)
Monocytes Relative: 7.5 %
Neutro Abs: 3562 cells/uL (ref 1500–7800)
Neutrophils Relative %: 63.6 %
Platelets: 212 10*3/uL (ref 140–400)
RBC: 5.09 10*6/uL (ref 4.20–5.80)
RDW: 12.2 % (ref 11.0–15.0)
Total Lymphocyte: 26.1 %
WBC: 5.6 10*3/uL (ref 3.8–10.8)

## 2019-08-10 LAB — BASIC METABOLIC PANEL
BUN: 22 mg/dL (ref 7–25)
CO2: 31 mmol/L (ref 20–32)
Calcium: 9.5 mg/dL (ref 8.6–10.3)
Chloride: 105 mmol/L (ref 98–110)
Creat: 0.75 mg/dL (ref 0.70–1.11)
Glucose, Bld: 101 mg/dL — ABNORMAL HIGH (ref 65–99)
Potassium: 4.8 mmol/L (ref 3.5–5.3)
Sodium: 141 mmol/L (ref 135–146)

## 2019-08-10 LAB — HEMOGLOBIN A1C
Hgb A1c MFr Bld: 5.9 % of total Hgb — ABNORMAL HIGH (ref <5.7)
Mean Plasma Glucose: 123 (calc)
eAG (mmol/L): 6.8 (calc)

## 2019-08-10 LAB — LIPID PANEL
Cholesterol: 207 mg/dL — ABNORMAL HIGH (ref <200)
HDL: 46 mg/dL (ref >=40)
LDL Cholesterol (Calc): 144 mg/dL (calc) — ABNORMAL HIGH
Non-HDL Cholesterol (Calc): 161 mg/dL (calc) — ABNORMAL HIGH (ref <130)
Total CHOL/HDL Ratio: 4.5 (calc) (ref <5.0)
Triglycerides: 72 mg/dL (ref <150)

## 2019-08-10 LAB — TSH: TSH: 2.26 mIU/L (ref 0.40–4.50)

## 2019-08-10 NOTE — Telephone Encounter (Signed)
[facility] is out of stock of dexlansoprazole (DEXILANT) 60 MG capsule Please send to [id number] - [place], [place] [address]

## 2019-08-11 MED ORDER — DEXILANT 60 MG PO CPDR: 60.0000 mg | DELAYED_RELEASE_CAPSULE | Freq: Every day | ORAL | 1 refills | Status: DC

## 2019-08-20 ENCOUNTER — Other Ambulatory Visit: Payer: Self-pay | Admitting: Internal Medicine

## 2019-09-10 ENCOUNTER — Other Ambulatory Visit: Payer: Self-pay | Admitting: Internal Medicine

## 2019-09-14 DIAGNOSIS — N200 Calculus of kidney: Secondary | ICD-10-CM | POA: Diagnosis not present

## 2019-09-14 DIAGNOSIS — R339 Retention of urine, unspecified: Secondary | ICD-10-CM | POA: Diagnosis not present

## 2019-09-14 DIAGNOSIS — N323 Diverticulum of bladder: Secondary | ICD-10-CM | POA: Diagnosis not present

## 2019-09-14 DIAGNOSIS — N281 Cyst of kidney, acquired: Secondary | ICD-10-CM | POA: Diagnosis not present

## 2019-09-17 DIAGNOSIS — M7752 Other enthesopathy of left foot: Secondary | ICD-10-CM | POA: Diagnosis not present

## 2019-09-17 DIAGNOSIS — G5762 Lesion of plantar nerve, left lower limb: Secondary | ICD-10-CM | POA: Diagnosis not present

## 2019-09-17 DIAGNOSIS — N323 Diverticulum of bladder: Secondary | ICD-10-CM | POA: Diagnosis not present

## 2019-09-17 DIAGNOSIS — N200 Calculus of kidney: Secondary | ICD-10-CM | POA: Diagnosis not present

## 2019-09-17 DIAGNOSIS — B079 Viral wart, unspecified: Secondary | ICD-10-CM | POA: Diagnosis not present

## 2019-09-17 DIAGNOSIS — N400 Enlarged prostate without lower urinary tract symptoms: Secondary | ICD-10-CM | POA: Diagnosis not present

## 2019-09-17 DIAGNOSIS — N35012 Post-traumatic membranous urethral stricture: Secondary | ICD-10-CM | POA: Diagnosis not present

## 2019-09-17 DIAGNOSIS — M2042 Other hammer toe(s) (acquired), left foot: Secondary | ICD-10-CM | POA: Diagnosis not present

## 2019-09-17 DIAGNOSIS — N281 Cyst of kidney, acquired: Secondary | ICD-10-CM | POA: Diagnosis not present

## 2019-10-15 DIAGNOSIS — S00411A Abrasion of right ear, initial encounter: Secondary | ICD-10-CM | POA: Diagnosis not present

## 2019-10-15 DIAGNOSIS — H6121 Impacted cerumen, right ear: Secondary | ICD-10-CM | POA: Diagnosis not present

## 2019-10-17 DIAGNOSIS — G5762 Lesion of plantar nerve, left lower limb: Secondary | ICD-10-CM | POA: Diagnosis not present

## 2019-10-17 DIAGNOSIS — M7752 Other enthesopathy of left foot: Secondary | ICD-10-CM | POA: Diagnosis not present

## 2019-10-26 ENCOUNTER — Other Ambulatory Visit: Payer: Self-pay | Admitting: Internal Medicine

## 2019-10-26 DIAGNOSIS — F411 Generalized anxiety disorder: Secondary | ICD-10-CM

## 2019-11-01 DIAGNOSIS — G5762 Lesion of plantar nerve, left lower limb: Secondary | ICD-10-CM | POA: Diagnosis not present

## 2019-11-01 DIAGNOSIS — M7752 Other enthesopathy of left foot: Secondary | ICD-10-CM | POA: Diagnosis not present

## 2019-11-27 ENCOUNTER — Other Ambulatory Visit: Payer: Self-pay | Admitting: Internal Medicine

## 2019-11-27 DIAGNOSIS — F411 Generalized anxiety disorder: Secondary | ICD-10-CM

## 2019-12-03 DIAGNOSIS — G5762 Lesion of plantar nerve, left lower limb: Secondary | ICD-10-CM | POA: Diagnosis not present

## 2019-12-03 DIAGNOSIS — M7752 Other enthesopathy of left foot: Secondary | ICD-10-CM | POA: Diagnosis not present

## 2019-12-17 ENCOUNTER — Other Ambulatory Visit: Payer: Self-pay | Admitting: Internal Medicine

## 2020-01-08 ENCOUNTER — Telehealth: Payer: Self-pay | Admitting: Internal Medicine

## 2020-01-08 NOTE — Progress Notes (Signed)
  Chronic Care Management   Outreach Note  01/08/2020 Name: Mark Jordan MRN: [id number] DOB: 07-Jan-1941  Referred by: Janith Lima, MD Reason for referral : No chief complaint on file.   An unsuccessful telephone outreach was attempted today. The patient was referred to the pharmacist for assistance with care management and care coordination.   Follow Up Plan:   Carley Perdue [employer] Scheduler

## 2020-01-11 ENCOUNTER — Ambulatory Visit: Payer: Medicare HMO | Admitting: Sports Medicine

## 2020-01-15 ENCOUNTER — Telehealth: Payer: Self-pay | Admitting: Internal Medicine

## 2020-01-15 NOTE — Progress Notes (Signed)
  Chronic Care Management   Outreach Note  01/15/2020 Name: Mark Jordan MRN: [id number] DOB: [birth year]  Referred by: Janith Lima, MD Reason for referral : No chief complaint on file.   A second unsuccessful telephone outreach was attempted today. The patient was referred to pharmacist for assistance with care management and care coordination.  Follow Up Plan:   Carley Perdue [employer] Scheduler

## 2020-01-29 ENCOUNTER — Telehealth: Payer: Self-pay | Admitting: Internal Medicine

## 2020-01-29 NOTE — Progress Notes (Signed)
  Chronic Care Management   Note  01/29/2020 Name: Mark Jordan MRN: [id number] DOB: 1940-03-25  Mark Jordan is a 79 y.o. year old male who is a primary care patient of Etta Grandchild, MD. I reached out to Earlean Shawl by phone today in response to a referral sent by Mark Jordan's PCP, Etta Grandchild, MD.   [name] was given information about Chronic Care Management services today including:  1. CCM service includes personalized support from designated clinical staff supervised by his physician, including individualized plan of care and coordination with other care providers 2. 24/7 contact phone numbers for assistance for urgent and routine care needs. 3. Service will only be billed when office clinical staff spend 20 minutes or more in a month to coordinate care. 4. Only one practitioner may furnish and bill the service in a calendar month. 5. The patient may stop CCM services at any time (effective at the end of the month) by phone call to the office staff.   Patient agreed to services and verbal consent obtained.   Follow up plan:   Carley Perdue [employer] Scheduler

## 2020-02-03 IMAGING — DX DG CHEST 1V PORT
1 series · 1 of 1 positions shown · non-contrast
Comparison: Chest x-ray 09/30/2018.

CLINICAL DATA: 78-year-old male with history of cough for 1 week.
Shortness of breath.

EXAM:
PORTABLE CHEST 1 VIEW

[chest ap]
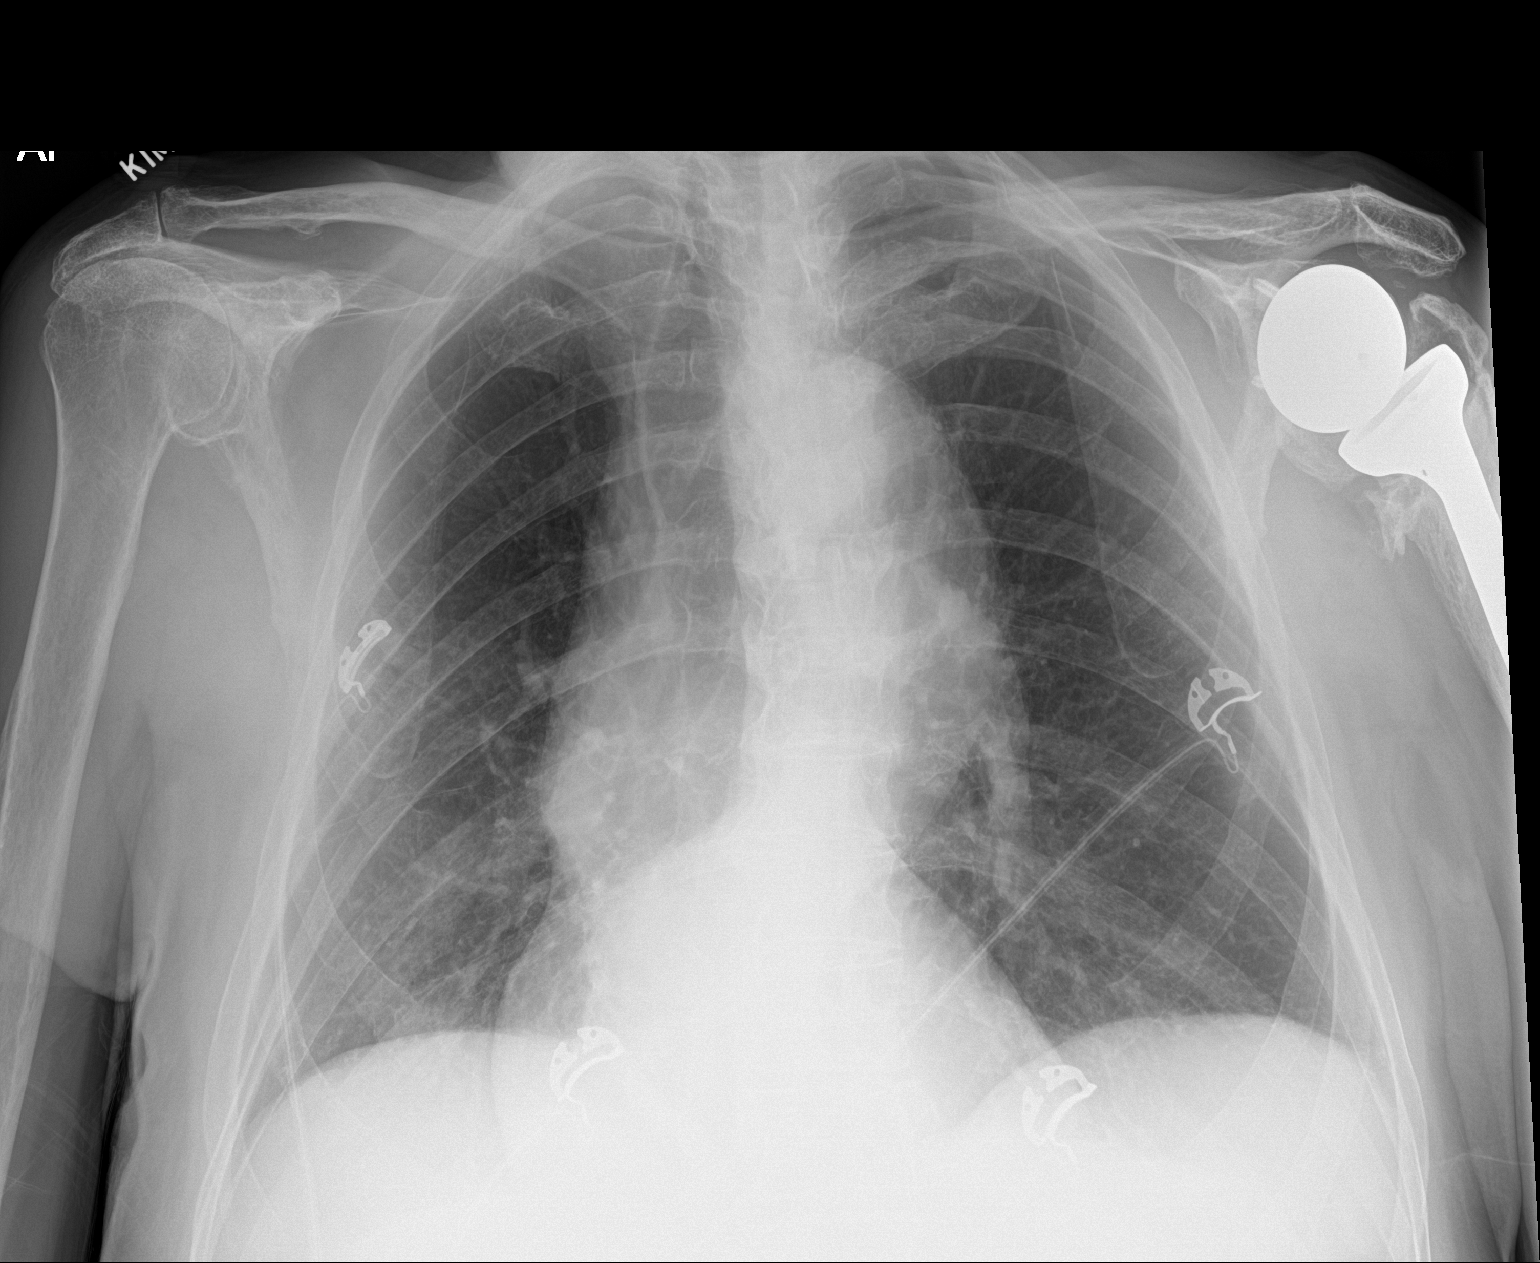

[1 of 1 positions shown; findings below may reference images not displayed]

FINDINGS: Lung volumes are normal. No consolidative airspace disease. No
pleural effusions. No evidence of pulmonary edema. Heart size is
normal. The patient is rotated to the right on today's exam,
resulting in distortion of the mediastinal contours and reduced
diagnostic sensitivity and specificity for mediastinal pathology.
Atherosclerotic calcifications in the thoracic aorta. Left shoulder
arthroplasty.
IMPRESSION: 1. No radiographic evidence of acute cardiopulmonary disease.
2. Aortic atherosclerosis.

## 2020-02-05 ENCOUNTER — Other Ambulatory Visit: Payer: Self-pay

## 2020-02-05 ENCOUNTER — Encounter: Payer: Self-pay | Admitting: Sports Medicine

## 2020-02-05 ENCOUNTER — Ambulatory Visit: Payer: Medicare HMO | Admitting: Sports Medicine

## 2020-02-05 DIAGNOSIS — L84 Corns and callosities: Secondary | ICD-10-CM | POA: Diagnosis not present

## 2020-02-05 DIAGNOSIS — M79675 Pain in left toe(s): Secondary | ICD-10-CM

## 2020-02-05 DIAGNOSIS — M2042 Other hammer toe(s) (acquired), left foot: Secondary | ICD-10-CM

## 2020-02-05 NOTE — Progress Notes (Signed)
Subjective: Mark Jordan is a 80 y.o. male patient who returns to office for follow-up evaluation of Left foot pain secondary to callus skin at   ball of foot. Patient reports that last trimmed helped but had a flare up and had a previous injection that helped. No other issues noted.   Patient Active Problem List   Diagnosis Date Noted  . Chest pain at rest 08/09/2019  . Chronic right-sided thoracic back pain 03/08/2019  . Spinal stenosis of cervicothoracic region 03/08/2019  . Arm paresthesia, left 08/22/2018  . Other microscopic hematuria 05/30/2018  . History of adenomatous polyp of colon 07/15/2017  . Routine general medical examination at a health care facility 03/24/2017  . Arthralgia of right acromioclavicular joint 04/20/2016  . Lumbar radicular pain 10/08/2015  . Cervical radiculitis 10/08/2015  . GAD (generalized anxiety disorder) 07/03/2015  . Prediabetes 11/01/2014  . Insomnia, persistent 10/29/2013  . Drusen, retina, bilateral 09/03/2013  . Senile nuclear sclerosis, bilateral 09/03/2013  . 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 DJD (degenerative joint disease) 02/21/2012  . Hyperlipidemia with target LDL less than 100 11/22/2006  . Essential hypertension 11/22/2006  . ALLERGIC RHINITIS 11/22/2006  . GERD 11/22/2006    Current Outpatient Medications on File Prior to Visit  Medication Sig Dispense Refill  . ALPRAZolam (XANAX) 0.5 MG tablet TAKE 1/2-1 TABLET BY MOUTH ONCE DAILY ATBEDTIME AS NEEDED ONLY. 30 tablet 3  . amLODipine-benazepril (LOTREL) 10-20 MG capsule TAKE 1 CAPSULE BY MOUTH ONCE DAILY. 90 capsule 0  . cyclobenzaprine (FLEXERIL) 5 MG tablet Take 2 tablets (10 mg total) by mouth 2 (two) times daily as needed for up to 10 doses for muscle spasms. 10 tablet 0  . dexlansoprazole (DEXILANT) 60 MG capsule Take 1 capsule (60 mg total) by mouth daily. 90 capsule 1  . Doxepin HCl (SILENOR) 3 MG  TABS Take 1 tablet (3 mg total) by mouth at bedtime as needed. 90 tablet 3  . finasteride (PROSCAR) 5 MG tablet TAKE ONE (1) TABLET ONCE DAILY    . gabapentin (NEURONTIN) 300 MG capsule TAKE ONE CAPSULE BY MOUTH AT BEDTIME 30 capsule 3  . Omega-3 Fatty Acids (FISH OIL) 1000 MG CPDR Take 1,000 mcg by mouth daily.    Marland Kitchen omeprazole (PRILOSEC) 40 MG capsule TAKE ONE (1) CAPSULE EACH DAY 90 capsule 1  . rosuvastatin (CRESTOR) 10 MG tablet TAKE ONE (1) TABLET ONCE DAILY 90 tablet 0  . Tamsulosin HCl (FLOMAX) 0.4 MG CAPS Take 0.4 mg by mouth daily after supper.      No current facility-administered medications on file prior to visit.    No Known Allergies  Objective:  General: Alert and oriented x3 in no acute distress  Dermatology: Keratotic lesion present sub met 4 on left with skin lines transversing the lesion, pain is present with direct pressure to the lesion with a central nucleated core noted, no webspace macerations, no ecchymosis bilateral, surgical scars well healed, all nails x 10 are thick but well manicured.  Vascular: Dorsalis Pedis and Posterior Tibial pedal pulses 1/4, Capillary Fill Time 3 seconds, + pedal hair growth bilateral, varicositises and trace edema bilateral lower extremities, Temperature gradient within normal limits.  Neurology: Johney Maine sensation intact via light touch bilateral.  Musculoskeletal: Minimal tenderness with palpation at the keratotic lesion site on left sub met 4 with crossover hammertoe deformity as previously noted, Muscular strength 4/5 in all groups without pain or limitation on range  of motion. + Bunion and hammertoe lower extremity muscular or boney deformity noted.  Assessment and Plan: Problem List Items Addressed This Visit   None   Visit Diagnoses    Corns and callosities    -  Primary   Hammertoe of left foot       Toe pain, left         -Complete examination performed -At no charged trimmed callus to left foot using 15 blade without  incident  -Continue foot miracle cream -Advised good supportive shoes -Return PRN.  Landis Martins, DPM

## 2020-02-22 IMAGING — DX DG THORACIC SPINE 3V
3 series · 3 of 3 positions shown · non-contrast
Comparison: None.

CLINICAL DATA: Thoracic back pain for 1 month without known injury.

EXAM:
THORACIC SPINE - 3 VIEWS

[thoracic spine ap]
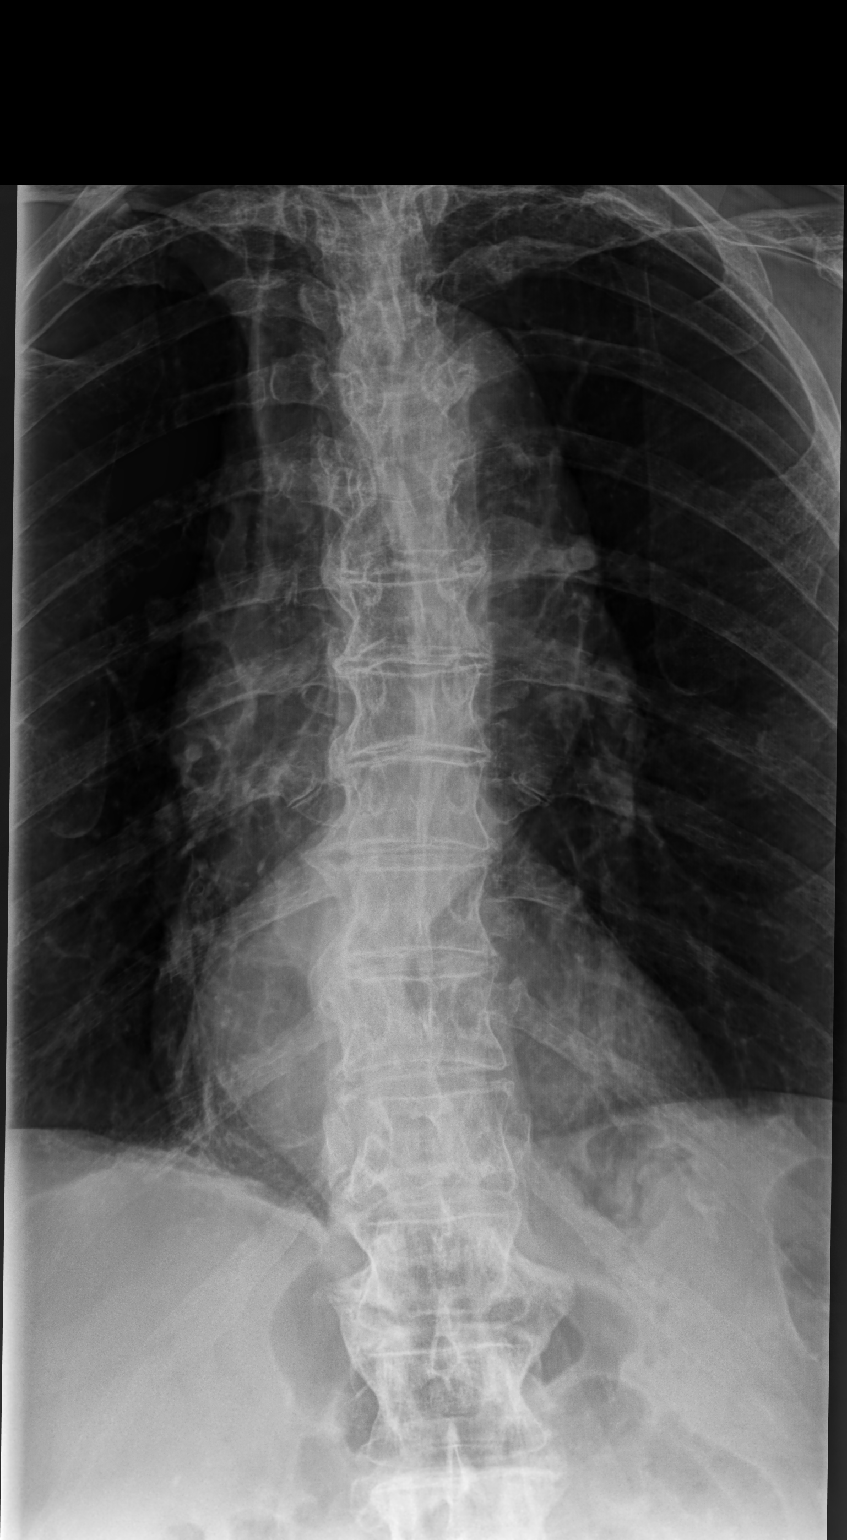

[thoracic spine standing lat]
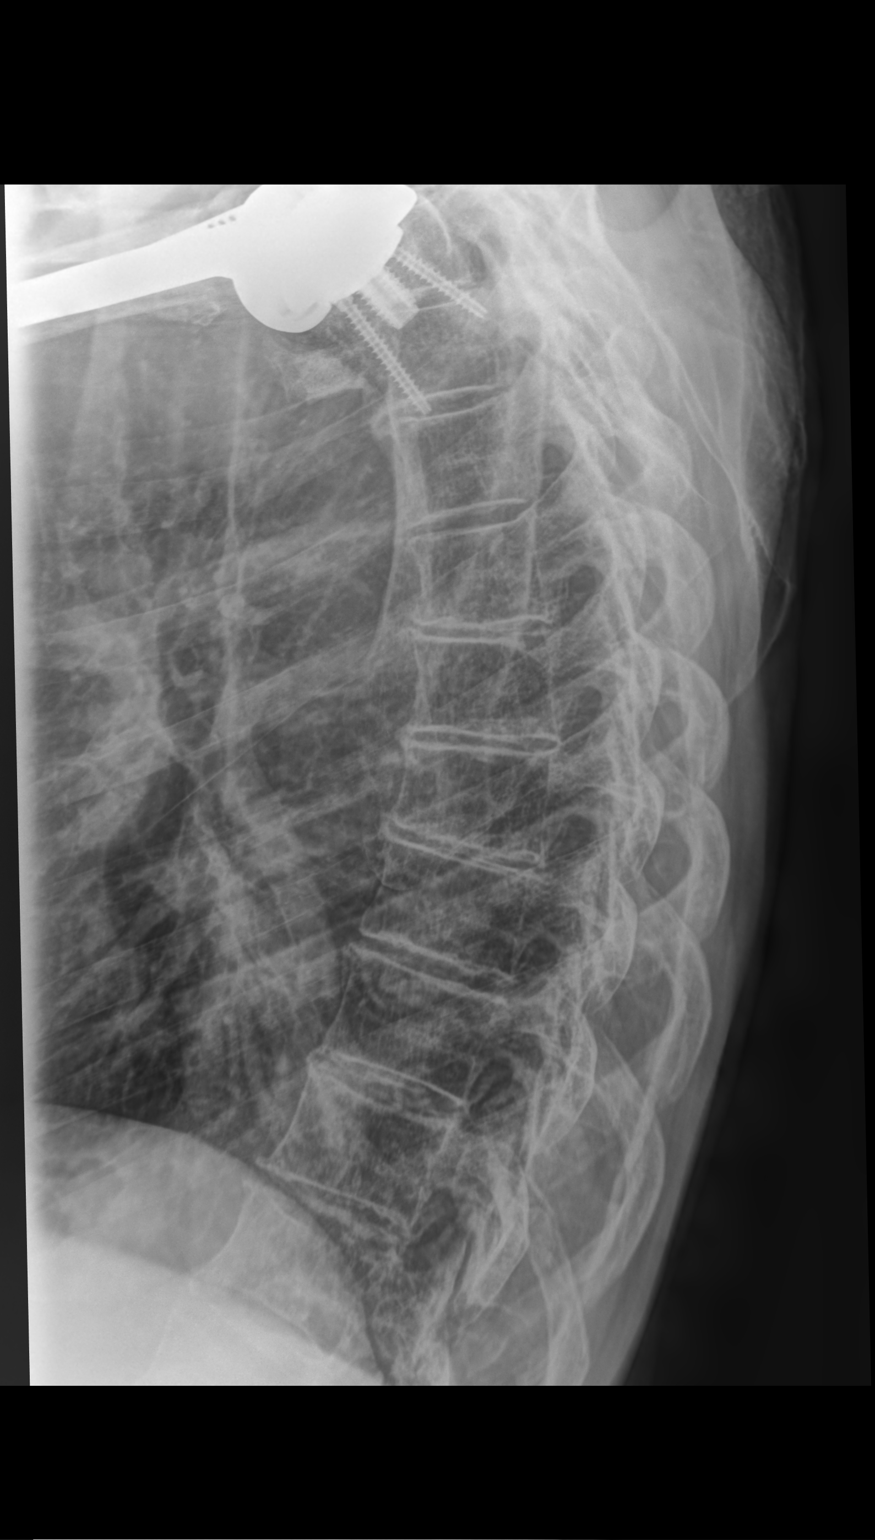

[cervical spine (swimmers) lat]
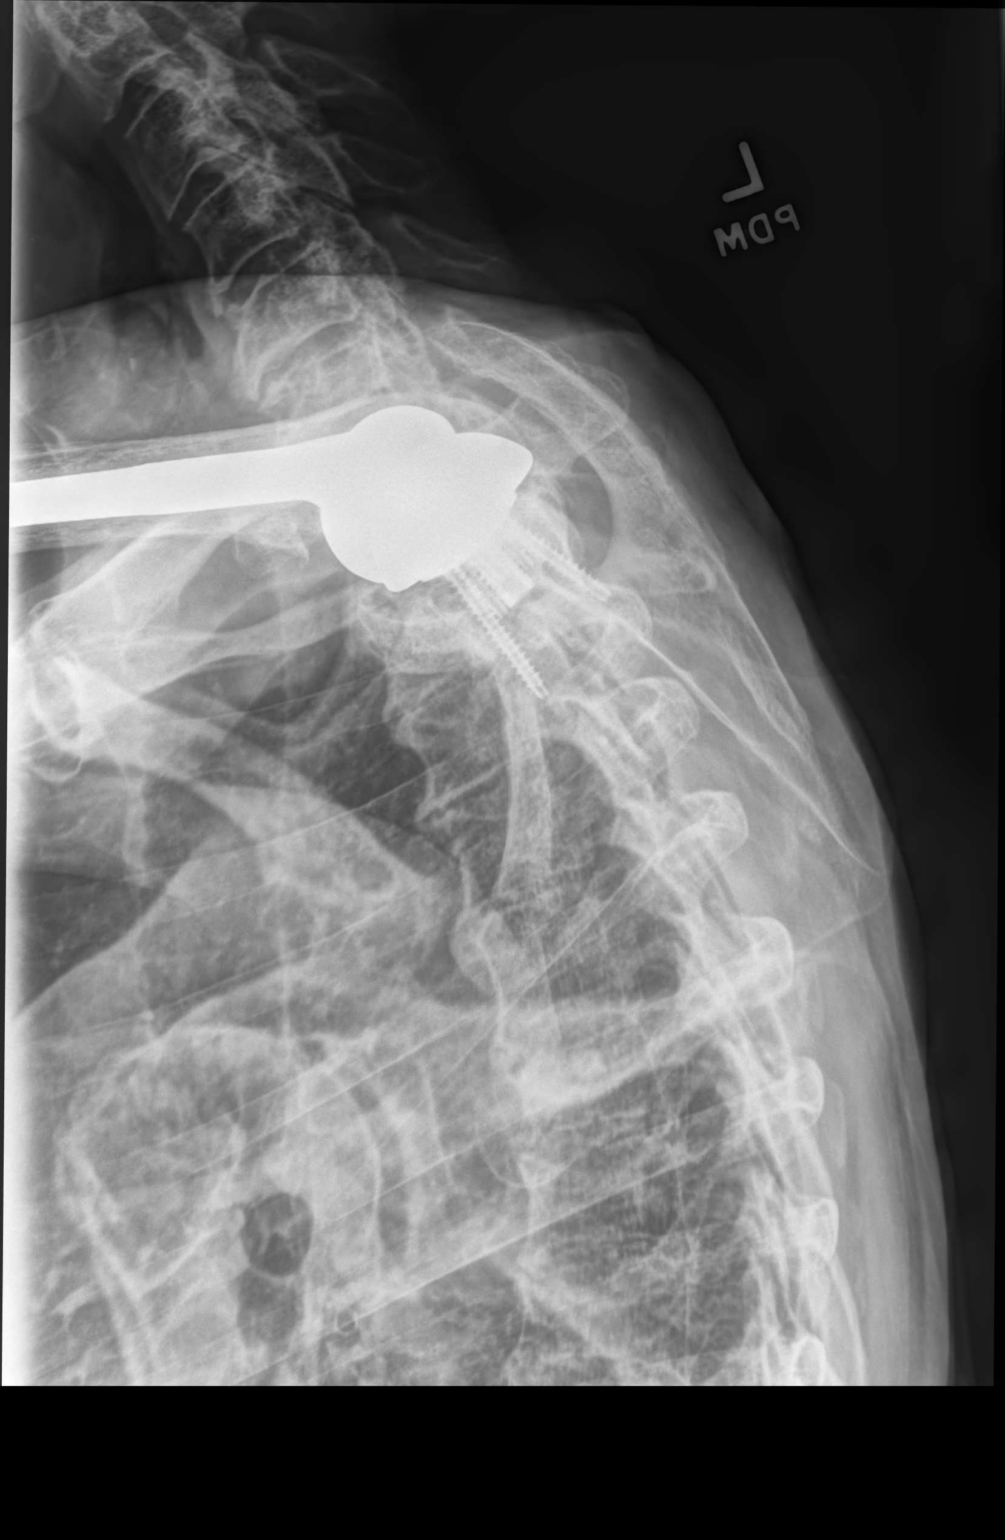

[3 of 3 positions shown; findings below may reference images not displayed]

FINDINGS: There is no evidence of thoracic spine fracture. Alignment is
normal. No other significant bone abnormalities are identified.
IMPRESSION: Negative.

## 2020-03-10 ENCOUNTER — Other Ambulatory Visit: Payer: Self-pay | Admitting: Internal Medicine

## 2020-03-18 ENCOUNTER — Ambulatory Visit: Payer: Medicare HMO

## 2020-03-18 NOTE — Progress Notes (Deleted)
Chronic Care Management Pharmacy Note  03/18/2020 Name:  Mark Jordan MRN:  [id number] DOB:  [birth year]  Subjective: Mark Jordan is an 80 y.o. year old male who is a primary patient of Janith Lima, MD.  The CCM team was consulted for assistance with disease management and care coordination needs.    Engaged with patient face to face for initial visit in response to provider referral for pharmacy case management and/or care coordination services.   Consent to Services:  The patient was given the following information about Chronic Care Management services today, agreed to services, and gave verbal consent: 1. CCM service includes personalized support from designated clinical staff supervised by the primary care provider, including individualized plan of care and coordination with other care providers 2. 24/7 contact phone numbers for assistance for urgent and routine care needs. 3. Service will only be billed when office clinical staff spend 20 minutes or more in a month to coordinate care. 4. Only one practitioner may furnish and bill the service in a calendar month. 5.The patient may stop CCM services at any time (effective at the end of the month) by phone call to the office staff. 6. The patient will be responsible for cost sharing (co-pay) of up to 20% of the service fee (after annual deductible is met). Patient agreed to services and consent obtained.  Patient Care Team: Janith Lima, MD as PCP - General (Internal Medicine) Richmond Campbell, MD as Consulting Physician (Gastroenterology) Kristopher Oppenheim (Dentistry) Myrlene Broker, MD (Urology) Pa, [place] (Podiatry) Charlton Haws, [facility] as Pharmacist (Pharmacist)  Recent office visits: 08/09/19 Dr Ronnald Ramp OV: CPX, c/o burning sensation in chest. Changed omeprazole to Dexilant.  Recent consult visits: 02/05/20 [facility] [place] (podiatry) 09/17/19 Dr [facility] f/u BPH, hx kidney stone. No med  changes.  Hospital visits: None in previous 6 months  Objective:  Lab Results  Component Value Date   CREATININE 0.75 08/09/2019   BUN 22 08/09/2019   GFR 113.96 03/08/2019   GFRNONAA >60 02/17/2019   GFRAA >60 02/17/2019   NA 141 08/09/2019   K 4.8 08/09/2019   CALCIUM 9.5 08/09/2019   CO2 31 08/09/2019    Lab Results  Component Value Date/Time   HGBA1C 5.9 (H) 08/09/2019 10:29 AM   HGBA1C 6.3 03/08/2019 02:38 PM   GFR 113.96 03/08/2019 02:38 PM   GFR 116.18 05/30/2018 09:30 AM    Last diabetic Eye exam: No results found for: HMDIABEYEEXA  Last diabetic Foot exam: No results found for: HMDIABFOOTEX   Lab Results  Component Value Date   CHOL 207 (H) 08/09/2019   HDL 46 08/09/2019   LDLCALC 144 (H) 08/09/2019   LDLDIRECT 144.2 02/07/2013   TRIG 72 08/09/2019   CHOLHDL 4.5 08/09/2019    Hepatic Function Latest Ref Rng & Units 08/09/2019 05/30/2018 04/11/2017  Total Protein 6.1 - 8.1 g/dL 7.1 6.6 7.1  Albumin 3.5 - 5.2 g/dL - 4.3 4.4  AST 10 - 35 U/L _0 ALT 9 - 46 U/L _1 Alk Phosphatase 39 - 117 U/L - 51 45  Total Bilirubin 0.2 - 1.2 mg/dL 0.8 0.8 0.6  Bilirubin, Direct 0.0 - 0.2 mg/dL 0.2 - -    Lab Results  Component Value Date/Time   TSH 2.26 08/09/2019 10:29 AM   TSH 2.20 05/30/2018 09:30 AM    CBC Latest Ref Rng & Units 08/09/2019 03/08/2019 02/17/2019  WBC 3.8 - 10.8 Thousand/uL  5.6 5.3 3.6(L)  Hemoglobin 13.2 - 17.1 g/dL 15.8 14.0 13.8  Hematocrit 38.5 - 50.0 % 47.5 42.5 41.8  Platelets 140 - 400 Thousand/uL 212 257.0 132(L)    No results found for: VD25OH  Clinical ASCVD: No  The ASCVD Risk score Mikey Bussing DC Jr., et al., 2013) failed to calculate for the following reasons:   The 2013 ASCVD risk score is only valid for ages 67 to 31    Depression screen PHQ 2/9 08/09/2019 05/30/2018 03/24/2017  Decreased Interest 0 0 0  Down, Depressed, Hopeless 0 0 0  PHQ - 2 Score 0 0 0  Altered sleeping - - -  Tired, decreased energy - - -  Change in  appetite - - -  Feeling bad or failure about yourself  - - -  Trouble concentrating - - -  Moving slowly or fidgety/restless - - -  Suicidal thoughts - - -  PHQ-9 Score - - -  Difficult doing work/chores - - -    No flowsheet data found.   Lab Results  Component Value Date/Time   PSA 0.97 05/30/2018 09:30 AM   PSA 1.3 03/22/2017 12:00 AM   PSA 0.80 02/07/2013 07:52 AM    Social History   Tobacco Use  Smoking Status Never Smoker  Smokeless Tobacco Never Used   BP Readings from Last 3 Encounters:  08/09/19 122/78  03/08/19 140/80  02/17/19 133/81   Pulse Readings from Last 3 Encounters:  08/09/19 (!) 59  03/08/19 66  02/17/19 (!) 55   Wt Readings from Last 3 Encounters:  08/09/19 155 lb (70.3 kg)  03/08/19 150 lb (68 kg)  02/17/19 153 lb (69.4 kg)    Assessment/Interventions: Review of patient past medical history, allergies, medications, health status, including review of consultants reports, laboratory and other test data, was performed as part of comprehensive evaluation and provision of chronic care management services.   SDOH:  (Social Determinants of Health) assessments and interventions performed: {yes/no:20286}   CCM Care Plan  No Known Allergies  Medications Reviewed Today    Reviewed by Landis Martins, DPM (Physician) on 02/05/20 at 2220  Med List Status: <None>  Medication Order Taking? Sig Documenting Provider Last Dose Status Informant  ALPRAZolam (XANAX) 0.5 MG tablet [id number]  TAKE 1/2-1 TABLET BY MOUTH ONCE DAILY ATBEDTIME AS NEEDED ONLY. Janith Lima, MD  Active   amLODipine-benazepril (LOTREL) 10-20 MG capsule [id number]  TAKE 1 CAPSULE BY MOUTH ONCE DAILY. Janith Lima, MD  Active   cyclobenzaprine (FLEXERIL) 5 MG tablet [id number] No Take 2 tablets (10 mg total) by mouth 2 (two) times daily as needed for up to 10 doses for muscle spasms. Lennice Sites, DO Taking Active   dexlansoprazole (DEXILANT) 60 MG capsule [id number]  Take 1 capsule  (60 mg total) by mouth daily. Janith Lima, MD  Active   Doxepin HCl (SILENOR) 3 MG TABS [id number] No Take 1 tablet (3 mg total) by mouth at bedtime as needed. Janith Lima, MD Taking Active   finasteride (PROSCAR) 5 MG tablet [id number] No TAKE ONE (1) TABLET ONCE DAILY [provider] Taking Active   gabapentin (NEURONTIN) 300 MG capsule [id number] No TAKE ONE CAPSULE BY MOUTH AT BEDTIME Janith Lima, MD Taking Active   Omega-3 Fatty Acids (FISH OIL) 1000 MG CPDR [id number] No Take 1,000 mcg by mouth daily. [provider] Taking Active   omeprazole (PRILOSEC) 40 MG capsule [id number]  TAKE ONE (1) CAPSULE EACH DAY Janith Lima,  MD  Active   rosuvastatin (CRESTOR) 10 MG tablet [id number] No TAKE ONE (1) TABLET ONCE DAILY Marrian Salvage, FNP Taking Active   Tamsulosin HCl (FLOMAX) 0.4 MG CAPS [id number] No Take 0.4 mg by mouth daily after supper.  [provider] Taking Active Self          Patient Active Problem List   Diagnosis Date Noted  . Chest pain at rest 08/09/2019  . Chronic right-sided thoracic back pain 03/08/2019  . Spinal stenosis of cervicothoracic region 03/08/2019  . Arm paresthesia, left 08/22/2018  . Other microscopic hematuria 05/30/2018  . History of adenomatous polyp of colon 07/15/2017  . Routine general medical examination at a health care facility 03/24/2017  . Arthralgia of right acromioclavicular joint 04/20/2016  . Lumbar radicular pain 10/08/2015  . Cervical radiculitis 10/08/2015  . GAD (generalized anxiety disorder) 07/03/2015  . Prediabetes 11/01/2014  . Insomnia, persistent 10/29/2013  . Drusen, retina, bilateral 09/03/2013  . Senile nuclear sclerosis, bilateral 09/03/2013  . 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 DJD (degenerative joint disease) 02/21/2012  . Hyperlipidemia with target LDL less than 100 11/22/2006  . Essential hypertension  11/22/2006  . ALLERGIC RHINITIS 11/22/2006  . GERD 11/22/2006    Immunization History  Administered Date(s) Administered  . Influenza Inj Mdck Quad Pf 11/06/2018  . Influenza Split 10/18/2011  . Influenza Whole 12/05/2009  . Influenza, High Dose Seasonal PF 03/02/2016, 03/21/2017  . Influenza,inj,Quad PF,6+ Mos 11/01/2014  . Pneumococcal Conjugate-13 03/12/2015  . Pneumococcal Polysaccharide-23 04/03/2001, 03/31/2009  . Td 04/03/2001  . Tdap 07/21/2011    Conditions to be addressed/monitored:  Hypertension, Hyperlipidemia, GERD, Anxiety, Osteoarthritis and BPH  There are no care plans that you recently modified to display for this patient.    Medication Assistance: {MEDASSISTANCEINFO:25044}  Patient's preferred pharmacy is:  [place], [place] - [place] [address] Phone: [phone] Fax: [phone]  [place], [place] - [address] [place] [address] Phone: [phone] Fax: [phone]  [id number] [place] [address] [place]: [phone]: [phone] Uses pill box? {Yes or If no, why not?:20788} Pt endorses ***% compliance  We discussed: {Pharmacy options:24294} Patient decided to: {US Pharmacy Plan:23885}  Care Plan and Follow Up Patient Decision:  {FOLLOWUP:24991}  Plan: {CM FOLLOW UP PLAN:25073}  ***    Current Barriers:  . {pharmacybarriers:24917} . ***  Pharmacist Clinical Goal(s):  .Marland KitchenOver the next *** days, patient will {PHARMACYGOALCHOICES:24921} through collaboration with PharmD and provider.  . ***  Interventions: . 1:1 collaboration with JJanith Lima MD regarding development and update of comprehensive plan of care as evidenced by provider attestation and co-signature . Inter-disciplinary care team collaboration (see longitudinal plan of  care) . Comprehensive medication review performed; medication list updated in electronic medical record  Hypertension (BP goal <130/80) -{CHL Controlled/Uncontrolled:[phone]} -Current treatment: . Amlodipine-benazepril 10-20 mg daily -Medications previously tried: ***  -Current home readings: *** -Current dietary habits: *** -Current exercise habits: *** -{ACTIONS;DENIES/REPORTS:21021675::"Denies"} hypotensive/hypertensive symptoms -Educated on {CCM BP Counseling:25124} -Counseled to monitor BP at home ***, document, and provide log at future appointments -{CCMPHARMDINTERVENTION:25122}  Hyperlipidemia: (LDL goal < 100) -{CHL Controlled/Uncontrolled:[phone]} -Current treatment: . Rosuvastatin 10 mg daily - not on dispense report . Omega-3 Fish Oil 1000 mg daily -Medications  previously tried: simvastatin -Current dietary patterns: *** -Current exercise habits: *** -Educated on {CCM HLD Counseling:25126} -{CCMPHARMDINTERVENTION:25122}  Anxiety / Insomnia (Goal: ***) -{CHL Controlled/Uncontrolled:[phone]} -Current treatment: . Alprazolam 0.5 mg - 1/2-1 tab HS prn . Doxepin 3 mg HS prn - not on dispense report -Medications previously tried/failed: *** -PHQ9: 0 -GAD7: not on file -Connected with *** for mental health support -Educated on {CCM mental health counseling:25127} -{CCMPHARMDINTERVENTION:25122}  GERD (Goal: ***) -{CHL Controlled/Uncontrolled:[phone]} -Current treatment  . Dexilant 60 mg daily -not on dispense report . Omeprazole 40 mg daily -Medications previously tried: ***  -{CCMPHARMDINTERVENTION:25122}  BPH (Goal: manage symptoms, prevent progression) -{CHL Controlled/Uncontrolled:[phone]} -Current treatment  . Finasteride 5 mg daily . Tamsulosin 0.4 mg daily -Medications previously tried: ***  -{CCMPHARMDINTERVENTION:25122}  Osteoarthritis / DJD (Goal: ***) -{CHL Controlled/Uncontrolled:[phone]} -Current treatment  . Gabapentin 300  mg HS . Cyclobenzaprine 5 mg PRN -Medications previously tried: ***  -{CCMPHARMDINTERVENTION:25122}  Health Maintenance -Vaccine gaps: COVID vaccine, Flu -Current therapy:  . *** -Educated on {ccm supplement counseling:25128} -{CCM Patient satisfied:25129} -{CCMPHARMDINTERVENTION:25122}  Patient Goals/Self-Care Activities . Over the next *** days, patient will:  - {pharmacypatientgoals:24919}  Follow Up Plan: {CM FOLLOW UP TYVD:73225}

## 2020-03-27 DIAGNOSIS — N400 Enlarged prostate without lower urinary tract symptoms: Secondary | ICD-10-CM | POA: Diagnosis not present

## 2020-03-27 DIAGNOSIS — N323 Diverticulum of bladder: Secondary | ICD-10-CM | POA: Diagnosis not present

## 2020-03-27 DIAGNOSIS — N35012 Post-traumatic membranous urethral stricture: Secondary | ICD-10-CM | POA: Diagnosis not present

## 2020-03-27 DIAGNOSIS — N281 Cyst of kidney, acquired: Secondary | ICD-10-CM | POA: Diagnosis not present

## 2020-03-27 DIAGNOSIS — N200 Calculus of kidney: Secondary | ICD-10-CM | POA: Diagnosis not present

## 2020-03-29 ENCOUNTER — Other Ambulatory Visit: Payer: Self-pay | Admitting: Internal Medicine

## 2020-03-29 DIAGNOSIS — F411 Generalized anxiety disorder: Secondary | ICD-10-CM

## 2020-04-02 DIAGNOSIS — K219 Gastro-esophageal reflux disease without esophagitis: Secondary | ICD-10-CM | POA: Diagnosis not present

## 2020-04-02 DIAGNOSIS — K612 Anorectal abscess: Secondary | ICD-10-CM | POA: Diagnosis not present

## 2020-04-02 DIAGNOSIS — K6289 Other specified diseases of anus and rectum: Secondary | ICD-10-CM | POA: Diagnosis not present

## 2020-04-17 ENCOUNTER — Other Ambulatory Visit: Payer: Self-pay | Admitting: Internal Medicine

## 2020-04-24 ENCOUNTER — Other Ambulatory Visit: Payer: Self-pay | Admitting: Internal Medicine

## 2020-08-06 ENCOUNTER — Other Ambulatory Visit: Payer: Self-pay | Admitting: Internal Medicine

## 2020-08-14 ENCOUNTER — Encounter: Payer: Self-pay | Admitting: Internal Medicine

## 2020-08-14 ENCOUNTER — Ambulatory Visit (INDEPENDENT_AMBULATORY_CARE_PROVIDER_SITE_OTHER): Payer: Medicare HMO | Admitting: Internal Medicine

## 2020-08-14 ENCOUNTER — Other Ambulatory Visit: Payer: Self-pay

## 2020-08-14 VITALS — BP 164/88 | HR 61 | Temp 98.0°F | Resp 16 | Ht 68.0 in | Wt 159.0 lb

## 2020-08-14 DIAGNOSIS — Z0000 Encounter for general adult medical examination without abnormal findings: Secondary | ICD-10-CM

## 2020-08-14 DIAGNOSIS — I10 Essential (primary) hypertension: Secondary | ICD-10-CM | POA: Diagnosis not present

## 2020-08-14 DIAGNOSIS — E785 Hyperlipidemia, unspecified: Secondary | ICD-10-CM

## 2020-08-14 DIAGNOSIS — R7303 Prediabetes: Secondary | ICD-10-CM

## 2020-08-14 DIAGNOSIS — K2100 Gastro-esophageal reflux disease with esophagitis, without bleeding: Secondary | ICD-10-CM | POA: Diagnosis not present

## 2020-08-14 DIAGNOSIS — N401 Enlarged prostate with lower urinary tract symptoms: Secondary | ICD-10-CM | POA: Diagnosis not present

## 2020-08-14 DIAGNOSIS — N138 Other obstructive and reflux uropathy: Secondary | ICD-10-CM | POA: Diagnosis not present

## 2020-08-14 LAB — URINALYSIS, ROUTINE W REFLEX MICROSCOPIC
Bilirubin Urine: NEGATIVE
Hgb urine dipstick: NEGATIVE
Ketones, ur: NEGATIVE
Leukocytes,Ua: NEGATIVE
Nitrite: NEGATIVE
RBC / HPF: NONE SEEN (ref 0–?)
Specific Gravity, Urine: 1.01 (ref 1.000–1.030)
Total Protein, Urine: NEGATIVE
Urine Glucose: NEGATIVE
Urobilinogen, UA: 0.2 (ref 0.0–1.0)
pH: 7 (ref 5.0–8.0)

## 2020-08-14 LAB — CBC WITH DIFFERENTIAL/PLATELET
Basophils Absolute: 0 10*3/uL (ref 0.0–0.1)
Basophils Relative: 0.7 % (ref 0.0–3.0)
Eosinophils Absolute: 0.1 10*3/uL (ref 0.0–0.7)
Eosinophils Relative: 1.1 % (ref 0.0–5.0)
HCT: 44.7 % (ref 39.0–52.0)
Hemoglobin: 15 g/dL (ref 13.0–17.0)
Lymphocytes Relative: 31.5 % (ref 12.0–46.0)
Lymphs Abs: 1.7 10*3/uL (ref 0.7–4.0)
MCHC: 33.6 g/dL (ref 30.0–36.0)
MCV: 92.4 fl (ref 78.0–100.0)
Monocytes Absolute: 0.4 10*3/uL (ref 0.1–1.0)
Monocytes Relative: 7.7 % (ref 3.0–12.0)
Neutro Abs: 3.2 10*3/uL (ref 1.4–7.7)
Neutrophils Relative %: 59 % (ref 43.0–77.0)
Platelets: 187 10*3/uL (ref 150.0–400.0)
RBC: 4.84 Mil/uL (ref 4.22–5.81)
RDW: 13.4 % (ref 11.5–15.5)
WBC: 5.5 10*3/uL (ref 4.0–10.5)

## 2020-08-14 LAB — BASIC METABOLIC PANEL
BUN: 16 mg/dL (ref 6–23)
CO2: 30 mEq/L (ref 19–32)
Calcium: 9.6 mg/dL (ref 8.4–10.5)
Chloride: 104 mEq/L (ref 96–112)
Creatinine, Ser: 0.86 mg/dL (ref 0.40–1.50)
GFR: 80.89 mL/min (ref >60.00)
Glucose, Bld: 104 mg/dL — ABNORMAL HIGH (ref 70–99)
Potassium: 4 mEq/L (ref 3.5–5.1)
Sodium: 140 mEq/L (ref 135–145)

## 2020-08-14 LAB — HEPATIC FUNCTION PANEL
ALT: 18 U/L (ref 0–53)
AST: 18 U/L (ref 0–37)
Albumin: 4.5 g/dL (ref 3.5–5.2)
Alkaline Phosphatase: 51 U/L (ref 39–117)
Bilirubin, Direct: 0.1 mg/dL (ref 0.0–0.3)
Total Bilirubin: 0.5 mg/dL (ref 0.2–1.2)
Total Protein: 7.4 g/dL (ref 6.0–8.3)

## 2020-08-14 LAB — LIPID PANEL
Cholesterol: 192 mg/dL (ref 0–200)
HDL: 43.3 mg/dL (ref >39.00)
LDL Cholesterol: 133 mg/dL — ABNORMAL HIGH (ref 0–99)
NonHDL: 148.23
Total CHOL/HDL Ratio: 4
Triglycerides: 78 mg/dL (ref 0.0–149.0)
VLDL: 15.6 mg/dL (ref 0.0–40.0)

## 2020-08-14 LAB — TSH: TSH: 2.43 u[IU]/mL (ref 0.35–5.50)

## 2020-08-14 LAB — HEMOGLOBIN A1C: Hgb A1c MFr Bld: 6.2 % (ref 4.6–6.5)

## 2020-08-14 MED ORDER — OMEPRAZOLE 40 MG PO CPDR: 40.0000 mg | DELAYED_RELEASE_CAPSULE | Freq: Every day | ORAL | 1 refills | Status: DC

## 2020-08-14 MED ORDER — AMLODIPINE BESY-BENAZEPRIL HCL 10-20 MG PO CAPS: 1.0000 | ORAL_CAPSULE | Freq: Every day | ORAL | 1 refills | Status: DC

## 2020-08-14 MED ORDER — ROSUVASTATIN CALCIUM 10 MG PO TABS
ORAL_TABLET | ORAL | 1 refills | Status: DC
Start: 2020-08-14 — End: 2021-02-16

## 2020-08-14 NOTE — Progress Notes (Signed)
Subjective:  Patient ID: Mark Jordan, male    DOB: 02-Apr-1940  Age: 80 y.o. MRN: [id number]  CC: Annual Exam, Hypertension, Hyperlipidemia, and Gastroesophageal Reflux  This visit occurred during the SARS-CoV-2 public health emergency.  Safety protocols were in place, including screening questions prior to the visit, additional usage of staff PPE, and extensive cleaning of exam room while observing appropriate contact time as indicated for disinfecting solutions.    HPI Mark Jordan presents for a CPX and f/up -  He ran out of his antihypertensives 2 weeks ago.  He complains of weight gain.  He is active and denies any recent episodes of headache, blurred vision, dizziness, lightheadedness, chest pain, shortness of breath, edema, or palpitations.  Outpatient Medications Prior to Visit  Medication Sig Dispense Refill   ALPRAZolam (XANAX) 0.5 MG tablet TAKE 1/2-1 TABLET BY MOUTH ONCE DAILY ATBEDTIME AS NEEDED ONLY. 30 tablet 5   finasteride (PROSCAR) 5 MG tablet TAKE ONE (1) TABLET ONCE DAILY     Omega-3 Fatty Acids (FISH OIL) 1000 MG CPDR Take 1,000 mcg by mouth daily.     Tamsulosin HCl (FLOMAX) 0.4 MG CAPS Take 0.4 mg by mouth daily after supper.      amLODipine-benazepril (LOTREL) 10-20 MG capsule TAKE 1 CAPSULE BY MOUTH ONCE DAILY. 90 capsule 0   cyclobenzaprine (FLEXERIL) 5 MG tablet Take 2 tablets (10 mg total) by mouth 2 (two) times daily as needed for up to 10 doses for muscle spasms. 10 tablet 0   dexlansoprazole (DEXILANT) 60 MG capsule Take 1 capsule (60 mg total) by mouth daily. 90 capsule 1   Doxepin HCl (SILENOR) 3 MG TABS Take 1 tablet (3 mg total) by mouth at bedtime as needed. 90 tablet 3   gabapentin (NEURONTIN) 300 MG capsule TAKE ONE CAPSULE BY MOUTH AT BEDTIME 30 capsule 3   omeprazole (PRILOSEC) 40 MG capsule TAKE ONE (1) CAPSULE EACH DAY 90 capsule 1   rosuvastatin (CRESTOR) 10 MG tablet TAKE ONE (1) TABLET ONCE DAILY 90 tablet 0   No facility-administered  medications prior to visit.    ROS Review of Systems  Constitutional:  Positive for unexpected weight change. Negative for appetite change, diaphoresis and fatigue.  HENT: Negative.    Eyes: Negative.   Respiratory:  Negative for cough, chest tightness, shortness of breath and wheezing.   Cardiovascular:  Negative for chest pain, palpitations and leg swelling.  Gastrointestinal:  Negative for abdominal pain, constipation, diarrhea, nausea and vomiting.  Endocrine: Negative.   Genitourinary: Negative.  Negative for difficulty urinating and dysuria.  Musculoskeletal:  Negative for arthralgias and myalgias.  Skin: Negative.  Negative for color change.  Neurological:  Negative for dizziness, weakness and light-headedness.  Hematological:  Negative for adenopathy. Does not bruise/bleed easily.  Psychiatric/Behavioral: Negative.     Objective:  BP (!) 164/88 (BP Location: Left Arm, Patient Position: Sitting, Cuff Size: Large)   Pulse 61   Temp 98 F (36.7 C) (Oral)   Resp 16   Ht 5\' 8"  (1.727 m)   Wt 159 lb (72.1 kg)   SpO2 97%   BMI 24.18 kg/m   BP Readings from Last 3 Encounters:  08/14/20 (!) 164/88  08/09/19 122/78  03/08/19 140/80    Wt Readings from Last 3 Encounters:  08/14/20 159 lb (72.1 kg)  08/09/19 155 lb (70.3 kg)  03/08/19 150 lb (68 kg)    Physical Exam Vitals reviewed.  HENT:     Nose: Nose normal.  Mouth/Throat:     Mouth: Mucous membranes are moist.  Eyes:     Conjunctiva/sclera: Conjunctivae normal.  Cardiovascular:     Rate and Rhythm: Normal rate and regular rhythm.     Heart sounds: No murmur heard. Pulmonary:     Effort: Pulmonary effort is normal.     Breath sounds: No stridor. No wheezing, rhonchi or rales.  Abdominal:     General: Abdomen is flat. Bowel sounds are normal. There is no distension.     Palpations: Abdomen is soft. There is no hepatomegaly, splenomegaly or mass.     Tenderness: There is no abdominal tenderness. There is  no guarding.  Musculoskeletal:        General: Normal range of motion.     Cervical back: Neck supple.     Right lower leg: No edema.     Left lower leg: No edema.  Lymphadenopathy:     Cervical: No cervical adenopathy.  Skin:    General: Skin is warm and dry.     Coloration: Skin is not pale.  Neurological:     General: No focal deficit present.     Mental Status: He is alert.  Psychiatric:        Mood and Affect: Mood normal.        Behavior: Behavior normal.    Lab Results  Component Value Date   WBC 5.5 08/14/2020   HGB 15.0 08/14/2020   HCT 44.7 08/14/2020   PLT 187.0 08/14/2020   GLUCOSE 104 (H) 08/14/2020   CHOL 192 08/14/2020   TRIG 78.0 08/14/2020   HDL 43.30 08/14/2020   LDLDIRECT 144.2 02/07/2013   LDLCALC 133 (H) 08/14/2020   ALT 18 08/14/2020   AST 18 08/14/2020   NA 140 08/14/2020   K 4.0 08/14/2020   CL 104 08/14/2020   CREATININE 0.86 08/14/2020   BUN 16 08/14/2020   CO2 30 08/14/2020   TSH 2.43 08/14/2020   PSA 0.97 05/30/2018   INR 0.97 09/10/2009   HGBA1C 6.2 08/14/2020    DG Chest Portable 1 View  Result Date: 02/17/2019 CLINICAL DATA:  80 year old male with history of cough for 1 week. Shortness of breath. EXAM: PORTABLE CHEST 1 VIEW COMPARISON:  Chest x-[place] 09/30/2018. FINDINGS: Lung volumes are normal. No consolidative airspace disease. No pleural effusions. No evidence of pulmonary edema. Heart size is normal. The patient is rotated to the right on today's exam, resulting in distortion of the mediastinal contours and reduced diagnostic sensitivity and specificity for mediastinal pathology. Atherosclerotic calcifications in the thoracic aorta. Left shoulder arthroplasty. IMPRESSION: 1. No radiographic evidence of acute cardiopulmonary disease. 2. Aortic atherosclerosis. Electronically Signed   By: Vinnie Langton M.D.   On: 02/17/2019 11:38    Assessment & Plan:   Raidon was seen today for annual exam, hypertension, hyperlipidemia and  gastroesophageal reflux.  Diagnoses and all orders for this visit:  Essential hypertension- His blood pressure is not adequately well controlled.  Will restart amlodipine and benazepril.  His renal function and electrolytes are normal. -     CBC with Differential/Platelet; Future -     Basic metabolic panel; Future -     Urinalysis, Routine w reflex microscopic; Future -     amLODipine-benazepril (LOTREL) 10-20 MG capsule; Take 1 capsule by mouth daily. -     Urinalysis, Routine w reflex microscopic -     Basic metabolic panel -     CBC with Differential/Platelet  Gastroesophageal reflux disease with esophagitis without hemorrhage-  His symptoms are well controlled. -     CBC with Differential/Platelet; Future -     omeprazole (PRILOSEC) 40 MG capsule; Take 1 capsule (40 mg total) by mouth daily. -     CBC with Differential/Platelet  BPH with obstruction/lower urinary tract symptoms- He tells me that he is seeing a urologist next week.  Hyperlipidemia with target LDL less than 100- LDL goal achieved. Doing well on the statin  -     Lipid panel; Future -     Hepatic function panel; Future -     TSH; Future -     rosuvastatin (CRESTOR) 10 MG tablet; TAKE ONE (1) TABLET ONCE DAILY -     TSH -     Hepatic function panel -     Lipid panel  Prediabetes- His A1c is at 6.2%.  Medical therapy is not indicated. -     Hemoglobin A1c; Future -     Hemoglobin A1c  Routine general medical examination at a health care facility- Exam completed, labs reviewed, vaccines are up-to-date, no cancer screenings indicated, patient education material was given.  I have discontinued Sabastien V. Heinemann's Doxepin HCl, gabapentin, cyclobenzaprine, and Dexilant. I have also changed his omeprazole and amLODipine-benazepril. Additionally, I am having him maintain his tamsulosin, Fish Oil, finasteride, ALPRAZolam, and rosuvastatin.  Meds ordered this encounter  Medications   omeprazole (PRILOSEC) 40 MG capsule     Sig: Take 1 capsule (40 mg total) by mouth daily.    Dispense:  90 capsule    Refill:  1   rosuvastatin (CRESTOR) 10 MG tablet    Sig: TAKE ONE (1) TABLET ONCE DAILY    Dispense:  90 tablet    Refill:  1   amLODipine-benazepril (LOTREL) 10-20 MG capsule    Sig: Take 1 capsule by mouth daily.    Dispense:  90 capsule    Refill:  1      Follow-up: Return in about 6 months (around 02/14/2021).  Scarlette Calico, MD

## 2020-08-14 NOTE — Patient Instructions (Signed)
Health Maintenance, Male Adopting a healthy lifestyle and getting preventive care are important in promoting health and wellness. Ask your health care provider about: The right schedule for you to have regular tests and exams. Things you can do on your own to prevent diseases and keep yourself healthy. What should I know about diet, weight, and exercise? Eat a healthy diet  Eat a diet that includes plenty of vegetables, fruits, low-fat dairy products, and lean protein. Do not eat a lot of foods that are high in solid fats, added sugars, or sodium.  Maintain a healthy weight Body mass index (BMI) is a measurement that can be used to identify possible weight problems. It estimates body fat based on height and weight. Your health care provider can help determine your BMI and help you achieve or maintain ahealthy weight. Get regular exercise Get regular exercise. This is one of the most important things you can do for your health. Most adults should: Exercise for at least 150 minutes each week. The exercise should increase your heart rate and make you sweat (moderate-intensity exercise). Do strengthening exercises at least twice a week. This is in addition to the moderate-intensity exercise. Spend less time sitting. Even light physical activity can be beneficial. Watch cholesterol and blood lipids Have your blood tested for lipids and cholesterol at 82 years of age, then havethis test every 5 years. You may need to have your cholesterol levels checked more often if: Your lipid or cholesterol levels are high. You are older than 82 years of age. You are at high risk for heart disease. What should I know about cancer screening? Many types of cancers can be detected early and may often be prevented. Depending on your health history and family history, you may need to have cancer screening at various ages. This may include screening for: Colorectal cancer. Prostate cancer. Skin cancer. Lung  cancer. What should I know about heart disease, diabetes, and high blood pressure? Blood pressure and heart disease High blood pressure causes heart disease and increases the risk of stroke. This is more likely to develop in people who have high blood pressure readings, are of African descent, or are overweight. Talk with your health care provider about your target blood pressure readings. Have your blood pressure checked: Every 3-5 years if you are 18-39 years of age. Every year if you are 40 years old or older. If you are between the ages of 65 and 75 and are a current or former smoker, ask your health care provider if you should have a one-time screening for abdominal aortic aneurysm (AAA). Diabetes Have regular diabetes screenings. This checks your fasting blood sugar level. Have the screening done: Once every three years after age 45 if you are at a normal weight and have a low risk for diabetes. More often and at a younger age if you are overweight or have a high risk for diabetes. What should I know about preventing infection? Hepatitis B If you have a higher risk for hepatitis B, you should be screened for this virus. Talk with your health care provider to find out if you are at risk forhepatitis B infection. Hepatitis C Blood testing is recommended for: Everyone born from 1945 through 1965. Anyone with known risk factors for hepatitis C. Sexually transmitted infections (STIs) You should be screened each year for STIs, including gonorrhea and chlamydia, if: You are sexually active and are younger than 82 years of age. You are older than 82 years of age   and your health care provider tells you that you are at risk for this type of infection. Your sexual activity has changed since you were last screened, and you are at increased risk for chlamydia or gonorrhea. Ask your health care provider if you are at risk. Ask your health care provider about whether you are at high risk for HIV.  Your health care provider may recommend a prescription medicine to help prevent HIV infection. If you choose to take medicine to prevent HIV, you should first get tested for HIV. You should then be tested every 3 months for as long as you are taking the medicine. Follow these instructions at home: Lifestyle Do not use any products that contain nicotine or tobacco, such as cigarettes, e-cigarettes, and chewing tobacco. If you need help quitting, ask your health care provider. Do not use street drugs. Do not share needles. Ask your health care provider for help if you need support or information about quitting drugs. Alcohol use Do not drink alcohol if your health care provider tells you not to drink. If you drink alcohol: Limit how much you have to 0-2 drinks a day. Be aware of how much alcohol is in your drink. In the [place] one drink equals one 12 oz bottle of beer (355 mL), one 5 oz glass of wine (148 mL), or one 1 oz glass of hard liquor (44 mL). General instructions Schedule regular health, dental, and eye exams. Stay current with your vaccines. Tell your health care provider if: You often feel depressed. You have ever been abused or do not feel safe at home. Summary Adopting a healthy lifestyle and getting preventive care are important in promoting health and wellness. Follow your health care provider's instructions about healthy diet, exercising, and getting tested or screened for diseases. Follow your health care provider's instructions on monitoring your cholesterol and blood pressure. This information is not intended to replace advice given to you by your health care provider. Make sure you discuss any questions you have with your healthcare provider. Document Revised: 01/11/2018 Document Reviewed: 01/11/2018 Elsevier Patient Education  2022 [employer] Inc.  

## 2020-09-16 DIAGNOSIS — H2513 Age-related nuclear cataract, bilateral: Secondary | ICD-10-CM | POA: Diagnosis not present

## 2020-09-20 DIAGNOSIS — L0291 Cutaneous abscess, unspecified: Secondary | ICD-10-CM | POA: Diagnosis not present

## 2020-09-24 ENCOUNTER — Other Ambulatory Visit: Payer: Self-pay | Admitting: Internal Medicine

## 2020-09-24 DIAGNOSIS — F411 Generalized anxiety disorder: Secondary | ICD-10-CM

## 2020-10-08 DIAGNOSIS — L720 Epidermal cyst: Secondary | ICD-10-CM | POA: Diagnosis not present

## 2020-10-08 DIAGNOSIS — Z85828 Personal history of other malignant neoplasm of skin: Secondary | ICD-10-CM | POA: Diagnosis not present

## 2020-10-09 DIAGNOSIS — N281 Cyst of kidney, acquired: Secondary | ICD-10-CM | POA: Diagnosis not present

## 2020-10-09 DIAGNOSIS — N200 Calculus of kidney: Secondary | ICD-10-CM | POA: Diagnosis not present

## 2020-10-09 DIAGNOSIS — N35012 Post-traumatic membranous urethral stricture: Secondary | ICD-10-CM | POA: Diagnosis not present

## 2020-10-09 DIAGNOSIS — N400 Enlarged prostate without lower urinary tract symptoms: Secondary | ICD-10-CM | POA: Diagnosis not present

## 2020-10-09 DIAGNOSIS — N323 Diverticulum of bladder: Secondary | ICD-10-CM | POA: Diagnosis not present

## 2020-11-03 DIAGNOSIS — N200 Calculus of kidney: Secondary | ICD-10-CM | POA: Diagnosis not present

## 2020-11-03 DIAGNOSIS — N281 Cyst of kidney, acquired: Secondary | ICD-10-CM | POA: Diagnosis not present

## 2021-01-07 DIAGNOSIS — R051 Acute cough: Secondary | ICD-10-CM | POA: Diagnosis not present

## 2021-01-07 DIAGNOSIS — R0981 Nasal congestion: Secondary | ICD-10-CM | POA: Diagnosis not present

## 2021-01-07 DIAGNOSIS — J111 Influenza due to unidentified influenza virus with other respiratory manifestations: Secondary | ICD-10-CM | POA: Diagnosis not present

## 2021-01-09 ENCOUNTER — Other Ambulatory Visit: Payer: Self-pay | Admitting: Internal Medicine

## 2021-01-09 DIAGNOSIS — K2100 Gastro-esophageal reflux disease with esophagitis, without bleeding: Secondary | ICD-10-CM

## 2021-02-13 ENCOUNTER — Ambulatory Visit: Payer: Medicare HMO

## 2021-02-16 ENCOUNTER — Other Ambulatory Visit: Payer: Self-pay

## 2021-02-16 ENCOUNTER — Ambulatory Visit (INDEPENDENT_AMBULATORY_CARE_PROVIDER_SITE_OTHER): Payer: Medicare HMO | Admitting: Internal Medicine

## 2021-02-16 ENCOUNTER — Encounter: Payer: Self-pay | Admitting: Internal Medicine

## 2021-02-16 VITALS — BP 142/82 | HR 76 | Temp 98.4°F | Resp 16 | Ht 68.0 in | Wt 154.0 lb

## 2021-02-16 DIAGNOSIS — R7303 Prediabetes: Secondary | ICD-10-CM | POA: Diagnosis not present

## 2021-02-16 DIAGNOSIS — Z23 Encounter for immunization: Secondary | ICD-10-CM

## 2021-02-16 DIAGNOSIS — E785 Hyperlipidemia, unspecified: Secondary | ICD-10-CM | POA: Diagnosis not present

## 2021-02-16 DIAGNOSIS — N401 Enlarged prostate with lower urinary tract symptoms: Secondary | ICD-10-CM

## 2021-02-16 DIAGNOSIS — I10 Essential (primary) hypertension: Secondary | ICD-10-CM

## 2021-02-16 DIAGNOSIS — F411 Generalized anxiety disorder: Secondary | ICD-10-CM | POA: Diagnosis not present

## 2021-02-16 DIAGNOSIS — N138 Other obstructive and reflux uropathy: Secondary | ICD-10-CM

## 2021-02-16 LAB — BASIC METABOLIC PANEL
BUN: 25 mg/dL — ABNORMAL HIGH (ref 6–23)
CO2: 27 mEq/L (ref 19–32)
Calcium: 9.7 mg/dL (ref 8.4–10.5)
Chloride: 107 mEq/L (ref 96–112)
Creatinine, Ser: 0.63 mg/dL (ref 0.40–1.50)
GFR: 88.54 mL/min (ref >60.00)
Glucose, Bld: 110 mg/dL — ABNORMAL HIGH (ref 70–99)
Potassium: 4 mEq/L (ref 3.5–5.1)
Sodium: 141 mEq/L (ref 135–145)

## 2021-02-16 LAB — CBC WITH DIFFERENTIAL/PLATELET
Basophils Absolute: 0 10*3/uL (ref 0.0–0.1)
Basophils Relative: 0.6 % (ref 0.0–3.0)
Eosinophils Absolute: 0.1 10*3/uL (ref 0.0–0.7)
Eosinophils Relative: 1.5 % (ref 0.0–5.0)
HCT: 44.5 % (ref 39.0–52.0)
Hemoglobin: 14.2 g/dL (ref 13.0–17.0)
Lymphocytes Relative: 27.1 % (ref 12.0–46.0)
Lymphs Abs: 1.3 10*3/uL (ref 0.7–4.0)
MCHC: 32 g/dL (ref 30.0–36.0)
MCV: 93.2 fl (ref 78.0–100.0)
Monocytes Absolute: 0.4 10*3/uL (ref 0.1–1.0)
Monocytes Relative: 8.3 % (ref 3.0–12.0)
Neutro Abs: 3.1 10*3/uL (ref 1.4–7.7)
Neutrophils Relative %: 62.5 % (ref 43.0–77.0)
Platelets: 191 10*3/uL (ref 150.0–400.0)
RBC: 4.77 Mil/uL (ref 4.22–5.81)
RDW: 14.1 % (ref 11.5–15.5)
WBC: 5 10*3/uL (ref 4.0–10.5)

## 2021-02-16 LAB — HEMOGLOBIN A1C: Hgb A1c MFr Bld: 6.4 % (ref 4.6–6.5)

## 2021-02-16 MED ORDER — TAMSULOSIN HCL 0.4 MG PO CAPS: 0.4000 mg | ORAL_CAPSULE | Freq: Every day | ORAL | 1 refills | Status: AC

## 2021-02-16 MED ORDER — ALPRAZOLAM 0.5 MG PO TABS: 0.5000 mg | ORAL_TABLET | Freq: Every evening | ORAL | 5 refills | Status: DC | PRN

## 2021-02-16 MED ORDER — ROSUVASTATIN CALCIUM 10 MG PO TABS: ORAL_TABLET | ORAL | 1 refills | Status: AC

## 2021-02-16 MED ORDER — AMLODIPINE BESY-BENAZEPRIL HCL 10-20 MG PO CAPS: 1.0000 | ORAL_CAPSULE | Freq: Every day | ORAL | 1 refills | Status: AC

## 2021-02-16 MED ORDER — ALPRAZOLAM 0.5 MG PO TABS: ORAL_TABLET | ORAL | 5 refills | Status: DC

## 2021-02-16 NOTE — Patient Instructions (Signed)

## 2021-02-16 NOTE — Progress Notes (Signed)
Subjective:  Patient ID: Mark Jordan, male    DOB: Jan 11, 1941  Age: 81 y.o. MRN: [id number]  CC: Hypertension  This visit occurred during the SARS-CoV-2 public health emergency.  Safety protocols were in place, including screening questions prior to the visit, additional usage of staff PPE, and extensive cleaning of exam room while observing appropriate contact time as indicated for disinfecting solutions.    HPI Mark Jordan presents for f/up -   He is active and denies chest pain, shortness of breath, diaphoresis, dizziness, lightheadedness, palpitations, edema, or fatigue.  Outpatient Medications Prior to Visit  Medication Sig Dispense Refill   finasteride (PROSCAR) 5 MG tablet TAKE ONE (1) TABLET ONCE DAILY     Omega-3 Fatty Acids (FISH OIL) 1000 MG CPDR Take 1,000 mcg by mouth daily.     omeprazole (PRILOSEC) 40 MG capsule TAKE ONE (1) CAPSULE EACH DAY 90 capsule 1   ALPRAZolam (XANAX) 0.5 MG tablet TAKE 1/2-1 TABLET BY MOUTH ONCE DAILY ATBEDTIME AS NEEDED ONLY. 30 tablet 5   amLODipine-benazepril (LOTREL) 10-20 MG capsule Take 1 capsule by mouth daily. 90 capsule 1   rosuvastatin (CRESTOR) 10 MG tablet TAKE ONE (1) TABLET ONCE DAILY 90 tablet 1   Tamsulosin HCl (FLOMAX) 0.4 MG CAPS Take 0.4 mg by mouth daily after supper.      No facility-administered medications prior to visit.    ROS Review of Systems  Constitutional:  Negative for diaphoresis, fatigue and unexpected weight change.  HENT: Negative.    Eyes: Negative.   Respiratory:  Negative for cough, chest tightness, shortness of breath and wheezing.   Cardiovascular:  Negative for chest pain, palpitations and leg swelling.  Gastrointestinal:  Negative for abdominal pain, diarrhea, nausea and vomiting.  Endocrine: Negative.   Genitourinary: Negative.  Negative for difficulty urinating.  Musculoskeletal: Negative.   Skin:  Negative for color change.  Neurological: Negative.  Negative for dizziness, weakness and  light-headedness.  Hematological:  Negative for adenopathy. Does not bruise/bleed easily.  Psychiatric/Behavioral:  Negative for confusion, decreased concentration, dysphoric mood and sleep disturbance. The patient is nervous/anxious.    Objective:  BP (!) 142/82 (BP Location: Left Arm, Patient Position: Sitting, Cuff Size: Large)    Pulse 76    Temp 98.4 F (36.9 C) (Oral)    Resp 16    Ht 5\' 8"  (1.727 m)    Wt 154 lb (69.9 kg)    SpO2 96%    BMI 23.42 kg/m   BP Readings from Last 3 Encounters:  02/16/21 (!) 142/82  08/14/20 (!) 164/88  08/09/19 122/78    Wt Readings from Last 3 Encounters:  02/16/21 154 lb (69.9 kg)  08/14/20 159 lb (72.1 kg)  08/09/19 155 lb (70.3 kg)    Physical Exam Vitals reviewed.  Constitutional:      Appearance: He is not ill-appearing.  HENT:     Nose: Nose normal.     Mouth/Throat:     Mouth: Mucous membranes are moist.  Eyes:     General: No scleral icterus.    Conjunctiva/sclera: Conjunctivae normal.  Cardiovascular:     Rate and Rhythm: Normal rate and regular rhythm.     Heart sounds: No murmur heard. Pulmonary:     Effort: Pulmonary effort is normal.     Breath sounds: No stridor. No wheezing, rhonchi or rales.  Abdominal:     General: Abdomen is flat.     Palpations: There is no mass.     Tenderness: There  is no abdominal tenderness. There is no guarding.     Hernia: No hernia is present.  Musculoskeletal:     Cervical back: Neck supple.  Lymphadenopathy:     Cervical: No cervical adenopathy.  Skin:    General: Skin is warm and dry.     Findings: No rash.  Neurological:     General: No focal deficit present.     Mental Status: He is alert. Mental status is at baseline.  Psychiatric:        Mood and Affect: Mood normal.        Behavior: Behavior normal.    Lab Results  Component Value Date   WBC 5.0 02/16/2021   HGB 14.2 02/16/2021   HCT 44.5 02/16/2021   PLT 191.0 02/16/2021   GLUCOSE 110 (H) 02/16/2021   CHOL 192  08/14/2020   TRIG 78.0 08/14/2020   HDL 43.30 08/14/2020   LDLDIRECT 144.2 02/07/2013   LDLCALC 133 (H) 08/14/2020   ALT 18 08/14/2020   AST 18 08/14/2020   NA 141 02/16/2021   K 4.0 02/16/2021   CL 107 02/16/2021   CREATININE 0.63 02/16/2021   BUN 25 (H) 02/16/2021   CO2 27 02/16/2021   TSH 2.43 08/14/2020   PSA 0.97 05/30/2018   INR 0.97 09/10/2009   HGBA1C 6.4 02/16/2021    DG Chest Portable 1 View  Result Date: 02/17/2019 CLINICAL DATA:  81 year old male with history of cough for 1 week. Shortness of breath. EXAM: PORTABLE CHEST 1 VIEW COMPARISON:  Chest x-[place] 09/30/2018. FINDINGS: Lung volumes are normal. No consolidative airspace disease. No pleural effusions. No evidence of pulmonary edema. Heart size is normal. The patient is rotated to the right on today's exam, resulting in distortion of the mediastinal contours and reduced diagnostic sensitivity and specificity for mediastinal pathology. Atherosclerotic calcifications in the thoracic aorta. Left shoulder arthroplasty. IMPRESSION: 1. No radiographic evidence of acute cardiopulmonary disease. 2. Aortic atherosclerosis. Electronically Signed   By: Vinnie Langton M.D.   On: 02/17/2019 11:38    Assessment & Plan:   Hervey was seen today for hypertension.  Diagnoses and all orders for this visit:  Essential hypertension- His blood pressure is well controlled.  Will continue the combination of amlodipine and benazepril. -     amLODipine-benazepril (LOTREL) 10-20 MG capsule; Take 1 capsule by mouth daily. -     Basic metabolic panel; Future -     CBC with Differential/Platelet; Future -     CBC with Differential/Platelet -     Basic metabolic panel  Prediabetes- His A1c is up to 6.4%.  Medical therapy is not yet indicated. -     Basic metabolic panel; Future -     Hemoglobin A1c; Future -     Hemoglobin A1c -     Basic metabolic panel  Hyperlipidemia with target LDL less than 100- LDL goal achieved. Doing well on the  statin  -     rosuvastatin (CRESTOR) 10 MG tablet; TAKE ONE (1) TABLET ONCE DAILY  GAD (generalized anxiety disorder) -     Discontinue: ALPRAZolam (XANAX) 0.5 MG tablet; TAKE 1/2-1 TABLET BY MOUTH ONCE DAILY ATBEDTIME AS NEEDED ONLY. Strength: 0.5 mg -     ALPRAZolam (XANAX) 0.5 MG tablet; Take 1 tablet (0.5 mg total) by mouth at bedtime as needed for anxiety.  BPH with obstruction/lower urinary tract symptoms -     tamsulosin (FLOMAX) 0.4 MG CAPS capsule; Take 1 capsule (0.4 mg total) by mouth daily after supper.  Flu vaccine need -     Flu Vaccine QUAD High Dose(Fluad)  Other orders -     Pneumococcal polysaccharide vaccine 23-valent greater than or equal to 2yo subcutaneous/IM   I have discontinued [place] V. Boltz's ALPRAZolam. I have also changed his tamsulosin and ALPRAZolam. Additionally, I am having him maintain his Fish Oil, finasteride, omeprazole, amLODipine-benazepril, and rosuvastatin.  Meds ordered this encounter  Medications   amLODipine-benazepril (LOTREL) 10-20 MG capsule    Sig: Take 1 capsule by mouth daily.    Dispense:  90 capsule    Refill:  1   rosuvastatin (CRESTOR) 10 MG tablet    Sig: TAKE ONE (1) TABLET ONCE DAILY    Dispense:  90 tablet    Refill:  1   DISCONTD: ALPRAZolam (XANAX) 0.5 MG tablet    Sig: TAKE 1/2-1 TABLET BY MOUTH ONCE DAILY ATBEDTIME AS NEEDED ONLY. Strength: 0.5 mg    Dispense:  30 tablet    Refill:  5   tamsulosin (FLOMAX) 0.4 MG CAPS capsule    Sig: Take 1 capsule (0.4 mg total) by mouth daily after supper.    Dispense:  90 capsule    Refill:  1   ALPRAZolam (XANAX) 0.5 MG tablet    Sig: Take 1 tablet (0.5 mg total) by mouth at bedtime as needed for anxiety.    Dispense:  30 tablet    Refill:  5     Follow-up: Return in about 6 months (around 08/16/2021).  Scarlette Calico, MD

## 2021-02-18 DIAGNOSIS — K623 Rectal prolapse: Secondary | ICD-10-CM | POA: Diagnosis not present

## 2021-02-18 DIAGNOSIS — Z86010 Personal history of colonic polyps: Secondary | ICD-10-CM | POA: Diagnosis not present

## 2021-02-26 DIAGNOSIS — N2889 Other specified disorders of kidney and ureter: Secondary | ICD-10-CM | POA: Diagnosis not present

## 2021-02-26 DIAGNOSIS — M546 Pain in thoracic spine: Secondary | ICD-10-CM | POA: Diagnosis not present

## 2021-02-26 DIAGNOSIS — Z79899 Other long term (current) drug therapy: Secondary | ICD-10-CM | POA: Diagnosis not present

## 2021-02-26 DIAGNOSIS — J9811 Atelectasis: Secondary | ICD-10-CM | POA: Diagnosis not present

## 2021-02-26 DIAGNOSIS — R079 Chest pain, unspecified: Secondary | ICD-10-CM | POA: Diagnosis not present

## 2021-02-26 DIAGNOSIS — N200 Calculus of kidney: Secondary | ICD-10-CM | POA: Diagnosis not present

## 2021-02-26 DIAGNOSIS — M47814 Spondylosis without myelopathy or radiculopathy, thoracic region: Secondary | ICD-10-CM | POA: Diagnosis not present

## 2021-02-26 DIAGNOSIS — I2510 Atherosclerotic heart disease of native coronary artery without angina pectoris: Secondary | ICD-10-CM | POA: Diagnosis not present

## 2021-02-26 DIAGNOSIS — I7123 Aneurysm of the descending thoracic aorta, without rupture: Secondary | ICD-10-CM | POA: Diagnosis not present

## 2021-02-26 DIAGNOSIS — R0789 Other chest pain: Secondary | ICD-10-CM | POA: Diagnosis not present

## 2021-02-26 DIAGNOSIS — J219 Acute bronchiolitis, unspecified: Secondary | ICD-10-CM | POA: Diagnosis not present

## 2021-02-26 DIAGNOSIS — N3289 Other specified disorders of bladder: Secondary | ICD-10-CM | POA: Diagnosis not present

## 2021-02-26 DIAGNOSIS — N400 Enlarged prostate without lower urinary tract symptoms: Secondary | ICD-10-CM | POA: Diagnosis not present

## 2021-02-26 DIAGNOSIS — K5730 Diverticulosis of large intestine without perforation or abscess without bleeding: Secondary | ICD-10-CM | POA: Diagnosis not present

## 2021-02-27 ENCOUNTER — Ambulatory Visit: Payer: Medicare HMO

## 2021-03-31 DIAGNOSIS — Z1211 Encounter for screening for malignant neoplasm of colon: Secondary | ICD-10-CM | POA: Diagnosis not present

## 2021-03-31 DIAGNOSIS — Z86010 Personal history of colonic polyps: Secondary | ICD-10-CM | POA: Diagnosis not present

## 2021-03-31 DIAGNOSIS — K5730 Diverticulosis of large intestine without perforation or abscess without bleeding: Secondary | ICD-10-CM | POA: Diagnosis not present

## 2021-05-07 DIAGNOSIS — R339 Retention of urine, unspecified: Secondary | ICD-10-CM | POA: Diagnosis not present

## 2021-05-07 DIAGNOSIS — N200 Calculus of kidney: Secondary | ICD-10-CM | POA: Diagnosis not present

## 2021-05-07 DIAGNOSIS — N400 Enlarged prostate without lower urinary tract symptoms: Secondary | ICD-10-CM | POA: Diagnosis not present

## 2021-05-07 DIAGNOSIS — N323 Diverticulum of bladder: Secondary | ICD-10-CM | POA: Diagnosis not present

## 2021-05-07 DIAGNOSIS — N35819 Other urethral stricture, male, unspecified site: Secondary | ICD-10-CM | POA: Diagnosis not present

## 2021-05-07 DIAGNOSIS — N281 Cyst of kidney, acquired: Secondary | ICD-10-CM | POA: Diagnosis not present

## 2021-06-16 ENCOUNTER — Ambulatory Visit (INDEPENDENT_AMBULATORY_CARE_PROVIDER_SITE_OTHER): Payer: Medicare HMO | Admitting: Internal Medicine

## 2021-06-16 VITALS — BP 118/78 | HR 74 | Temp 98.2°F | Ht 68.0 in | Wt 152.0 lb

## 2021-06-16 DIAGNOSIS — F411 Generalized anxiety disorder: Secondary | ICD-10-CM | POA: Diagnosis not present

## 2021-06-16 DIAGNOSIS — K2100 Gastro-esophageal reflux disease with esophagitis, without bleeding: Secondary | ICD-10-CM

## 2021-06-16 DIAGNOSIS — Z23 Encounter for immunization: Secondary | ICD-10-CM

## 2021-06-16 DIAGNOSIS — I10 Essential (primary) hypertension: Secondary | ICD-10-CM

## 2021-06-16 MED ORDER — BOOSTRIX 5-2.5-18.5 LF-MCG/0.5 IM SUSP: 0.5000 mL | Freq: Once | INTRAMUSCULAR | 0 refills | Status: AC

## 2021-06-16 MED ORDER — SHINGRIX 50 MCG/0.5ML IM SUSR: 0.5000 mL | Freq: Once | INTRAMUSCULAR | 1 refills | Status: AC

## 2021-06-16 MED ORDER — PANTOPRAZOLE SODIUM 40 MG PO TBEC: 40.0000 mg | DELAYED_RELEASE_TABLET | Freq: Every day | ORAL | 1 refills | Status: AC

## 2021-06-16 MED ORDER — ALPRAZOLAM 0.5 MG PO TABS: 0.5000 mg | ORAL_TABLET | Freq: Every evening | ORAL | 5 refills | Status: AC | PRN

## 2021-06-16 NOTE — Progress Notes (Signed)
? ?Subjective:  ?Patient ID: Mark Jordan, male    DOB: 1940-12-19  Age: 81 y.o. MRN: [id number] ? ?CC: Gastroesophageal Reflux and Hypertension ? ? ?HPI ?Mark Jordan presents for f/up -  ? ?He continues to complain of heartburn and would like to switch omeprazole to pantoprazole.  He denies odynophagia, dysphagia, chest pain, abdominal pain, or melena.  He tells me that 2 months ago he developed low back pain and was seen at an urgent care center in [place].  He was eventually referred to the hospital in [place] where he describes having a CT scan and EKG done.  He says everything came out okay. ? ?Outpatient Medications Prior to Visit  ?Medication Sig Dispense Refill  ? amLODipine-benazepril (LOTREL) 10-20 MG capsule Take 1 capsule by mouth daily. 90 capsule 1  ? finasteride (PROSCAR) 5 MG tablet TAKE ONE (1) TABLET ONCE DAILY    ? Omega-3 Fatty Acids (FISH OIL) 1000 MG CPDR Take 1,000 mcg by mouth daily.    ? rosuvastatin (CRESTOR) 10 MG tablet TAKE ONE (1) TABLET ONCE DAILY 90 tablet 1  ? tamsulosin (FLOMAX) 0.4 MG CAPS capsule Take 1 capsule (0.4 mg total) by mouth daily after supper. 90 capsule 1  ? ALPRAZolam (XANAX) 0.5 MG tablet Take 1 tablet (0.5 mg total) by mouth at bedtime as needed for anxiety. 30 tablet 5  ? omeprazole (PRILOSEC) 40 MG capsule TAKE ONE (1) CAPSULE EACH DAY 90 capsule 1  ? ?No facility-administered medications prior to visit.  ? ? ?ROS ?Review of Systems  ?Constitutional:  Negative for diaphoresis and fatigue.  ?HENT: Negative.  Negative for trouble swallowing.   ?Eyes: Negative.   ?Respiratory:  Negative for cough, chest tightness, shortness of breath and wheezing.   ?Cardiovascular:  Negative for chest pain, palpitations and leg swelling.  ?Gastrointestinal:  Negative for abdominal pain, constipation, diarrhea, nausea and vomiting.  ?Endocrine: Negative.   ?Genitourinary: Negative.  Negative for difficulty urinating and dysuria.  ?Musculoskeletal:  Positive for back pain.  Negative for arthralgias and myalgias.  ?Skin: Negative.   ?Neurological:  Negative for dizziness, weakness, light-headedness and headaches.  ?Hematological:  Negative for adenopathy. Does not bruise/bleed easily.  ?Psychiatric/Behavioral:  Negative for confusion, decreased concentration, dysphoric mood, sleep disturbance and suicidal ideas. The patient is nervous/anxious.   ? ?Objective:  ?BP 118/78 (BP Location: Left Arm, Patient Position: Sitting, Cuff Size: Large)   Pulse 74   Temp 98.2 ?F (36.8 ?C) (Oral)   Ht '5\' 8"'$  (1.727 m)   Wt 152 lb (68.9 kg)   SpO2 94%   BMI 23.11 kg/m?  ? ?BP Readings from Last 3 Encounters:  ?06/16/21 118/78  ?02/16/21 (!) 142/82  ?08/14/20 (!) 164/88  ? ? ?Wt Readings from Last 3 Encounters:  ?06/16/21 152 lb (68.9 kg)  ?02/16/21 154 lb (69.9 kg)  ?08/14/20 159 lb (72.1 kg)  ? ? ?Physical Exam ?Vitals reviewed.  ?HENT:  ?   Nose: Nose normal.  ?   Mouth/Throat:  ?   Mouth: Mucous membranes are moist.  ?Eyes:  ?   General: No scleral icterus. ?   Conjunctiva/sclera: Conjunctivae normal.  ?Cardiovascular:  ?   Rate and Rhythm: Normal rate and regular rhythm.  ?   Heart sounds: No murmur heard. ?Pulmonary:  ?   Effort: Pulmonary effort is normal.  ?   Breath sounds: No stridor. No wheezing, rhonchi or rales.  ?Abdominal:  ?   General: Abdomen is flat.  ?   Palpations: There  is no mass.  ?   Tenderness: There is no abdominal tenderness. There is no guarding.  ?   Hernia: No hernia is present.  ?Musculoskeletal:     ?   General: Normal range of motion.  ?   Cervical back: Neck supple.  ?   Right lower leg: No edema.  ?Lymphadenopathy:  ?   Cervical: No cervical adenopathy.  ?Skin: ?   General: Skin is warm.  ?Neurological:  ?   General: No focal deficit present.  ?   Mental Status: He is alert. Mental status is at baseline.  ?Psychiatric:     ?   Attention and Perception: Attention and perception normal.     ?   Mood and Affect: Mood is anxious. Mood is not depressed.     ?   Speech:  Speech normal.     ?   Behavior: Behavior normal.  ? ? ?Lab Results  ?Component Value Date  ? WBC 5.0 02/16/2021  ? HGB 14.2 02/16/2021  ? HCT 44.5 02/16/2021  ? PLT 191.0 02/16/2021  ? GLUCOSE 110 (H) 02/16/2021  ? CHOL 192 08/14/2020  ? TRIG 78.0 08/14/2020  ? HDL 43.30 08/14/2020  ? LDLDIRECT 144.2 02/07/2013  ? LDLCALC 133 (H) 08/14/2020  ? ALT 18 08/14/2020  ? AST 18 08/14/2020  ? NA 141 02/16/2021  ? K 4.0 02/16/2021  ? CL 107 02/16/2021  ? CREATININE 0.63 02/16/2021  ? BUN 25 (H) 02/16/2021  ? CO2 27 02/16/2021  ? TSH 2.43 08/14/2020  ? PSA 0.97 05/30/2018  ? INR 0.97 09/10/2009  ? HGBA1C 6.4 02/16/2021  ? ? ?DG Chest Portable 1 View ? ?Result Date: 02/17/2019 ?CLINICAL DATA:  81 year old male with history of cough for 1 week. Shortness of breath. EXAM: PORTABLE CHEST 1 VIEW COMPARISON:  Chest x-[place] 09/30/2018. FINDINGS: Lung volumes are normal. No consolidative airspace disease. No pleural effusions. No evidence of pulmonary edema. Heart size is normal. The patient is rotated to the right on today's exam, resulting in distortion of the mediastinal contours and reduced diagnostic sensitivity and specificity for mediastinal pathology. Atherosclerotic calcifications in the thoracic aorta. Left shoulder arthroplasty. IMPRESSION: 1. No radiographic evidence of acute cardiopulmonary disease. 2. Aortic atherosclerosis. Electronically Signed   By: Vinnie Langton M.D.   On: 02/17/2019 11:38  ? ? ?Assessment & Plan:  ? ?Mark Jordan was seen today for gastroesophageal reflux and hypertension. ? ?Diagnoses and all orders for this visit: ? ?Essential hypertension- His BP is well controlled. ? ?GAD (generalized anxiety disorder) ?-     ALPRAZolam (XANAX) 0.5 MG tablet; Take 1 tablet (0.5 mg total) by mouth at bedtime as needed for anxiety. ? ?Gastroesophageal reflux disease with esophagitis without hemorrhage ?-     pantoprazole (PROTONIX) 40 MG tablet; Take 1 tablet (40 mg total) by mouth daily. ? ?Need for prophylactic  vaccination and inoculation against varicella ?-     Zoster Vaccine Adjuvanted [facility]) injection; Inject 0.5 mLs into the muscle once for 1 dose. ? ?Need for prophylactic vaccination with combined diphtheria-tetanus-pertussis (DTP) vaccine ?-     Tdap (BOOSTRIX) 5-2.5-18.5 LF-MCG/0.5 injection; Inject 0.5 mLs into the muscle once for 1 dose. ? ? ?I have discontinued [facility] V. Yapp's omeprazole. I am also having him start on pantoprazole, Shingrix, and Boostrix. Additionally, I am having him maintain his Fish Oil, finasteride, amLODipine-benazepril, rosuvastatin, tamsulosin, and ALPRAZolam. ? ?Meds ordered this encounter  ?Medications  ? ALPRAZolam (XANAX) 0.5 MG tablet  ?  Sig: Take 1  tablet (0.5 mg total) by mouth at bedtime as needed for anxiety.  ?  Dispense:  30 tablet  ?  Refill:  5  ? pantoprazole (PROTONIX) 40 MG tablet  ?  Sig: Take 1 tablet (40 mg total) by mouth daily.  ?  Dispense:  90 tablet  ?  Refill:  1  ? Zoster Vaccine Adjuvanted [facility]) injection  ?  Sig: Inject 0.5 mLs into the muscle once for 1 dose.  ?  Dispense:  0.5 mL  ?  Refill:  1  ? Tdap (BOOSTRIX) 5-2.5-18.5 LF-MCG/0.5 injection  ?  Sig: Inject 0.5 mLs into the muscle once for 1 dose.  ?  Dispense:  0.5 mL  ?  Refill:  0  ? ? ? ?Follow-up: No follow-ups on file. ? ?Scarlette Calico, MD ?

## 2021-06-17 ENCOUNTER — Encounter: Payer: Self-pay | Admitting: Internal Medicine

## 2021-06-17 NOTE — Patient Instructions (Signed)
Gastroesophageal Reflux Disease, Adult Gastroesophageal reflux (GER) happens when acid from the stomach flows up into the tube that connects the mouth and the stomach (esophagus). Normally, food travels down the esophagus and stays in the stomach to be digested. However, when a person has GER, food and stomach acid sometimes move back up into the esophagus. If this becomes a more serious problem, the person may be diagnosed with a disease called gastroesophageal reflux disease (GERD). GERD occurs when the reflux: Happens often. Causes frequent or severe symptoms. Causes problems such as damage to the esophagus. When stomach acid comes in contact with the esophagus, the acid may cause inflammation in the esophagus. Over time, GERD may create small holes (ulcers) in the lining of the esophagus. What are the causes? This condition is caused by a problem with the muscle between the esophagus and the stomach (lower esophageal sphincter, or LES). Normally, the LES muscle closes after food passes through the esophagus to the stomach. When the LES is weakened or abnormal, it does not close properly, and that allows food and stomach acid to go back up into the esophagus. The LES can be weakened by certain dietary substances, medicines, and medical conditions, including: Tobacco use. Pregnancy. Having a hiatal hernia. Alcohol use. Certain foods and beverages, such as coffee, chocolate, onions, and peppermint. What increases the risk? You are more likely to develop this condition if you: Have an increased body weight. Have a connective tissue disorder. Take NSAIDs, such as ibuprofen. What are the signs or symptoms? Symptoms of this condition include: Heartburn. Difficult or painful swallowing and the feeling of having a lump in the throat. A bitter taste in the mouth. Bad breath and having a large amount of saliva. Having an upset or bloated stomach and belching. Chest pain. Different conditions can  cause chest pain. Make sure you see your health care provider if you experience chest pain. Shortness of breath or wheezing. Ongoing (chronic) cough or a nighttime cough. Wearing away of tooth enamel. Weight loss. How is this diagnosed? This condition may be diagnosed based on a medical history and a physical exam. To determine if you have mild or severe GERD, your health care provider may also monitor how you respond to treatment. You may also have tests, including: A test to examine your stomach and esophagus with a small camera (endoscopy). A test that measures the acidity level in your esophagus. A test that measures how much pressure is on your esophagus. A barium swallow or modified barium swallow test to show the shape, size, and functioning of your esophagus. How is this treated? Treatment for this condition may vary depending on how severe your symptoms are. Your health care provider may recommend: Changes to your diet. Medicine. Surgery. The goal of treatment is to help relieve your symptoms and to prevent complications. Follow these instructions at home: Eating and drinking  Follow a diet as recommended by your health care provider. This may involve avoiding foods and drinks such as: Coffee and tea, with or without caffeine. Drinks that contain alcohol. Energy drinks and sports drinks. Carbonated drinks or sodas. Chocolate and cocoa. Peppermint and mint flavorings. Garlic and onions. Horseradish. Spicy and acidic foods, including peppers, chili powder, curry powder, vinegar, hot sauces, and barbecue sauce. Citrus fruit juices and citrus fruits, such as oranges, lemons, and limes. Tomato-based foods, such as red sauce, chili, salsa, and pizza with red sauce. Fried and fatty foods, such as donuts, french fries, potato chips, and high-fat dressings.   High-fat meats, such as hot dogs and fatty cuts of red and white meats, such as rib eye steak, sausage, ham, and  bacon. High-fat dairy items, such as whole milk, butter, and cream cheese. Eat small, frequent meals instead of large meals. Avoid drinking large amounts of liquid with your meals. Avoid eating meals during the 2-3 hours before bedtime. Avoid lying down right after you eat. Do not exercise right after you eat. Lifestyle  Do not use any products that contain nicotine or tobacco. These products include cigarettes, chewing tobacco, and vaping devices, such as e-cigarettes. If you need help quitting, ask your health care provider. Try to reduce your stress by using methods such as yoga or meditation. If you need help reducing stress, ask your health care provider. If you are overweight, reduce your weight to an amount that is healthy for you. Ask your health care provider for guidance about a safe weight loss goal. General instructions Pay attention to any changes in your symptoms. Take over-the-counter and prescription medicines only as told by your health care provider. Do not take aspirin, ibuprofen, or other NSAIDs unless your health care provider told you to take these medicines. Wear loose-fitting clothing. Do not wear anything tight around your waist that causes pressure on your abdomen. Raise (elevate) the head of your bed about 6 inches (15 cm). You can use a wedge to do this. Avoid bending over if this makes your symptoms worse. Keep all follow-up visits. This is important. Contact a health care provider if: You have: New symptoms. Unexplained weight loss. Difficulty swallowing or it hurts to swallow. Wheezing or a persistent cough. A hoarse voice. Your symptoms do not improve with treatment. Get help right away if: You have sudden pain in your arms, neck, jaw, teeth, or back. You suddenly feel sweaty, dizzy, or light-headed. You have chest pain or shortness of breath. You vomit and the vomit is green, yellow, or black, or it looks like blood or coffee grounds. You faint. You  have stool that is red, bloody, or black. You cannot swallow, drink, or eat. These symptoms may represent a serious problem that is an emergency. Do not wait to see if the symptoms will go away. Get medical help right away. Call your local emergency services (911 in the [place] Do not drive yourself to the hospital. Summary Gastroesophageal reflux happens when acid from the stomach flows up into the esophagus. GERD is a disease in which the reflux happens often, causes frequent or severe symptoms, or causes problems such as damage to the esophagus. Treatment for this condition may vary depending on how severe your symptoms are. Your health care provider may recommend diet and lifestyle changes, medicine, or surgery. Contact a health care provider if you have new or worsening symptoms. Take over-the-counter and prescription medicines only as told by your health care provider. Do not take aspirin, ibuprofen, or other NSAIDs unless your health care provider told you to do so. Keep all follow-up visits as told by your health care provider. This is important. This information is not intended to replace advice given to you by your health care provider. Make sure you discuss any questions you have with your health care provider. Document Revised: 07/30/2019 Document Reviewed: 07/30/2019 [employer] Patient Education  2023 [employer]  

## 2021-07-12 ENCOUNTER — Telehealth: Payer: Self-pay | Admitting: Family Medicine

## 2021-07-12 NOTE — Telephone Encounter (Signed)
Received page from [facility] indicating pt deceased, found this AM. No foul play suspected. Asking if PCP will take care of death warrant. I told him our group policy is to do so. No other questions.

## 2021-07-13 ENCOUNTER — Telehealth: Payer: Self-pay

## 2021-07-13 NOTE — Telephone Encounter (Signed)
Received triage note indicating pt deceased. Updated chart.

## 2021-08-01 DEATH — deceased
